# Patient Record
Sex: Female | Born: 1960 | Race: Black or African American | Hispanic: No | Smoking: Current every day smoker
Health system: Southern US, Community
[De-identification: ages and names within clinical notes are randomized; demographics above are authoritative.]

## PROBLEM LIST (undated history)

## (undated) DIAGNOSIS — F32A Depression, unspecified: Secondary | ICD-10-CM

## (undated) DIAGNOSIS — I10 Essential (primary) hypertension: Secondary | ICD-10-CM

## (undated) HISTORY — PX: BRAIN SURGERY: SHX531

## (undated) HISTORY — DX: Depression, unspecified: F32.A

## (undated) HISTORY — PX: TUBAL LIGATION: SHX77

## (undated) HISTORY — PX: EYE SURGERY: SHX253

---

## 2009-03-18 HISTORY — PX: BREAST BIOPSY: SHX20

## 2010-01-30 ENCOUNTER — Ambulatory Visit: Payer: Self-pay

## 2010-02-13 ENCOUNTER — Ambulatory Visit: Payer: Self-pay

## 2010-03-08 LAB — PATHOLOGY REPORT

## 2010-03-15 ENCOUNTER — Ambulatory Visit: Payer: Self-pay | Admitting: General Surgery

## 2010-09-06 ENCOUNTER — Ambulatory Visit: Payer: Self-pay

## 2011-03-05 ENCOUNTER — Ambulatory Visit: Payer: Self-pay

## 2011-04-10 ENCOUNTER — Emergency Department: Payer: Self-pay | Admitting: Emergency Medicine

## 2011-04-10 LAB — COMPREHENSIVE METABOLIC PANEL
Bilirubin,Total: 0.4 mg/dL (ref 0.2–1.0)
Chloride: 109 mmol/L — ABNORMAL HIGH (ref 98–107)
Co2: 24 mmol/L (ref 21–32)
Creatinine: 0.66 mg/dL (ref 0.60–1.30)
EGFR (African American): >60
EGFR (Non-African Amer.): >60
Osmolality: 288 (ref 275–301)
SGOT(AST): 55 U/L — ABNORMAL HIGH (ref 15–37)
SGPT (ALT): 71 U/L

## 2011-04-10 LAB — CBC
MCV: 98 fL (ref 80–100)
RBC: 4.05 10*6/uL (ref 3.80–5.20)
RDW: 14.5 % (ref 11.5–14.5)

## 2011-04-10 LAB — CK TOTAL AND CKMB (NOT AT ARMC)
CK, Total: 107 U/L (ref 21–215)
CK-MB: 1.3 ng/mL (ref 0.5–3.6)

## 2011-04-10 LAB — PRO B NATRIURETIC PEPTIDE: B-Type Natriuretic Peptide: 134 pg/mL — ABNORMAL HIGH (ref 0–125)

## 2011-08-08 IMAGING — US ULTRASOUND LEFT BREAST
1 series · 17 of 25 positions shown · non-contrast
Comparison: none

REASON FOR EXAM: LT NODULAR DENSITY [REDACTED] AV COPY TO JAVANNIE
COMMENTS:

PROCEDURE:     US  - US LT BREAST ([REDACTED])  - February 13, 2010  [DATE]
RESULT:

[Series 1: ultrasound left breast · 17 of 27 slices shown]
[im 1/27]
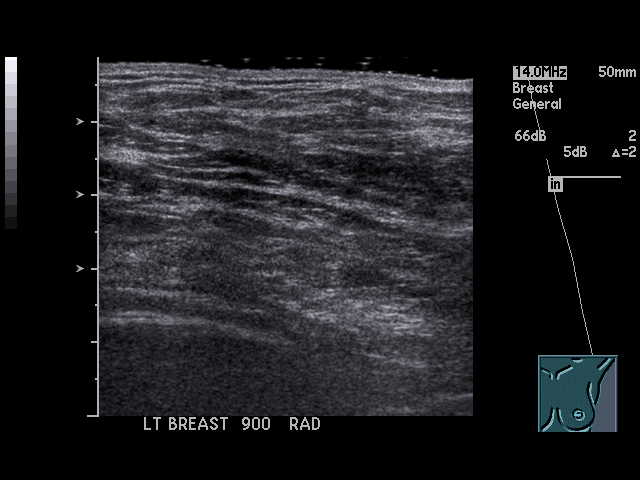
[im 3/27]
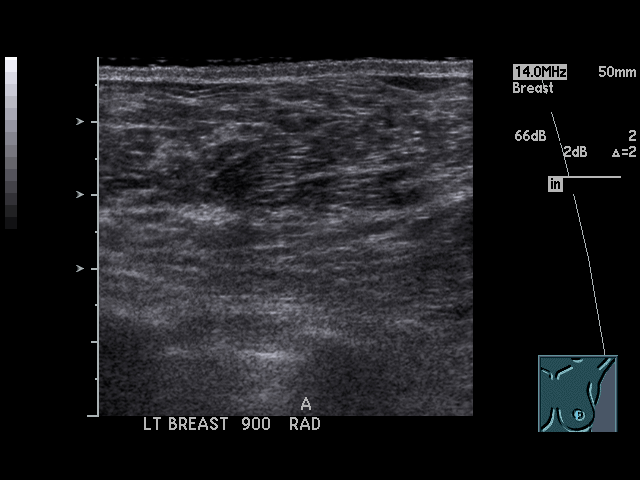
[im 4/27]
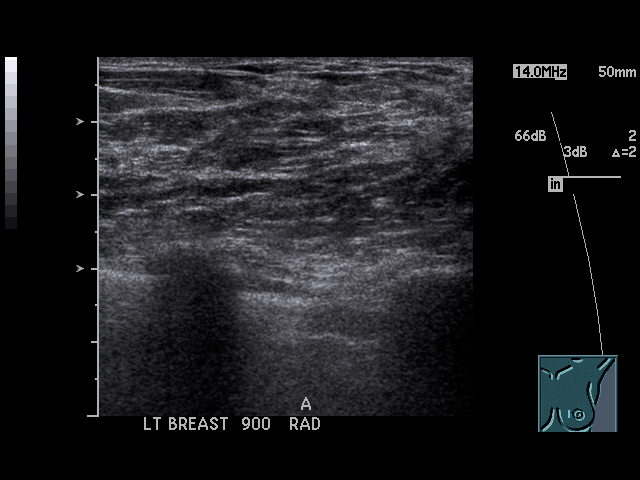
[im 6/27]
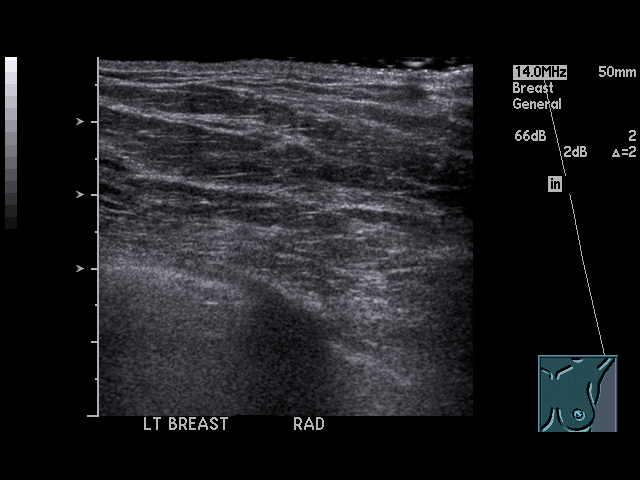
[im 7/27]
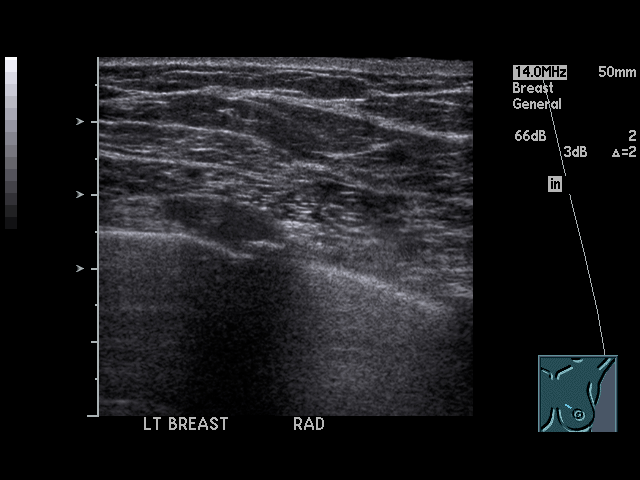
[im 9/27]
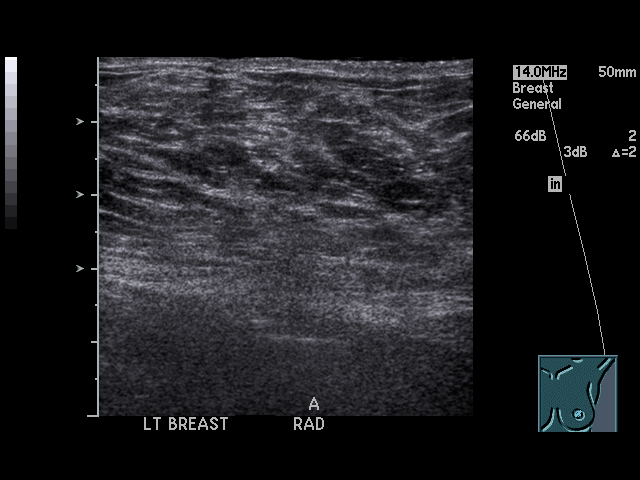
[im 10/27]
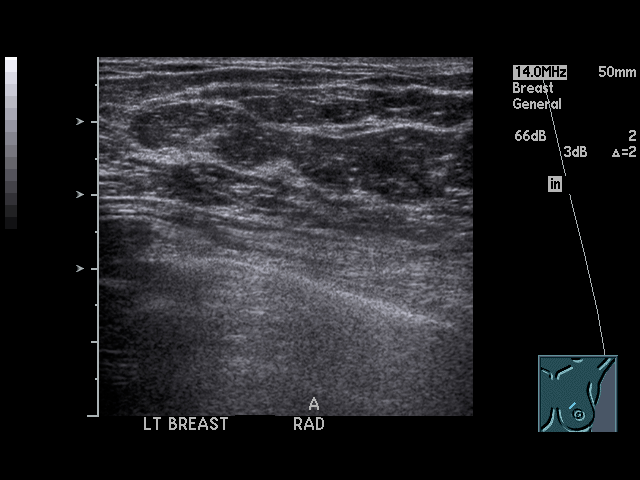
[im 12/27]
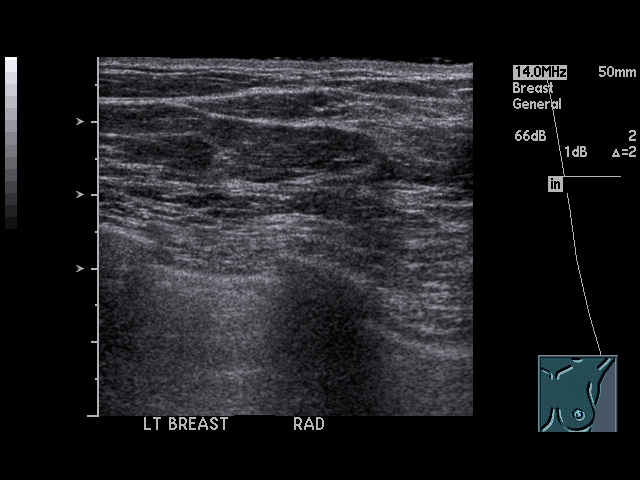
[im 14/27]
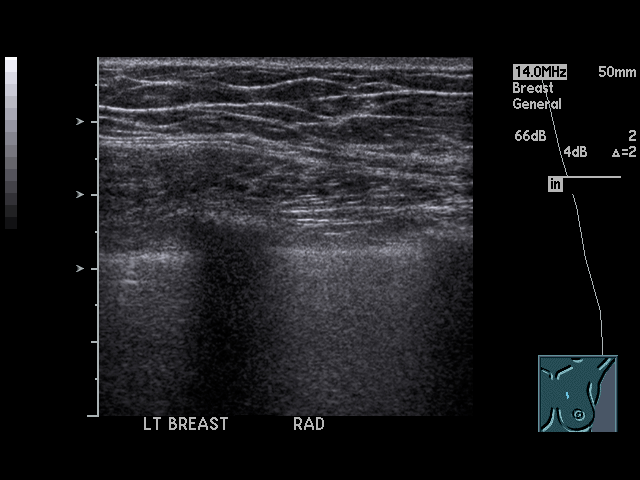
[im 15/27]
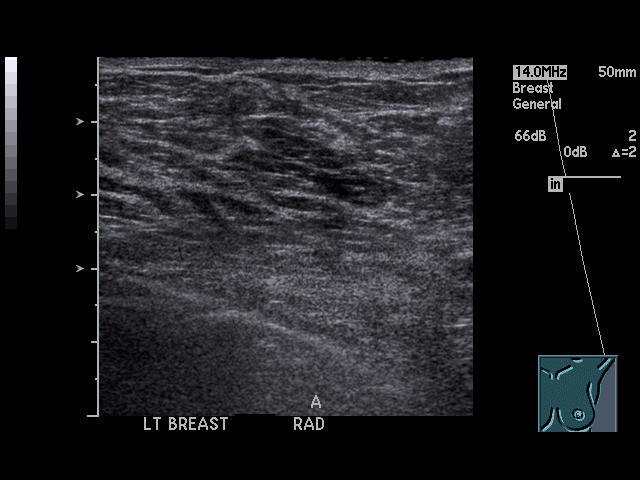
[im 17/27]
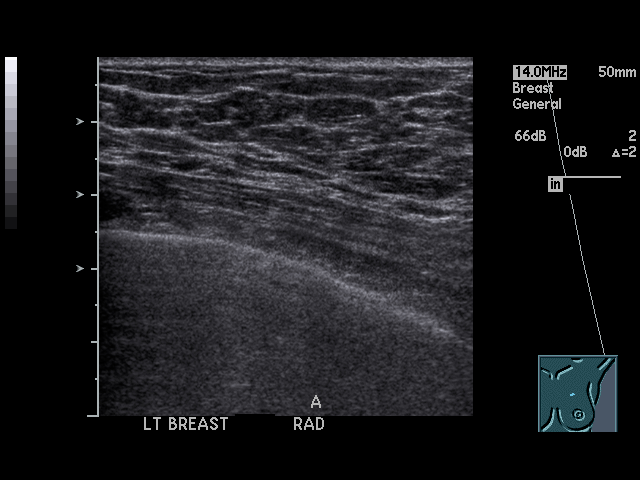
[im 18/27]
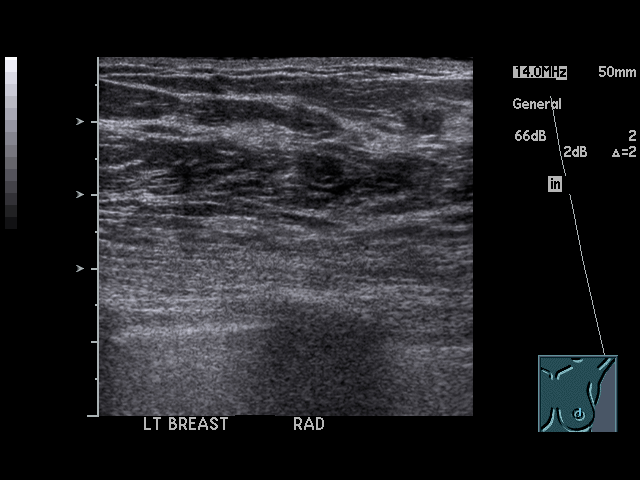
[im 20/27]
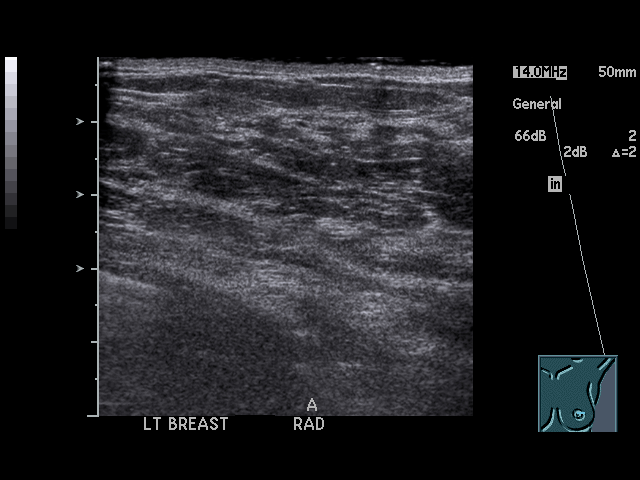
[im 21/27]
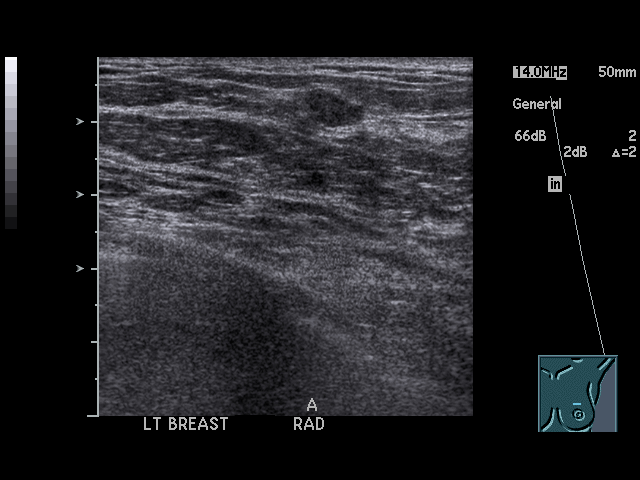
[im 23/27]
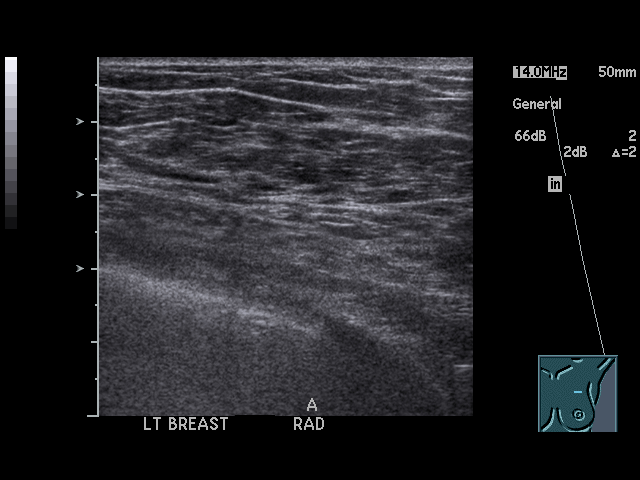
[im 24/27]
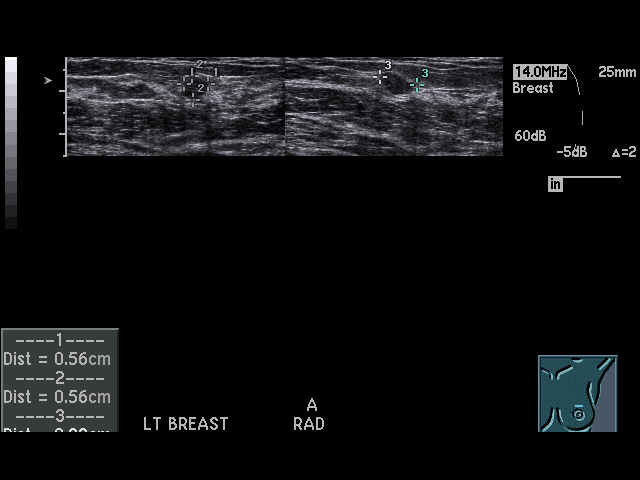
[im 27/27]
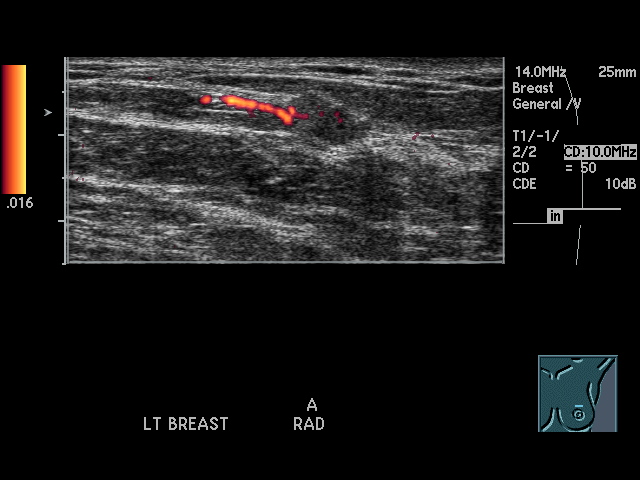

[17 of 25 positions shown; findings below may reference images not displayed]

FINDINGS: Ultrasound of the area of concern in the left breast shows a
x 0.56 x 0.88 cm, slightly irregular, hypoechoic mass in the 12 o'clock
position. There is no shadowing or through transmission and this may very
well be a benign mass. There is evidence of internal blood flow. Given the
slightly irregular margins, surgical consultation is recommended for
consideration of excision.
IMPRESSION: BI-RADS: Category 4 - Suspicious Abnormality

Thank you for this opportunity to contribute to the care of your patient.

A NEGATIVE MAMMOGRAM REPORT DOES NOT PRECLUDE BIOPSY OR OTHER EVALUATION OF
A CLINICALLY PALPABLE OR OTHERWISE SUSPICIOUS MASS OR LESION. BREAST CANCER
MAY NOT BE DETECTED BY MAMMOGRAPHY IN UP TO 10% OF CASES.

## 2011-09-07 IMAGING — US TRANSABDOMINAL ULTRASOUND OF PELVIS
1 series · 17 of 25 positions shown · non-contrast
Comparison: none

REASON FOR EXAM: postmenopausal bleeding
COMMENTS:

[Series 1: transabdominal ultrasound of pelvis · 17 of 78 slices shown]
[im 1/78]
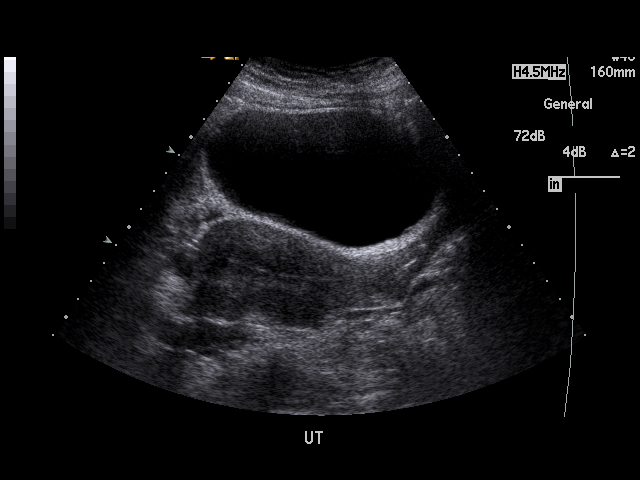
[im 7/78]
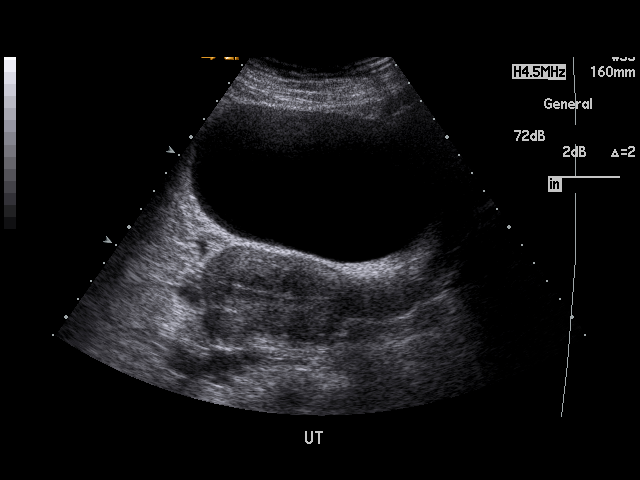
[im 10/78]
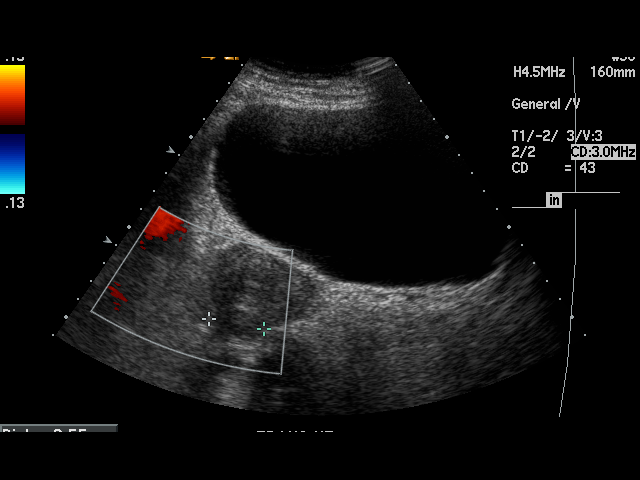
[im 17/78]
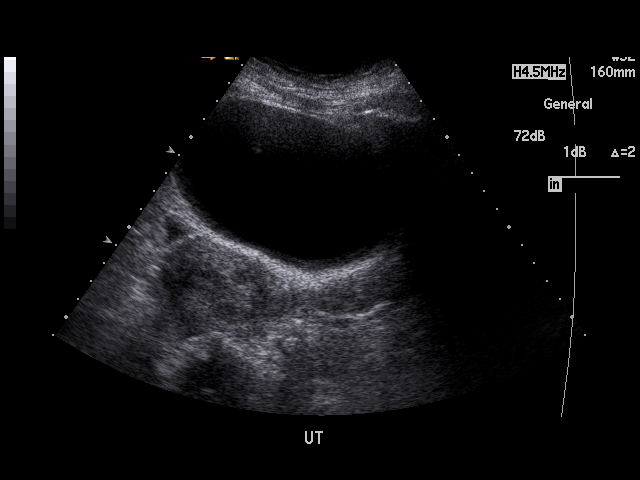
[im 20/78]
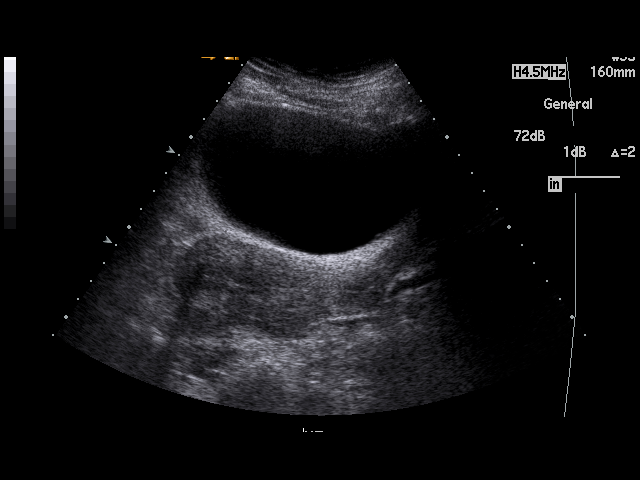
[im 26/78]
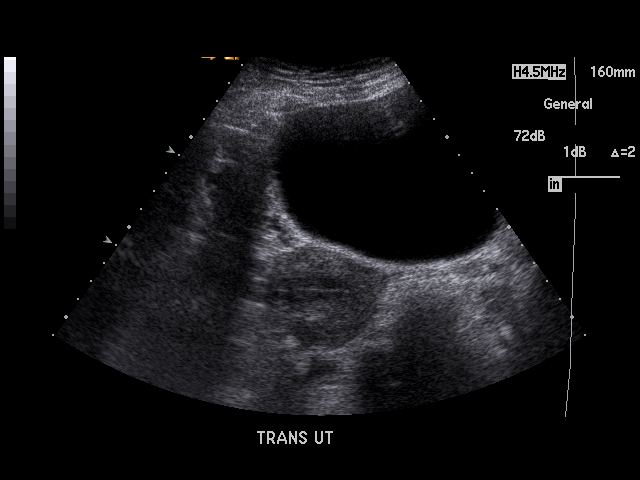
[im 29/78]
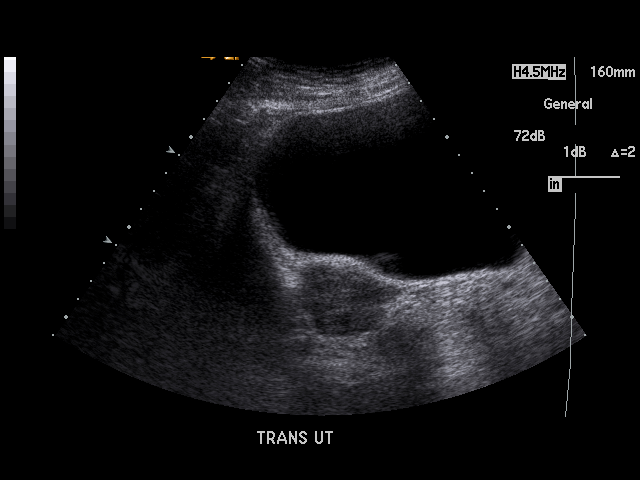
[im 36/78]
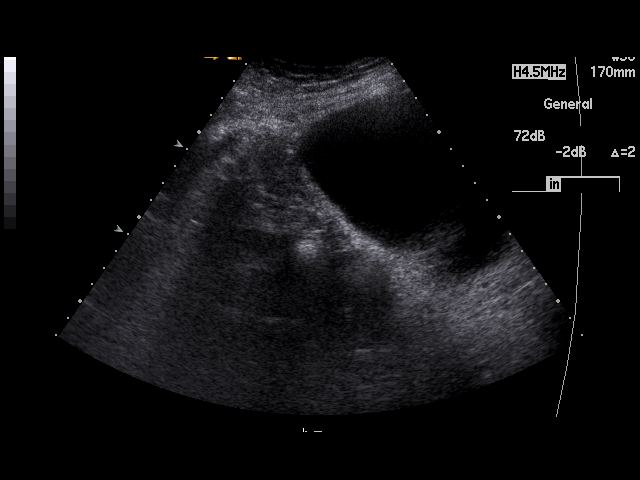
[im 39/78]
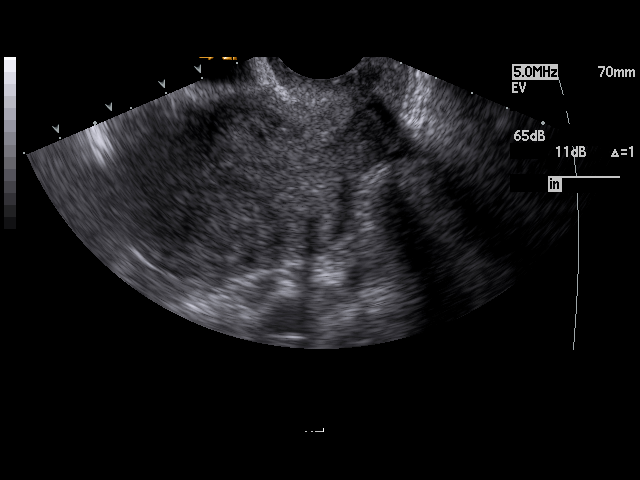
[im 42/78]
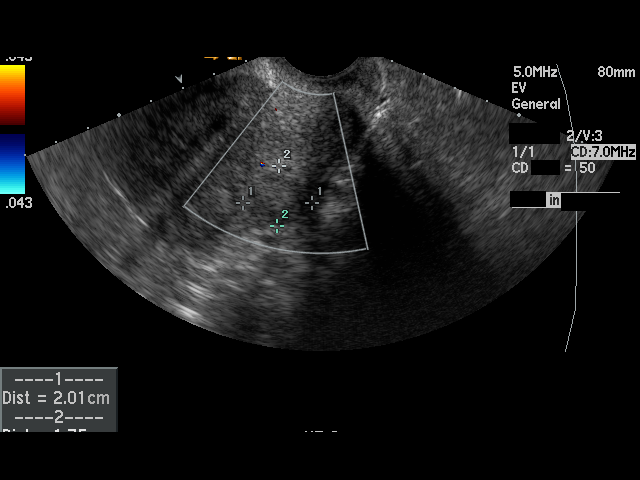
[im 49/78]
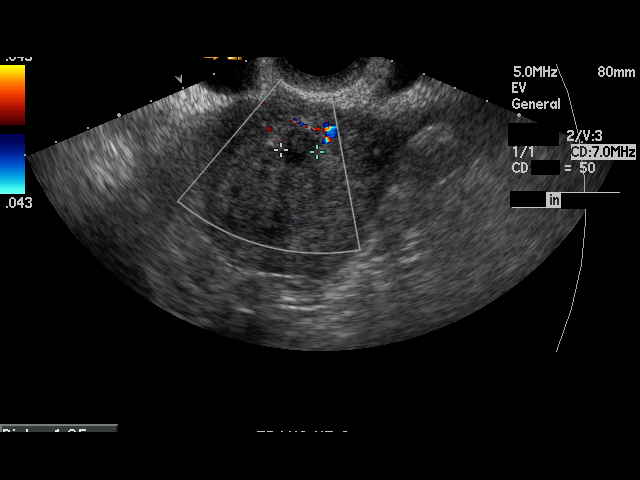
[im 52/78]
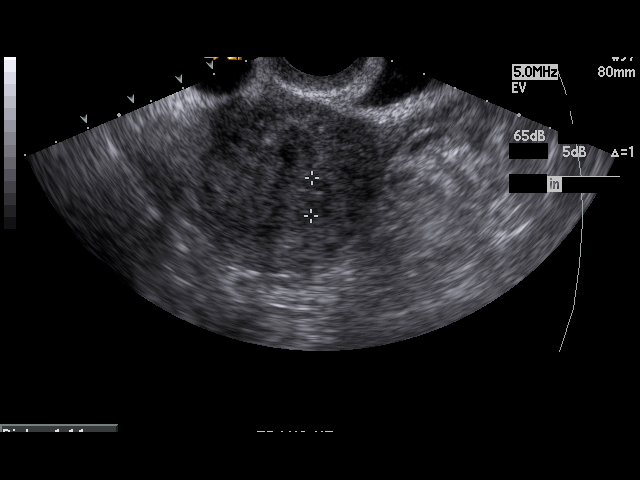
[im 58/78]
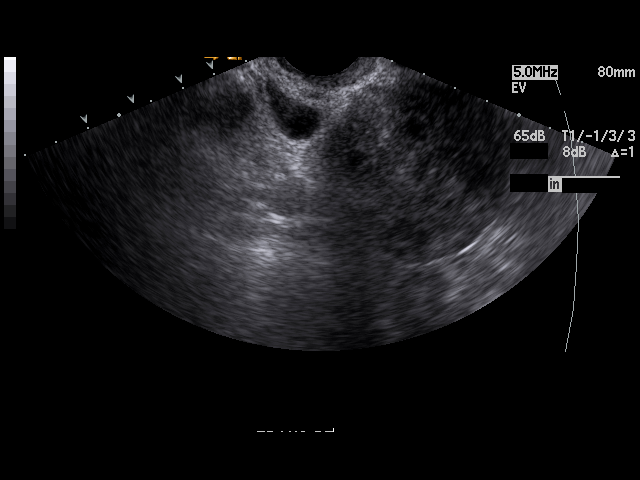
[im 61/78]
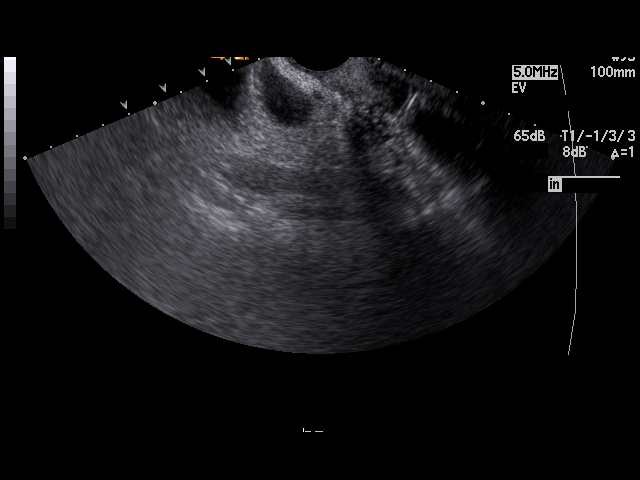
[im 68/78]
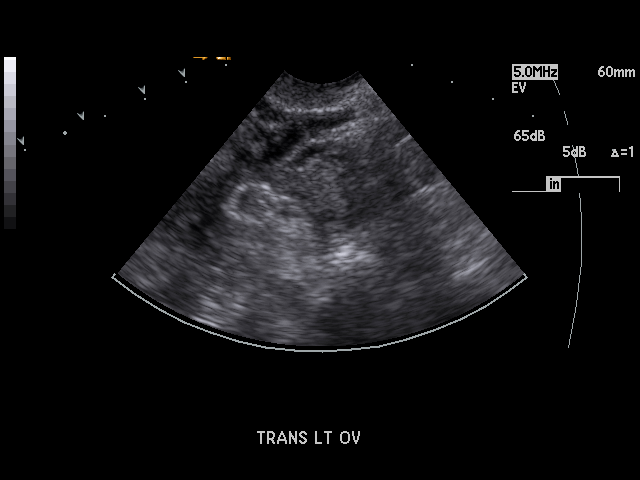
[im 71/78]
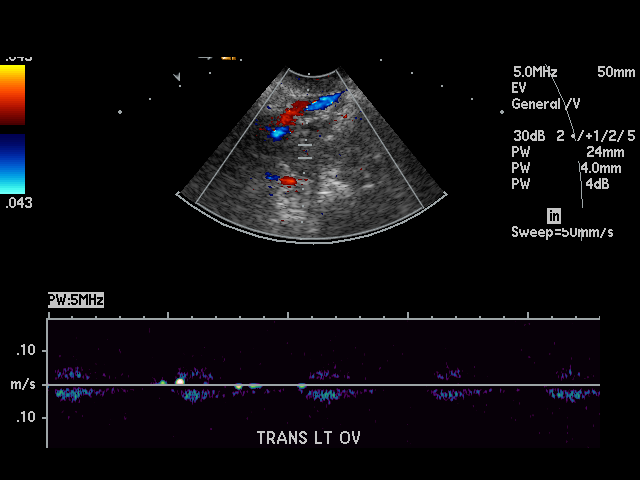
[im 78/78]
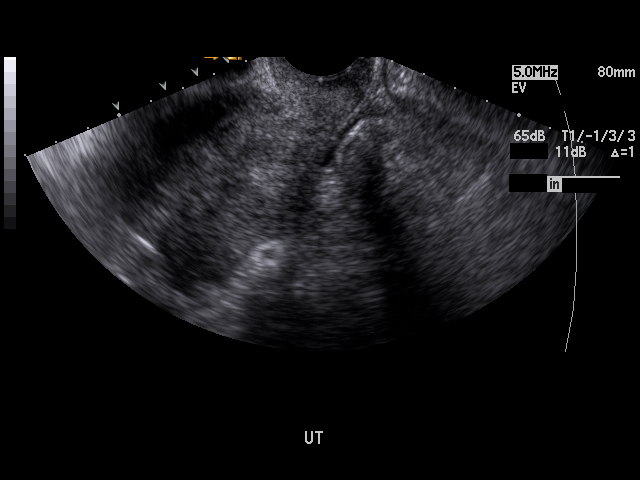

[17 of 25 positions shown; findings below may reference images not displayed]

PROCEDURE:     DANI BOYE - DANI BOYE PELVIS NON-OB W/TRANSVAGINAL  - March 15, 2010 [DATE]

RESULT:     Transabdominal and endovaginal ultrasound was performed. The
uterus measures 9.75 cm x 4.19 cm x 5.64 cm. The endometrium measures
cm in thickness. There are observed three uterine masses consistent with
uterine fibroids. The largest is partially exophytic and is visualized along
the posterior aspect of the uterine fundus and measures 2.22 cm at maximum
diameter. The measurements reported are from the transvaginal exam. The
transabdominal measurements were somewhat larger. The uterus has a
heterogeneous echotexture and additional smaller, non-defined fibroids may
also be present. The right ovary is not seen and apparently has been
removed, is atrophic or obscured. The left ovary is visualized and measures
3.09 cm at maximum diameter. Vascular flow is observed in the left ovary on
Doppler examination. No abnormal adnexal masses are seen. There is a
nonspecific trace of free fluid in the cul-de-sac. The visualized portion of
the urinary bladder is normal in appearance.
IMPRESSION: 1. There are noted at least three uterine masses consistent with uterine
fibroids as report above.
2. The right ovary is not visualized.
3. No abnormal adnexal masses are seen.
4. There is a nonspecific trace of free fluid in the cul-de-sac.

## 2013-05-26 ENCOUNTER — Emergency Department: Payer: Self-pay

## 2013-05-26 ENCOUNTER — Ambulatory Visit: Payer: Self-pay

## 2013-05-26 LAB — CBC WITH DIFFERENTIAL/PLATELET
BASOS ABS: 0 10*3/uL (ref 0.0–0.1)
BASOS PCT: 0.6 %
EOS ABS: 0.1 10*3/uL (ref 0.0–0.7)
EOS PCT: 1.2 %
HCT: 40.5 % (ref 35.0–47.0)
HGB: 13.5 g/dL (ref 12.0–16.0)
LYMPHS PCT: 52.5 %
Lymphocyte #: 3.5 10*3/uL (ref 1.0–3.6)
MCH: 32.9 pg (ref 26.0–34.0)
MCHC: 33.3 g/dL (ref 32.0–36.0)
MCV: 99 fL (ref 80–100)
Monocyte #: 0.6 x10 3/mm (ref 0.2–0.9)
Monocyte %: 9.3 %
NEUTROS ABS: 2.4 10*3/uL (ref 1.4–6.5)
Neutrophil %: 36.4 %
PLATELETS: 193 10*3/uL (ref 150–440)
RBC: 4.1 10*6/uL (ref 3.80–5.20)
RDW: 15.1 % — ABNORMAL HIGH (ref 11.5–14.5)
WBC: 6.6 10*3/uL (ref 3.6–11.0)

## 2013-05-26 LAB — BASIC METABOLIC PANEL
Anion Gap: 4 — ABNORMAL LOW (ref 7–16)
BUN: 15 mg/dL (ref 7–18)
CHLORIDE: 109 mmol/L — AB (ref 98–107)
Calcium, Total: 8.7 mg/dL (ref 8.5–10.1)
Co2: 26 mmol/L (ref 21–32)
Creatinine: 0.78 mg/dL (ref 0.60–1.30)
EGFR (African American): >60
EGFR (Non-African Amer.): >60
Glucose: 76 mg/dL (ref 65–99)
Osmolality: 277 (ref 275–301)
Potassium: 4.1 mmol/L (ref 3.5–5.1)
SODIUM: 139 mmol/L (ref 136–145)

## 2013-05-26 LAB — TROPONIN I

## 2013-07-14 ENCOUNTER — Ambulatory Visit: Payer: Self-pay

## 2014-07-01 ENCOUNTER — Other Ambulatory Visit: Payer: Self-pay | Admitting: *Deleted

## 2014-07-01 ENCOUNTER — Other Ambulatory Visit: Payer: Self-pay | Admitting: Oncology

## 2014-07-01 DIAGNOSIS — Z1231 Encounter for screening mammogram for malignant neoplasm of breast: Secondary | ICD-10-CM

## 2014-08-05 ENCOUNTER — Other Ambulatory Visit: Payer: Self-pay | Admitting: Oncology

## 2014-08-05 DIAGNOSIS — Z1231 Encounter for screening mammogram for malignant neoplasm of breast: Secondary | ICD-10-CM

## 2014-08-08 ENCOUNTER — Emergency Department
Admission: EM | Admit: 2014-08-08 | Discharge: 2014-08-08 | Disposition: A | Discharge status: Home / Self Care | Payer: Self-pay | Attending: Emergency Medicine | Admitting: Emergency Medicine

## 2014-08-08 ENCOUNTER — Ambulatory Visit
Admission: RE | Admit: 2014-08-08 | Discharge: 2014-08-08 | Disposition: A | Discharge status: Home / Self Care | Payer: Self-pay | Source: Ambulatory Visit | Attending: Oncology | Admitting: Oncology

## 2014-08-08 ENCOUNTER — Encounter: Payer: Self-pay | Admitting: Emergency Medicine

## 2014-08-08 ENCOUNTER — Ambulatory Visit: Payer: Self-pay | Attending: Oncology | Admitting: *Deleted

## 2014-08-08 ENCOUNTER — Encounter: Payer: Self-pay | Admitting: *Deleted

## 2014-08-08 VITALS — BP 166/123 | HR 71 | Temp 96.7°F | Ht 68.11 in | Wt 184.7 lb

## 2014-08-08 DIAGNOSIS — Z0000 Encounter for general adult medical examination without abnormal findings: Secondary | ICD-10-CM

## 2014-08-08 DIAGNOSIS — I10 Essential (primary) hypertension: Secondary | ICD-10-CM | POA: Insufficient documentation

## 2014-08-08 DIAGNOSIS — Z1231 Encounter for screening mammogram for malignant neoplasm of breast: Secondary | ICD-10-CM

## 2014-08-08 HISTORY — DX: Essential (primary) hypertension: I10

## 2014-08-08 LAB — COMPREHENSIVE METABOLIC PANEL
ALK PHOS: 92 U/L (ref 38–126)
ALT: 43 U/L (ref 14–54)
ANION GAP: 10 (ref 5–15)
AST: 39 U/L (ref 15–41)
Albumin: 3.9 g/dL (ref 3.5–5.0)
BILIRUBIN TOTAL: 0.5 mg/dL (ref 0.3–1.2)
BUN: 15 mg/dL (ref 6–20)
CALCIUM: 9.2 mg/dL (ref 8.9–10.3)
CO2: 26 mmol/L (ref 22–32)
Chloride: 102 mmol/L (ref 101–111)
Creatinine, Ser: 0.76 mg/dL (ref 0.44–1.00)
GFR calc Af Amer: >60 mL/min (ref >60)
GLUCOSE: 99 mg/dL (ref 65–99)
Potassium: 4.1 mmol/L (ref 3.5–5.1)
SODIUM: 138 mmol/L (ref 135–145)
Total Protein: 7.8 g/dL (ref 6.5–8.1)

## 2014-08-08 LAB — CBC
HCT: 42.7 % (ref 35.0–47.0)
Hemoglobin: 14.1 g/dL (ref 12.0–16.0)
MCH: 33.2 pg (ref 26.0–34.0)
MCHC: 33 g/dL (ref 32.0–36.0)
MCV: 100.7 fL — ABNORMAL HIGH (ref 80.0–100.0)
Platelets: 198 10*3/uL (ref 150–440)
RBC: 4.24 MIL/uL (ref 3.80–5.20)
RDW: 13.8 % (ref 11.5–14.5)
WBC: 4.8 10*3/uL (ref 3.6–11.0)

## 2014-08-08 LAB — TROPONIN I: Troponin I: <0.03 ng/mL (ref <0.031)

## 2014-08-08 MED ORDER — HYDROCHLOROTHIAZIDE 25 MG PO TABS: 25.0000 mg | ORAL_TABLET | Freq: Every day | ORAL | Status: DC

## 2014-08-08 NOTE — Progress Notes (Signed)
Subjective:     Patient ID: [name], female   DOB: [birth year], 54 y.o.   MRN: [id number]  HPI   Review of Systems     Objective:   Physical Exam  Pulmonary/Chest: Right breast exhibits no inverted nipple, no mass, no nipple discharge, no skin change and no tenderness. Left breast exhibits no inverted nipple, no mass, no nipple discharge, no skin change and no tenderness. Breasts are symmetrical.         Assessment:     54 year [facility] Black [facility]  She has no insurance, Medicare or Medicaid.  She meets eligibility for [facility].  Hand-out given on the the Affordable Care Act.  Clinical breast exam unremarkable.  Taught self breast awareness.  Blood pressure elevated at 163/123. Since the patient has no primary care provider or blood pressure meds, she was escorted to the ED by C. Burton for further evaluation.  Hand out on hypertention given to patient.    Plan:     Screening mammogram ordered and rescheduled for 08/18/14 @ 9:00.  Paper work for [employer] given to patient to complete for medication assistance.  Follow up per protocol.

## 2014-08-08 NOTE — ED Provider Notes (Signed)
[facility] Emergency Department Provider Note  ____________________________________________  Time seen: 12:25 PM  I have reviewed the triage vital signs and the nursing notes.   HISTORY  Chief Complaint Hypertension    HPI [name] is a 54 y.o. female who is sent to the ED from mammography today due to an elevated blood pressure reading. She has absolutely no symptoms. No chest pain, shortness of breath, headache, abdominal pain, back pain, syncope or dizziness. No numbness, tingling or weakness. She does note a history of hypertension but is been off her medications for the past year. She notes that she previously took a combination pill that had a blood pressure medicine and a diuretic in it. Besides hypertension no other medical problems or surgeries. She is currently not taking any medications and has no allergies.     Past Medical History  Diagnosis Date  . Hypertension     There are no active problems to display for this patient.   No past surgical history on file.  No current outpatient prescriptions on file.  Allergies Review of patient's allergies indicates no known allergies.  No family history on file.  Social History History  Substance Use Topics  . Smoking status: Not on file  . Smokeless tobacco: Not on file  . Alcohol Use: Not on file    Review of Systems  Constitutional: No fever or chills. No weight changes Eyes:No blurry vision or double vision.  ENT: No sore throat. Cardiovascular: No chest pain. Respiratory: No dyspnea or cough. Gastrointestinal: Negative for abdominal pain, vomiting and diarrhea.  No BRBPR or melena. Genitourinary: Negative for dysuria, urinary retention, bloody urine, or difficulty urinating. Musculoskeletal: Negative for back pain. No joint swelling or pain. Skin: Negative for rash. Neurological: Negative for headaches, focal weakness or numbness. Psychiatric:No anxiety or  depression.   Endocrine:No hot/cold intolerance, changes in energy, or sleep difficulty.  10-point ROS otherwise negative.  ____________________________________________   PHYSICAL EXAM:  VITAL SIGNS: ED Triage Vitals  Enc Vitals Group     BP 08/08/14 1014 168/101 mmHg     Pulse --      Resp --      Temp 08/08/14 1014 98.2 F (36.8 C)     Temp Source 08/08/14 1014 Oral     SpO2 08/08/14 1014 99 %     Weight 08/08/14 1014 170 lb (77.111 kg)     Height 08/08/14 1014  (1.778 m)     Head Cir --      Peak Flow --      Pain Score 08/08/14 1026 0     Pain Loc --      Pain Edu? --      Excl. in GC? --      Constitutional: Alert and oriented. Well appearing and in no distress. Eyes: No scleral icterus. No conjunctival pallor. PERRL. EOMI ENT   Head: Normocephalic and atraumatic.   Nose: No congestion/rhinnorhea. No septal hematoma   Mouth/Throat: MMM, no pharyngeal erythema. No peritonsillar mass. No uvula shift.   Neck: No stridor. No SubQ emphysema. No meningismus. Hematological/Lymphatic/Immunilogical: No cervical lymphadenopathy. Cardiovascular: RRR. Normal and symmetric distal pulses are present in all extremities. No murmurs, rubs, or gallops. Respiratory: Normal respiratory effort without tachypnea nor retractions. Breath sounds are clear and equal bilaterally. No wheezes/rales/rhonchi. Gastrointestinal: Soft and nontender. No distention. There is no CVA tenderness.  No rebound, rigidity, or guarding. Genitourinary: deferred Musculoskeletal: Nontender with normal range of motion in all extremities. No  joint effusions.  No lower extremity tenderness.  No edema. Neurologic:   Normal speech and language.  CN 2-10 normal. Motor grossly intact. No pronator drift.  Normal gait. No gross focal neurologic deficits are appreciated.  Skin:  Skin is warm, dry and intact. No rash noted.  No petechiae, purpura, or bullae. Psychiatric: Mood and affect are normal.  Speech and behavior are normal. Patient exhibits appropriate insight and judgment.  ____________________________________________    LABS (pertinent positives/negatives) (all labs ordered are listed, but only abnormal results are displayed) Labs Reviewed  CBC - Abnormal; Notable for the following:    MCV 100.7 (*)    All other components within normal limits  COMPREHENSIVE METABOLIC PANEL  TROPONIN I   ____________________________________________   EKG  Interpreted by me. Normal sinus rhythm, rate of 63, normal axis and intervals, normal QRS, normal ST segments. Isolated T-wave inversion in lead 3, which is nonspecific. Voltage criteria for LVH  ____________________________________________    RADIOLOGY    ____________________________________________   PROCEDURES  ____________________________________________   INITIAL IMPRESSION / ASSESSMENT AND PLAN / ED COURSE  Pertinent labs & imaging results that were available during my care of the patient were reviewed by me and considered in my medical decision making (see chart for details).  Patient presents with asymptomatic. Essential hypertension that is elevated due to medication noncompliance. No evidence of any cardio pulmonary pathology including PE, ACS, TAD, pneumothorax, pneumonia or underlying tumor. We will restart the person on HCTZ. She is currently attempting to get enrolled in the medication management clinic and does request assistance with filling out an faxing these paperwork. I referred her to our case management team to assess the patient.  ____________________________________________   FINAL CLINICAL IMPRESSION(S) / ED DIAGNOSES  Final diagnoses:  None   asymptomatic. Essential hypertension    Sharman CheekPhillip Lavonta Tillis, MD 08/08/14 1257

## 2014-08-08 NOTE — Care Management (Signed)
Provided patient with Medication Management application.  Patient unable to read.   Assisted with completion of application.  Instructed patient to return completed packet to Medication Management.

## 2014-08-08 NOTE — ED Notes (Signed)
States has been off blood pressure meds x 1 year, history of hypertension, recheck blood pressure in triage with arm relaxed, noted below.

## 2014-08-08 NOTE — Discharge Instructions (Signed)

## 2014-08-18 ENCOUNTER — Ambulatory Visit
Admission: RE | Admit: 2014-08-18 | Discharge: 2014-08-18 | Disposition: A | Discharge status: Home / Self Care | Payer: Self-pay | Source: Ambulatory Visit | Attending: Oncology | Admitting: Oncology

## 2014-08-18 DIAGNOSIS — Z0000 Encounter for general adult medical examination without abnormal findings: Secondary | ICD-10-CM

## 2014-08-19 ENCOUNTER — Other Ambulatory Visit: Payer: Self-pay | Admitting: *Deleted

## 2014-08-19 DIAGNOSIS — R928 Other abnormal and inconclusive findings on diagnostic imaging of breast: Secondary | ICD-10-CM

## 2014-08-19 DIAGNOSIS — N6489 Other specified disorders of breast: Secondary | ICD-10-CM

## 2014-08-22 ENCOUNTER — Ambulatory Visit
Admission: RE | Admit: 2014-08-22 | Discharge: 2014-08-22 | Disposition: A | Discharge status: Home / Self Care | Payer: Self-pay | Source: Ambulatory Visit | Attending: Oncology | Admitting: Oncology

## 2014-08-22 ENCOUNTER — Emergency Department
Admission: EM | Admit: 2014-08-22 | Discharge: 2014-08-22 | Disposition: A | Discharge status: Home / Self Care | Payer: Self-pay | Attending: Emergency Medicine | Admitting: Emergency Medicine

## 2014-08-22 ENCOUNTER — Ambulatory Visit: Payer: Self-pay

## 2014-08-22 ENCOUNTER — Encounter: Payer: Self-pay | Admitting: *Deleted

## 2014-08-22 ENCOUNTER — Encounter (INDEPENDENT_AMBULATORY_CARE_PROVIDER_SITE_OTHER): Payer: Self-pay

## 2014-08-22 DIAGNOSIS — Z79899 Other long term (current) drug therapy: Secondary | ICD-10-CM | POA: Insufficient documentation

## 2014-08-22 DIAGNOSIS — N6489 Other specified disorders of breast: Secondary | ICD-10-CM

## 2014-08-22 DIAGNOSIS — Y998 Other external cause status: Secondary | ICD-10-CM | POA: Insufficient documentation

## 2014-08-22 DIAGNOSIS — Y9389 Activity, other specified: Secondary | ICD-10-CM | POA: Insufficient documentation

## 2014-08-22 DIAGNOSIS — S80862A Insect bite (nonvenomous), left lower leg, initial encounter: Secondary | ICD-10-CM | POA: Insufficient documentation

## 2014-08-22 DIAGNOSIS — I10 Essential (primary) hypertension: Secondary | ICD-10-CM | POA: Insufficient documentation

## 2014-08-22 DIAGNOSIS — W57XXXA Bitten or stung by nonvenomous insect and other nonvenomous arthropods, initial encounter: Secondary | ICD-10-CM | POA: Insufficient documentation

## 2014-08-22 DIAGNOSIS — Y92830 Public park as the place of occurrence of the external cause: Secondary | ICD-10-CM | POA: Insufficient documentation

## 2014-08-22 DIAGNOSIS — Z720 Tobacco use: Secondary | ICD-10-CM | POA: Insufficient documentation

## 2014-08-22 DIAGNOSIS — R928 Other abnormal and inconclusive findings on diagnostic imaging of breast: Secondary | ICD-10-CM

## 2014-08-22 DIAGNOSIS — S80861A Insect bite (nonvenomous), right lower leg, initial encounter: Secondary | ICD-10-CM | POA: Insufficient documentation

## 2014-08-22 MED ORDER — TRIAMCINOLONE ACETONIDE 0.5 % EX OINT: 1.0000 "application " | TOPICAL_OINTMENT | Freq: Two times a day (BID) | CUTANEOUS | Status: DC

## 2014-08-22 NOTE — Progress Notes (Signed)
Letter mailed from [facility] to inform patient of her normal mammogram results.  Patient is to follow-up with annual screening in one year. 

## 2014-08-22 NOTE — ED Notes (Signed)
Pt states that she was in a park Saturday and something bit her on the back of her legs. The site is itching and burning  and is red and swollen.

## 2014-08-22 NOTE — ED Provider Notes (Signed)
[facility] Emergency Department Provider Note  ____________________________________________  Time seen: Approximately 9:20 AM  I have reviewed the triage vital signs and the nursing notes.   HISTORY  Chief Complaint Insect Bite    HPI [name] is a 54 y.o. female who presents to the emergency department for insect bites to the back of both legs. She states that she was in a park at a family reunion and assumes that she was bitten the insects there. She is unsure of the type of insect.   Past Medical History  Diagnosis Date  . Hypertension     There are no active problems to display for this patient.   Past Surgical History  Procedure Laterality Date  . Breast biopsy Left 2011    CORE W/CLIP    Current Outpatient Rx  Name  Route  Sig  Dispense  Refill  . hydrochlorothiazide (HYDRODIURIL) 25 MG tablet   Oral   Take 1 tablet (25 mg total) by mouth daily.   30 tablet   0   . triamcinolone ointment (KENALOG) 0.5 %   Topical   Apply 1 application topically 2 (two) times daily.   30 g   0     Allergies Review of patient's allergies indicates no known allergies.  No family history on file.  Social History History  Substance Use Topics  . Smoking status: Current Every Day Smoker  . Smokeless tobacco: Not on file  . Alcohol Use: No    Review of Systems   Constitutional: No fever/chills Eyes: No visual changes. ENT: No rhinorrhea or sore throat Cardiovascular: Denies chest pain. Respiratory: Denies shortness of breath. Gastrointestinal: No abdominal pain.  No nausea, no vomiting.  No diarrhea.  No constipation. Genitourinary: Negative for dysuria. Musculoskeletal: Negative for back pain. Skin: Insect bites to the back of both legs. No rash  Neurological: Negative for headaches, focal weakness or numbness.  10-point ROS otherwise negative.  ____________________________________________   PHYSICAL  EXAM:  VITAL SIGNS: ED Triage Vitals  Enc Vitals Group     BP 08/22/14 0850 114/114 mmHg     Pulse Rate 08/22/14 0850 77     Resp --      Temp 08/22/14 0850 98.7 F (37.1 C)     Temp Source 08/22/14 0850 Oral     SpO2 08/22/14 0850 97 %     Weight 08/22/14 0850 175 lb (79.379 kg)     Height 08/22/14 0850  (1.702 m)     Head Cir --      Peak Flow --      Pain Score 08/22/14 0850 6     Pain Loc --      Pain Edu? --      Excl. in GC? --     Constitutional: Alert and oriented. Well appearing and in no acute distress. Eyes: Conjunctivae are normal. PERRL. EOMI. Head: Atraumatic. Nose: No congestion/rhinnorhea. Mouth/Throat: Mucous membranes are moist.  Oropharynx non-erythematous. No oral lesions. Neck: No stridor. Cardiovascular: Normal rate, regular rhythm.  Good peripheral circulation. Respiratory: Normal respiratory effort.  No retractions. Lungs CTAB. Gastrointestinal: Soft and nontender. No distention. No abdominal bruits.  Musculoskeletal: No lower extremity tenderness nor edema.  No joint effusions. Neurologic:  Normal speech and language. No gross focal neurologic deficits are appreciated. Speech is normal. No gait instability. Skin:  Rash no; Erythema to bilateral posterior upper calves. Vesicles noted in area of erythema. No induration or fluctuance, no indication of cellulitis at  this time; Negative for petechiae.  Psychiatric: Mood and affect are normal. Speech and behavior are normal.  ____________________________________________   LABS (all labs ordered are listed, but only abnormal results are displayed)  Labs Reviewed - No data to display ____________________________________________  EKG   ____________________________________________  RADIOLOGY   ____________________________________________   PROCEDURES  Procedure(s) performed: None ____________________________________________   INITIAL IMPRESSION / ASSESSMENT AND PLAN / ED  COURSE  Pertinent labs & imaging results that were available during my care of the patient were reviewed by me and considered in my medical decision making (see chart for details).  Patient was advised to use the ointment as prescribed. She is advised follow-up with her primary care provider or return to the emergency department for symptoms that change or worsen.  See ED vital flowsheet for accurate blood pressure reading. Patient states that she has not taken her blood pressure medication this morning. She is asymptomatic. She was advised that she would need to take her medication and follow-up with the prescriber to discuss medication management. ____________________________________________   FINAL CLINICAL IMPRESSION(S) / ED DIAGNOSES  Final diagnoses:  Insect bite of leg, left, initial encounter  Insect bite of leg, right, initial encounter      Chinita PesterCari B Wyatt Thorstenson, FNP 08/22/14 [id number]  Sharyn CreamerMark Quale, MD 08/22/14 [phone]

## 2014-08-28 ENCOUNTER — Other Ambulatory Visit: Payer: Self-pay

## 2014-08-28 ENCOUNTER — Emergency Department
Admission: EM | Admit: 2014-08-28 | Discharge: 2014-08-28 | Disposition: A | Discharge status: Home / Self Care | Payer: Self-pay | Attending: Emergency Medicine | Admitting: Emergency Medicine

## 2014-08-28 DIAGNOSIS — K0381 Cracked tooth: Secondary | ICD-10-CM | POA: Insufficient documentation

## 2014-08-28 DIAGNOSIS — K0889 Other specified disorders of teeth and supporting structures: Secondary | ICD-10-CM

## 2014-08-28 DIAGNOSIS — Z79899 Other long term (current) drug therapy: Secondary | ICD-10-CM | POA: Insufficient documentation

## 2014-08-28 DIAGNOSIS — I10 Essential (primary) hypertension: Secondary | ICD-10-CM | POA: Insufficient documentation

## 2014-08-28 DIAGNOSIS — K047 Periapical abscess without sinus: Secondary | ICD-10-CM | POA: Insufficient documentation

## 2014-08-28 DIAGNOSIS — Z720 Tobacco use: Secondary | ICD-10-CM | POA: Insufficient documentation

## 2014-08-28 DIAGNOSIS — K029 Dental caries, unspecified: Secondary | ICD-10-CM | POA: Insufficient documentation

## 2014-08-28 LAB — CBC WITH DIFFERENTIAL/PLATELET
BASOS ABS: 0.3 10*3/uL — AB (ref 0–0.1)
Basophils Relative: 3 %
EOS PCT: 1 %
Eosinophils Absolute: 0.1 10*3/uL (ref 0–0.7)
HEMATOCRIT: 43.4 % (ref 35.0–47.0)
Hemoglobin: 14.2 g/dL (ref 12.0–16.0)
Lymphocytes Relative: 30 %
Lymphs Abs: 2.6 10*3/uL (ref 1.0–3.6)
MCH: 33.1 pg (ref 26.0–34.0)
MCHC: 32.9 g/dL (ref 32.0–36.0)
MCV: 100.9 fL — ABNORMAL HIGH (ref 80.0–100.0)
MONO ABS: 0.6 10*3/uL (ref 0.2–0.9)
Monocytes Relative: 7 %
NEUTROS ABS: 5.1 10*3/uL (ref 1.4–6.5)
Neutrophils Relative %: 59 %
Platelets: 252 10*3/uL (ref 150–440)
RBC: 4.3 MIL/uL (ref 3.80–5.20)
RDW: 13.8 % (ref 11.5–14.5)
WBC: 8.6 10*3/uL (ref 3.6–11.0)

## 2014-08-28 LAB — COMPREHENSIVE METABOLIC PANEL
ALBUMIN: 3.7 g/dL (ref 3.5–5.0)
ALK PHOS: 102 U/L (ref 38–126)
ALT: 45 U/L (ref 14–54)
AST: 55 U/L — ABNORMAL HIGH (ref 15–41)
Anion gap: 10 (ref 5–15)
BILIRUBIN TOTAL: 0.7 mg/dL (ref 0.3–1.2)
BUN: 12 mg/dL (ref 6–20)
CO2: 24 mmol/L (ref 22–32)
Calcium: 8.9 mg/dL (ref 8.9–10.3)
Chloride: 105 mmol/L (ref 101–111)
Creatinine, Ser: 0.81 mg/dL (ref 0.44–1.00)
GFR calc non Af Amer: >60 mL/min (ref >60)
Glucose, Bld: 85 mg/dL (ref 65–99)
Potassium: 4 mmol/L (ref 3.5–5.1)
SODIUM: 139 mmol/L (ref 135–145)
TOTAL PROTEIN: 7.8 g/dL (ref 6.5–8.1)

## 2014-08-28 LAB — TROPONIN I

## 2014-08-28 MED ORDER — OXYCODONE-ACETAMINOPHEN 5-325 MG PO TABS: 1.0000 | ORAL_TABLET | Freq: Three times a day (TID) | ORAL | Status: DC | PRN

## 2014-08-28 MED ORDER — OXYCODONE-ACETAMINOPHEN 5-325 MG PO TABS
ORAL_TABLET | ORAL | Status: AC
  Administered 2014-08-28: 1 via ORAL
  Filled 2014-08-28: qty 1

## 2014-08-28 MED ORDER — LISINOPRIL 10 MG PO TABS: 10.0000 mg | ORAL_TABLET | Freq: Every day | ORAL | Status: DC

## 2014-08-28 MED ORDER — MORPHINE SULFATE 4 MG/ML IJ SOLN
INTRAMUSCULAR | Status: AC
  Administered 2014-08-28: 4 mg via INTRAVENOUS
  Filled 2014-08-28: qty 1

## 2014-08-28 MED ORDER — LIDOCAINE VISCOUS 2 % MT SOLN
OROMUCOSAL | Status: AC
  Administered 2014-08-28: 15 mL via OROMUCOSAL
  Filled 2014-08-28: qty 15

## 2014-08-28 MED ORDER — PENICILLIN V POTASSIUM 500 MG PO TABS: 500.0000 mg | ORAL_TABLET | Freq: Four times a day (QID) | ORAL | Status: DC

## 2014-08-28 MED ORDER — OXYCODONE-ACETAMINOPHEN 5-325 MG PO TABS
1.0000 | ORAL_TABLET | Freq: Once | ORAL | Status: AC
  Administered 2014-08-28: 1 via ORAL

## 2014-08-28 MED ORDER — LIDOCAINE VISCOUS 2 % MT SOLN
15.0000 mL | Freq: Once | OROMUCOSAL | Status: AC
  Administered 2014-08-28: 15 mL via OROMUCOSAL

## 2014-08-28 MED ORDER — MORPHINE SULFATE 4 MG/ML IJ SOLN
4.0000 mg | Freq: Once | INTRAMUSCULAR | Status: AC
  Administered 2014-08-28: 4 mg via INTRAVENOUS

## 2014-08-28 MED ORDER — LISINOPRIL 10 MG PO TABS
ORAL_TABLET | ORAL | Status: AC
  Filled 2014-08-28: qty 2

## 2014-08-28 MED ORDER — PENICILLIN V POTASSIUM 500 MG PO TABS
500.0000 mg | ORAL_TABLET | Freq: Once | ORAL | Status: AC
  Administered 2014-08-28: 500 mg via ORAL

## 2014-08-28 MED ORDER — OXYCODONE-ACETAMINOPHEN 5-325 MG PO TABS: 1.0000 | ORAL_TABLET | Freq: Once | ORAL | Status: DC

## 2014-08-28 MED ORDER — PENICILLIN V POTASSIUM 500 MG PO TABS
ORAL_TABLET | ORAL | Status: AC
  Administered 2014-08-28: 500 mg via ORAL
  Filled 2014-08-28: qty 1

## 2014-08-28 MED ORDER — LISINOPRIL 10 MG PO TABS
20.0000 mg | ORAL_TABLET | Freq: Once | ORAL | Status: AC
  Administered 2014-08-28: 20 mg via ORAL

## 2014-08-28 NOTE — ED Provider Notes (Signed)
ED ECG REPORT I, Jene Every, the attending physician, personally viewed and interpreted this ECG.  Date: 08/28/2014 EKG Time: 10:43 AM Rate: 76 Rhythm: normal sinus rhythm QRS Axis: normal Intervals: normal ST/T Wave abnormalities: normal Conduction Disutrbances: none Narrative Interpretation: unremarkable   Jene Every, MD 08/28/14 1412

## 2014-08-28 NOTE — ED Provider Notes (Signed)
[facility] Emergency Department Provider Note  ____________________________________________  Time seen: Approximately 8:28 AM  I have reviewed the triage vital signs and the nursing notes.   HISTORY  Chief Complaint Dental Pain   HPI [name] is a 54 y.o. female presents to the ER for the complaint of right dental pain. Patient reports that she has had broken teeth to the right upper area for multiple years and has intermittent pain, however states that she has had more persistent pain for approximately 2 weeks. Patient states that for about a week she has had some right upper facial swelling adjacent to tooth pain. Patient states the pain is currently 7 out of 10 and aching. Patient states the pain occasionally shoots to her right ear. Denies fall or injury. Denies fever. Reports continues to eat and drink well.  Denies chest pain, shortness of breath, vision changes, headache, nausea, vomiting, diarrhea, decreased appetite. Reports that she had not taken her blood pressure medicine prior to arrival. The patient states that she just took it in room prior to my examination.    Past Medical History  Diagnosis Date  . Hypertension     There are no active problems to display for this patient.   Past Surgical History  Procedure Laterality Date  . Breast biopsy Left 2011    CORE W/CLIP  . Tubal ligation      Current Outpatient Rx  Name  Route  Sig  Dispense  Refill  . hydrochlorothiazide (HYDRODIURIL) 25 MG tablet   Oral   Take 1 tablet (25 mg total) by mouth daily.   30 tablet   0   . lisinopril (PRINIVIL,ZESTRIL) 10 MG tablet   Oral   Take 1 tablet (10 mg total) by mouth daily.   30 tablet   0   . oxyCODONE-acetaminophen (ROXICET) 5-325 MG per tablet   Oral   Take 1 tablet by mouth every 8 (eight) hours as needed for moderate pain or severe pain (Do not drive or operate heavy machinery while taking as can cause drowsiness.).  9 tablet   0   . penicillin v potassium (VEETID) 500 MG tablet   Oral   Take 1 tablet (500 mg total) by mouth 4 (four) times daily.   40 tablet   0   . triamcinolone ointment (KENALOG) 0.5 %   Topical   Apply 1 application topically 2 (two) times daily.   30 g   0     Allergies Review of patient's allergies indicates no known allergies.  No family history on file.  Social History History  Substance Use Topics  . Smoking status: Current Every Day Smoker  . Smokeless tobacco: Not on file  . Alcohol Use: No    Review of Systems Constitutional: No fever/chills Eyes: No visual changes. ENT: No sore throat. Positive for dental pain Cardiovascular: Denies chest pain. Respiratory: Denies shortness of breath. Gastrointestinal: No abdominal pain.  No nausea, no vomiting.  No diarrhea.  No constipation. Genitourinary: Negative for dysuria. Musculoskeletal: Negative for back pain. Skin: Negative for rash. Neurological: Negative for headaches, focal weakness or numbness.  10-point ROS otherwise negative.  ____________________________________________   PHYSICAL EXAM:  VITAL SIGNS: ED Triage Vitals  Enc Vitals Group     BP 08/28/14 0810 213/100 mmHg     Pulse Rate 08/28/14 0810 80     Resp 08/28/14 0810 18     Temp 08/28/14 0810 98.6 F (37 C)     Temp  Source 08/28/14 0810 Oral     SpO2 08/28/14 0810 99 %     Weight 08/28/14 0810 174 lb (78.926 kg)     Height 08/28/14 0810 _0  (1.702 m)     Head Cir --      Peak Flow --      Pain Score 08/28/14 0814 8     Pain Loc --      Pain Edu? --      Excl. in [place]? --     Constitutional: Alert and oriented. Well appearing and in no acute distress. Eyes: Conjunctivae are normal. PERRL. EOMI. Head: Atraumatic. Mild right anterior facial swelling adjacent to right upper lip. No erythema or induration.  Ears: no erythema, normal TMs.  Nose: No congestion/rhinnorhea. Mouth/Throat: Mucous membranes are moist.  Oropharynx  non-erythematous. Right upper mouth with multiple dental caries and fractures along #4-7 teeth, with mod TTP. Mild gum erythema and swelling. Small fluctuance above tooth #4 and 5 along upper gum line Neck: No stridor.  No cervical spine tenderness to palpation. Hematological/Lymphatic/Immunilogical: No cervical lymphadenopathy. Cardiovascular: Normal rate, regular rhythm. Grossly normal heart sounds.  Good peripheral circulation. Respiratory: Normal respiratory effort.  No retractions. Lungs CTAB. Gastrointestinal: Soft and nontender. No distention. No abdominal bruits. No CVA tenderness. Musculoskeletal: No lower extremity tenderness nor edema.  No joint effusions. Neurologic:  Normal speech and language. No gross focal neurologic deficits are appreciated. Speech is normal. No gait instability. Skin:  Skin is warm, dry and intact. No rash noted. Psychiatric: Mood and affect are normal. Speech and behavior are normal.                                CBC with Differential     Status: Abnormal   Collection Time: 08/28/14 12:20 PM  Result Value Ref Range   WBC 8.6 3.6 - 11.0 K/uL   RBC 4.30 3.80 - 5.20 MIL/uL   Hemoglobin 14.2 12.0 - 16.0 g/dL   HCT 43.4 35.0 - 47.0 %   MCV 100.9 (H) 80.0 - 100.0 fL   MCH 33.1 26.0 - 34.0 pg   MCHC 32.9 32.0 - 36.0 g/dL   RDW 13.8 11.5 - 14.5 %   Platelets 252 150 - 440 K/uL   Neutrophils Relative % 59 %   Neutro Abs 5.1 1.4 - 6.5 K/uL   Lymphocytes Relative 30 %   Lymphs Abs 2.6 1.0 - 3.6 K/uL   Monocytes Relative 7 %   Monocytes Absolute 0.6 0.2 - 0.9 K/uL   Eosinophils Relative 1 %   Eosinophils Absolute 0.1 0 - 0.7 K/uL   Basophils Relative 3 %   Basophils Absolute 0.3 (H) 0 - 0.1 K/uL  Troponin I     Status: None   Collection Time: 08/28/14 12:47 PM  Result Value Ref Range   Troponin I <0.03 <0.031 ng/mL    Comment:        NO INDICATION OF MYOCARDIAL INJURY.   Comprehensive metabolic panel     Status: Abnormal   Collection Time:  08/28/14 12:47 PM  Result Value Ref Range   Sodium 139 135 - 145 mmol/L   Potassium 4.0 3.5 - 5.1 mmol/L    Comment: HEMOLYSIS AT THIS LEVEL MAY AFFECT RESULT   Chloride 105 101 - 111 mmol/L   CO2 24 22 - 32 mmol/L   Glucose, Bld 85 65 - 99 mg/dL   BUN 12 6 - 20  mg/dL   Creatinine, Ser 0.81 0.44 - 1.00 mg/dL   Calcium 8.9 8.9 - 10.3 mg/dL   Total Protein 7.8 6.5 - 8.1 g/dL   Albumin 3.7 3.5 - 5.0 g/dL   AST 55 (H) 15 - 41 U/L   ALT 45 14 - 54 U/L   Alkaline Phosphatase 102 38 - 126 U/L   Total Bilirubin 0.7 0.3 - 1.2 mg/dL   GFR calc non Af Amer >60 >60 mL/min   GFR calc Af Amer >60 >60 mL/min    Comment: (NOTE) The eGFR has been calculated using the CKD EPI equation. This calculation has not been validated in all clinical situations. eGFR's persistently <60 mL/min signify possible Chronic Kidney Disease.    Anion gap 10 5 - 15    PROCEDURES  Procedure(s) performed:  Procedure explained to patient and pt verbally consent to procedure of I&D of dental abscess. Viscous lidocaine 2% used and held in place for 5 minutes. #11 blade scalpel  Used to make small incision along gum line above #4, 5 teeth with immediate purulent return. Mild to mod drainage of whitish purulent drainage. Pt tolerated well and reports  Improved pain afterwards.  ____________________________________________   INITIAL IMPRESSION / ASSESSMENT AND PLAN / ED COURSE  Pertinent labs & imaging results that were available during my care of the patient were reviewed by me and considered in my medical decision making (see chart for details).  Very well-appearing patient. Presents to the ER for the complaint of right upper dental pain. Patient states that she is awaiting a dentist appointment once the facial swelling resolves. Patient denies other complaints. Presents with right upper dental abscess adjacent to multiple fractured teeth and dental caries. Dental abscess drained in ER. Patient tolerated well and  report reports improved pain after abscess drained. Will treat with oral Penicillin VK and as needed pain medication. Discussed frequent rinsing mouth.  Patient also reports that she had not taken her blood pressure medication until arriving at ER. Discussed strict follow-up with her primary care physician regarding blood pressure management. Patient reports that she has an appointment this week. Denies chest pain, shortness of breath, headache, dizziness, weakness, vision changes or other complaints.  Discussed patient and planning care with Dr. Corky Downs who agrees to plan.  0930: Patient with continued hypertension in ER. Patient takes oral 25 mg HCTZ at home daily for blood pressure. Reports she used to be on lisinopril however stopped when she ran out years ago but has not yet been restarted. Patient states has appointment with her primary care physician this Thursday. Will evaluate labs and EKG. We'll plan to restart patient on lisinopril and have close follow-up for primary care management. Patient denies chest pain, headache, vision changes, weakness, dizziness or other complaints. Patient states that her pain has improved and feeling better.  1045: Awaiting labs. Patient resting calmly in bed in no acute distress.  1200: Patient reports much improved. Patient states pain has also improved. Still awaiting labs. Denies other complaints.  1350: Well-appearing. Reviewed labs. Blood pressure also improved. Patient asymptomatic. Patient with chronic hypertension. Patient blood pressure chronically elevated when seen in ER in past as well as today. Blood pressure improved in ER. Will discharge patient with prescription for lisinopril. Patient to take lisinopril daily as well as with HCTZ. Patient to follow up with as scheduled with her primary care physician this Thursday. Discussed very strict return and follow-up parameters with patient. States dental pain now 2/10. Denies other complaints. Patient  also  to follow up with dentist as previously discussed. Patient agreed to plan. ____________________________________________   FINAL CLINICAL IMPRESSION(S) / ED DIAGNOSES  Final diagnoses:  Pain, dental  Dental abscess  hypertension      Marylene Land, NP 08/28/14 1603  Lavonia Drafts, MD 08/29/14 1744

## 2014-08-28 NOTE — Discharge Instructions (Signed)
Take medication as prescribed. Continue to frequently rinse mouth. Monitor blood pressure at home and keep a journal. Continue taking home hydrochlorothiazide.   Follow-up with your primary care physician this Thursday as scheduled.  Return to the ER immediately for chest pain, shortness of breath, headaches, new or worsening concerns.      Dental Abscess A dental abscess is a collection of infected fluid (pus) from a bacterial infection in the inner part of the tooth (pulp). It usually occurs at the end of the tooth's root.  CAUSES   Severe tooth decay.  Trauma to the tooth that allows bacteria to enter into the pulp, such as a broken or chipped tooth. SYMPTOMS   Severe pain in and around the infected tooth.  Swelling and redness around the abscessed tooth or in the mouth or face.  Tenderness.  Pus drainage.  Bad breath.  Bitter taste in the mouth.  Difficulty swallowing.  Difficulty opening the mouth.  Nausea.  Vomiting.  Chills.  Swollen neck glands. DIAGNOSIS   A medical and dental history will be taken.  An examination will be performed by tapping on the abscessed tooth.  X-rays may be taken of the tooth to identify the abscess. TREATMENT The goal of treatment is to eliminate the infection. You may be prescribed antibiotic medicine to stop the infection from spreading. A root canal may be performed to save the tooth. If the tooth cannot be saved, it may be pulled (extracted) and the abscess may be drained.  HOME CARE INSTRUCTIONS  Only take over-the-counter or prescription medicines for pain, fever, or discomfort as directed by your caregiver.  Rinse your mouth (gargle) often with salt water ( tsp salt in 8 oz [250 ml] of warm water) to relieve pain or swelling.  Do not drive after taking pain medicine (narcotics).  Do not apply heat to the outside of your face.  Return to your dentist for further treatment as directed. SEEK MEDICAL CARE IF:  Your  pain is not helped by medicine.  Your pain is getting worse instead of better. SEEK IMMEDIATE MEDICAL CARE IF:  You have a fever or persistent symptoms for more than 2-3 days.  You have a fever and your symptoms suddenly get worse.  You have chills or a very bad headache.  You have problems breathing or swallowing.  You have trouble opening your mouth.  You have swelling in the neck or around the eye. Document Released: 03/04/2005 Document Revised: 11/27/2011 Document Reviewed: 06/12/2010 [facility] Patient Information 2015 [place], [place]. This information is not intended to replace advice given to you by your health care provider. Make sure you discuss any questions you have with your health care provider.  Dental Pain Toothache is pain in or around a tooth. It may get worse with chewing or with cold or heat.  HOME CARE  Your dentist may use a numbing medicine during treatment. If so, you may need to avoid eating until the medicine wears off. Ask your dentist about this.  Only take medicine as told by your dentist or doctor.  Avoid chewing food near the painful tooth until after all treatment is done. Ask your dentist about this. GET HELP RIGHT AWAY IF:   The problem gets worse or new problems appear.  You have a fever.  There is redness and puffiness (swelling) of the face, jaw, or neck.  You cannot open your mouth.  There is pain in the jaw.  There is very bad pain that is  not helped by medicine. MAKE SURE YOU:   Understand these instructions.  Will watch your condition.  Will get help right away if you are not doing well or get worse. Document Released: 08/21/2007 Document Revised: 05/27/2011 Document Reviewed: 08/21/2007 [facility] Patient Information 2015 [place], [place]. This information is not intended to replace advice given to you by your health care provider. Make sure you discuss any questions you have with your health care  provider.  Hypertension Hypertension, commonly called high blood pressure, is when the force of blood pumping through your arteries is too strong. Your arteries are the blood vessels that carry blood from your heart throughout your body. A blood pressure reading consists of a higher number over a lower number, such as 110/72. The higher number (systolic) is the pressure inside your arteries when your heart pumps. The lower number (diastolic) is the pressure inside your arteries when your heart relaxes. Ideally you want your blood pressure below 120/80. Hypertension forces your heart to work harder to pump blood. Your arteries may become narrow or stiff. Having hypertension puts you at risk for heart disease, stroke, and other problems.  RISK FACTORS Some risk factors for high blood pressure are controllable. Others are not.  Risk factors you cannot control include:   Race. You may be at higher risk if you are African American.  Age. Risk increases with age.  Gender. Men are at higher risk than women before age 21 years. After age 43, women are at higher risk than men. Risk factors you can control include:  Not getting enough exercise or physical activity.  Being overweight.  Getting too much fat, sugar, calories, or salt in your diet.  Drinking too much alcohol. SIGNS AND SYMPTOMS Hypertension does not usually cause signs or symptoms. Extremely high blood pressure (hypertensive crisis) may cause headache, anxiety, shortness of breath, and nosebleed. DIAGNOSIS  To check if you have hypertension, your health care provider will measure your blood pressure while you are seated, with your arm held at the level of your heart. It should be measured at least twice using the same arm. Certain conditions can cause a difference in blood pressure between your right and left arms. A blood pressure reading that is higher than normal on one occasion does not mean that you need treatment. If one blood  pressure reading is high, ask your health care provider about having it checked again. TREATMENT  Treating high blood pressure includes making lifestyle changes and possibly taking medicine. Living a healthy lifestyle can help lower high blood pressure. You may need to change some of your habits. Lifestyle changes may include:  Following the DASH diet. This diet is high in fruits, vegetables, and whole grains. It is low in salt, red meat, and added sugars.  Getting at least 2 hours of brisk physical activity every week.  Losing weight if necessary.  Not smoking.  Limiting alcoholic beverages.  Learning ways to reduce stress. If lifestyle changes are not enough to get your blood pressure under control, your health care provider may prescribe medicine. You may need to take more than one. Work closely with your health care provider to understand the risks and benefits. HOME CARE INSTRUCTIONS  Have your blood pressure rechecked as directed by your health care provider.   Take medicines only as directed by your health care provider. Follow the directions carefully. Blood pressure medicines must be taken as prescribed. The medicine does not work as well when you skip doses. Skipping doses  also puts you at risk for problems.   Do not smoke.   Monitor your blood pressure at home as directed by your health care provider. SEEK MEDICAL CARE IF:   You think you are having a reaction to medicines taken.  You have recurrent headaches or feel dizzy.  You have swelling in your ankles.  You have trouble with your vision. SEEK IMMEDIATE MEDICAL CARE IF:  You develop a severe headache or confusion.  You have unusual weakness, numbness, or feel faint.  You have severe chest or abdominal pain.  You vomit repeatedly.  You have trouble breathing. MAKE SURE YOU:   Understand these instructions.  Will watch your condition.  Will get help right away if you are not doing well or get  worse. Document Released: 03/04/2005 Document Revised: 07/19/2013 Document Reviewed: 12/25/2012 [facility] Patient Information 2015 [place], [place]. This information is not intended to replace advice given to you by your health care provider. Make sure you discuss any questions you have with your health care provider.

## 2014-08-28 NOTE — ED Notes (Signed)
Pt comes into the ED via EMS from home with tooth pain of the right upper tooth for the past 2 weeks with facial swelling.[name]

## 2014-09-01 ENCOUNTER — Ambulatory Visit: Payer: Self-pay

## 2015-03-25 ENCOUNTER — Encounter: Payer: Self-pay | Admitting: Emergency Medicine

## 2015-03-25 ENCOUNTER — Emergency Department
Admission: EM | Admit: 2015-03-25 | Discharge: 2015-03-25 | Disposition: A | Discharge status: Home / Self Care | Payer: Self-pay | Attending: Student | Admitting: Student

## 2015-03-25 DIAGNOSIS — K029 Dental caries, unspecified: Secondary | ICD-10-CM | POA: Insufficient documentation

## 2015-03-25 DIAGNOSIS — I10 Essential (primary) hypertension: Secondary | ICD-10-CM | POA: Insufficient documentation

## 2015-03-25 DIAGNOSIS — K0381 Cracked tooth: Secondary | ICD-10-CM | POA: Insufficient documentation

## 2015-03-25 DIAGNOSIS — S025XXA Fracture of tooth (traumatic), initial encounter for closed fracture: Secondary | ICD-10-CM

## 2015-03-25 DIAGNOSIS — Z792 Long term (current) use of antibiotics: Secondary | ICD-10-CM | POA: Insufficient documentation

## 2015-03-25 DIAGNOSIS — Z79899 Other long term (current) drug therapy: Secondary | ICD-10-CM | POA: Insufficient documentation

## 2015-03-25 DIAGNOSIS — K047 Periapical abscess without sinus: Secondary | ICD-10-CM | POA: Insufficient documentation

## 2015-03-25 DIAGNOSIS — F17210 Nicotine dependence, cigarettes, uncomplicated: Secondary | ICD-10-CM | POA: Insufficient documentation

## 2015-03-25 MED ORDER — LIDOCAINE-EPINEPHRINE 2 %-1:100000 IJ SOLN
INTRAMUSCULAR | Status: AC
  Filled 2015-03-25: qty 1.7

## 2015-03-25 MED ORDER — AMOXICILLIN 875 MG PO TABS: 875.0000 mg | ORAL_TABLET | Freq: Two times a day (BID) | ORAL | Status: DC

## 2015-03-25 MED ORDER — MAGIC MOUTHWASH W/LIDOCAINE: 5.0000 mL | Freq: Four times a day (QID) | ORAL | Status: DC

## 2015-03-25 MED ORDER — LIDOCAINE-EPINEPHRINE 2 %-1:100000 IJ SOLN: 1.7000 mL | Freq: Once | INTRAMUSCULAR | Status: DC

## 2015-03-25 NOTE — ED Notes (Signed)
E sig not working, RX 2 and int given to patient.

## 2015-03-25 NOTE — Discharge Instructions (Signed)
Dental Abscess °A dental abscess is a collection of pus in or around a tooth. °CAUSES °This condition is caused by a bacterial infection around the root of the tooth that involves the inner part of the tooth (pulp). It may result from: °· Severe tooth decay. °· Trauma to the tooth that allows bacteria to enter into the pulp, such as a broken or chipped tooth. °· Severe gum disease around a tooth. °SYMPTOMS °Symptoms of this condition include: °· Severe pain in and around the infected tooth. °· Swelling and redness around the infected tooth, in the mouth, or in the face. °· Tenderness. °· Pus drainage. °· Bad breath. °· Bitter taste in the mouth. °· Difficulty swallowing. °· Difficulty opening the mouth. °· Nausea. °· Vomiting. °· Chills. °· Swollen neck glands. °· Fever. °DIAGNOSIS °This condition is diagnosed with examination of the infected tooth. During the exam, your dentist may tap on the infected tooth. Your dentist will also ask about your medical and dental history and may order X-rays. °TREATMENT °This condition is treated by eliminating the infection. This may be done with: °· Antibiotic medicine. °· A root canal. This may be performed to save the tooth. °· Pulling (extracting) the tooth. This may also involve draining the abscess. This is done if the tooth cannot be saved. °HOME CARE INSTRUCTIONS °· Take medicines only as directed by your dentist. °· If you were prescribed antibiotic medicine, finish all of it even if you start to feel better. °· Rinse your mouth (gargle) often with salt water to relieve pain or swelling. °· Do not drive or operate heavy machinery while taking pain medicine. °· Do not apply heat to the outside of your mouth. °· Keep all follow-up visits as directed by your dentist. This is important. °SEEK MEDICAL CARE IF: °· Your pain is worse and is not helped by medicine. °SEEK IMMEDIATE MEDICAL CARE IF: °· You have a fever or chills. °· Your symptoms suddenly get worse. °· You have a  very bad headache. °· You have problems breathing or swallowing. °· You have trouble opening your mouth. °· You have swelling in your neck or around your eye. °  °This information is not intended to replace advice given to you by your health care provider. Make sure you discuss any questions you have with your health care provider. °  °Document Released: 03/04/2005 Document Revised: 07/19/2014 Document Reviewed: 03/01/2014 °Elsevier Interactive Patient Education ©2016 Elsevier Inc. ° °Dental Caries °Dental caries (also called tooth decay) is the most common oral disease. It can occur at any age but is more common in children and young adults.  °HOW DENTAL CARIES DEVELOPS  °The process of decay begins when bacteria and foods (particularly sugars and starches) combine in your mouth to produce plaque. Plaque is a substance that sticks to the hard, outer surface of a tooth (enamel). The bacteria in plaque produce acids that attack enamel. These acids may also attack the root surface of a tooth (cementum) if it is exposed. Repeated attacks dissolve these surfaces and create holes in the tooth (cavities). If left untreated, the acids destroy the other layers of the tooth.  °RISK FACTORS °· Frequent sipping of sugary beverages.   °· Frequent snacking on sugary and starchy foods, especially those that easily get stuck in the teeth.   °· Poor oral hygiene.   °· Dry mouth.   °· Substance abuse such as methamphetamine abuse.   °· Broken or poor-fitting dental restorations.   °· Eating disorders.   °· Gastroesophageal reflux disease (  GERD).   Certain radiation treatments to the head and neck. SYMPTOMS In the early stages of dental caries, symptoms are seldom present. Sometimes white, chalky areas may be seen on the enamel or other tooth layers. In later stages, symptoms may include:  Pits and holes on the enamel.  Toothache after sweet, hot, or cold foods or drinks are consumed.  Pain around the tooth.  Swelling  around the tooth. DIAGNOSIS  Most of the time, dental caries is detected during a regular dental checkup. A diagnosis is made after a thorough medical and dental history is taken and the surfaces of your teeth are checked for signs of dental caries. Sometimes special instruments, such as lasers, are used to check for dental caries. Dental X-ray exams may be taken so that areas not visible to the eye (such as between the contact areas of the teeth) can be checked for cavities.  TREATMENT  If dental caries is in its early stages, it may be reversed with a fluoride treatment or an application of a remineralizing agent at the dental office. Thorough brushing and flossing at home is needed to aid these treatments. If it is in its later stages, treatment depends on the location and extent of tooth destruction:   If a small area of the tooth has been destroyed, the destroyed area will be removed and cavities will be filled with a material such as gold, silver amalgam, or composite resin.   If a large area of the tooth has been destroyed, the destroyed area will be removed and a cap (crown) will be fitted over the remaining tooth structure.   If the center part of the tooth (pulp) is affected, a procedure called a root canal will be needed before a filling or crown can be placed.   If most of the tooth has been destroyed, the tooth may need to be pulled (extracted). HOME CARE INSTRUCTIONS You can prevent, stop, or reverse dental caries at home by practicing good oral hygiene. Good oral hygiene includes:  Thoroughly cleaning your teeth at least twice a day with a toothbrush and dental floss.   Using a fluoride toothpaste. A fluoride mouth rinse may also be used if recommended by your dentist or health care provider.   Restricting the amount of sugary and starchy foods and sugary liquids you consume.   Avoiding frequent snacking on these foods and sipping of these liquids.   Keeping regular  visits with a dentist for checkups and cleanings. PREVENTION   Practice good oral hygiene.  Consider a dental sealant. A dental sealant is a coating material that is applied by your dentist to the pits and grooves of teeth. The sealant prevents food from being trapped in them. It may protect the teeth for several years.  Ask about fluoride supplements if you live in a community without fluorinated water or with water that has a low fluoride content. Use fluoride supplements as directed by your dentist or health care provider.  Allow fluoride varnish applications to teeth if directed by your dentist or health care provider.   This information is not intended to replace advice given to you by your health care provider. Make sure you discuss any questions you have with your health care provider.   Document Released: 11/24/2001 Document Revised: 03/25/2014 Document Reviewed: 03/06/2012 Elsevier Interactive Patient Education 2016 Elsevier Inc.  Dental Pain Dental pain may be caused by many things, including:  Tooth decay (cavities or caries). Cavities expose the nerve of your  tooth to air and hot or cold temperatures. This can cause pain or discomfort.  Abscess or infection. A dental abscess is a collection of infected pus from a bacterial infection in the inner part of the tooth (pulp). It usually occurs at the end of the tooth's root.  Injury.  An unknown reason (idiopathic). Your pain may be mild or severe. It may only occur when:  You are chewing.  You are exposed to hot or cold temperature.  You are eating or drinking sugary foods or beverages, such as soda or candy. Your pain may also be constant. HOME CARE INSTRUCTIONS Watch your dental pain for any changes. The following actions may help to lessen any discomfort that you are feeling:  Take medicines only as directed by your dentist.  If you were prescribed an antibiotic medicine, finish all of it even if you start to feel  better.  Keep all follow-up visits as directed by your dentist. This is important.  Do not apply heat to the outside of your face.  Rinse your mouth or gargle with salt water if directed by your dentist. This helps with pain and swelling.  You can make salt water by adding  tsp of salt to 1 cup of warm water.  Apply ice to the painful area of your face:  Put ice in a plastic bag.  Place a towel between your skin and the bag.  Leave the ice on for 20 minutes, 2-3 times per day.  Avoid foods or drinks that cause you pain, such as:  Very hot or very cold foods or drinks.  Sweet or sugary foods or drinks. SEEK MEDICAL CARE IF:  Your pain is not controlled with medicines.  Your symptoms are worse.  You have new symptoms. SEEK IMMEDIATE MEDICAL CARE IF:  You are unable to open your mouth.  You are having trouble breathing or swallowing.  You have a fever.  Your face, neck, or jaw is swollen.   This information is not intended to replace advice given to you by your health care provider. Make sure you discuss any questions you have with your health care provider.   Document Released: 03/04/2005 Document Revised: 07/19/2014 Document Reviewed: 02/28/2014 Elsevier Interactive Patient Education [employer] Elsevier Inc.

## 2015-03-25 NOTE — ED Provider Notes (Signed)
[facility] Emergency Department Provider Note ?  ? ____________________________________________ ? Time seen: 10:48 AM ? I have reviewed the triage vital signs and the nursing notes.  ________ HISTORY ? Chief Complaint Dental Pain     HPI  [name] is a 55 y.o. female patient has not take any medications prior to arrival. She states that the pain is sharp, constant, worse with chewing.   who presents to emergency department complaining of right upper dentition pain. The patient states that the symptoms have been going on for the last 3-4 days. She has known dental caries as well as dental fractures and bilateral upper dentition. Patient states that she goes to church with a dentist who has agreed to oversee her care however he recommended that she will be placed on antibiotics prior to dental work. Patient denies any fevers or chills, difficulty swallowing or breathing, chest pain, shortness of breath, nausea, vomiting, diarrhea. ? ? ? Past Medical History  Diagnosis Date  . Hypertension     There are no active problems to display for this patient.  ? Past Surgical History  Procedure Laterality Date  . Breast biopsy Left 2011    CORE W/CLIP  . Tubal ligation     ? Current Outpatient Rx  Name  Route  Sig  Dispense  Refill  . amoxicillin (AMOXIL) 875 MG tablet   Oral   Take 1 tablet (875 mg total) by mouth 2 (two) times daily.   14 tablet   0   . hydrochlorothiazide (HYDRODIURIL) 25 MG tablet   Oral   Take 1 tablet (25 mg total) by mouth daily.   30 tablet   0   . lisinopril (PRINIVIL,ZESTRIL) 10 MG tablet   Oral   Take 1 tablet (10 mg total) by mouth daily.   30 tablet   0   . magic mouthwash w/lidocaine SOLN   Oral   Take 5 mLs by mouth 4 (four) times daily.   240 mL   0     Dispense in a 1/1/1/1 ratio. Use lidocaine, diphen ...   . oxyCODONE-acetaminophen (ROXICET) 5-325 MG per tablet   Oral   Take 1 tablet by  mouth every 8 (eight) hours as needed for moderate pain or severe pain (Do not drive or operate heavy machinery while taking as can cause drowsiness.).   9 tablet   0   . penicillin v potassium (VEETID) 500 MG tablet   Oral   Take 1 tablet (500 mg total) by mouth 4 (four) times daily.   40 tablet   0   . triamcinolone ointment (KENALOG) 0.5 %   Topical   Apply 1 application topically 2 (two) times daily.   30 g   0    ? Allergies Review of patient's allergies indicates no known allergies. ? No family history on file. ? Social History Social History  Substance Use Topics  . Smoking status: Current Every Day Smoker -- 0.33 packs/day    Types: Cigarettes  . Smokeless tobacco: None  . Alcohol Use: No   ? Review of Systems Constitutional: no fever. Eyes: no discharge ENT: no sore throat. Endorses right upper dental pain. Cardiovascular: no chest pain. Respiratory: no cough. No sob Gastrointestinal: denies abdominal pain, vomiting, diarrhea, and constipation Genitourinary: no dysuria. Negative for hematuria Musculoskeletal: Negative for back pain. Skin: Negative for rash. Neurological: Negative for headaches  10-point ROS otherwise negative.  _______________ PHYSICAL EXAM: ? VITAL SIGNS:   ED  Triage Vitals  Enc Vitals Group     BP 03/25/15 1019 156/112 mmHg     Pulse Rate 03/25/15 1019 84     Resp 03/25/15 1019 18     Temp 03/25/15 1019 98.5 F (36.9 C)     Temp Source 03/25/15 1019 Oral     SpO2 03/25/15 1019 98 %     Weight 03/25/15 1019 183 lb (83.008 kg)     Height 03/25/15 1019 5\' 7"  (1.702 m)     Head Cir --      Peak Flow --      Pain Score 03/25/15 1020 9     Pain Loc --      Pain Edu? --      Excl. in GC? --    ?  Constitutional: Alert and oriented. Well appearing and in no distress. Eyes: Conjunctivae are normal.  ENT      Head: Normocephalic and atraumatic.      Ears:       Nose: No congestion/rhinnorhea.      Mouth/Throat: Mucous  membranes are moist.  poor dentition throughout mouth. Multiple dental caries and fractures visualized. Second molar right side upper dentition is fractured with surrounding erythema and edema. No fluctuance noted with palpation with tongue depressor. No drainage noted. Hematological/Lymphatic/Immunilogical: No cervical lymphadenopathy. Cardiovascular: Normal rate, regular rhythm. Normal S1 and S2. Respiratory: Normal respiratory effort without tachypnea nor retractions. Lungs CTAB. Gastrointestinal: Soft and nontender. No distention. There is no CVA tenderness. Genitourinary:  Musculoskeletal: Nontender with normal range of motion in all extremities.  Neurologic:  Normal speech and language. No gross focal neurologic deficits are appreciated. Skin:  Skin is warm, dry and intact. No rash noted. Psychiatric: Mood and affect are normal. Speech and behavior are normal. Patient exhibits appropriate insight and judgment.    LABS (all labs ordered are listed, but only abnormal results are displayed)  Labs Reviewed - No data to display  ___________ RADIOLOGY    _____________ PROCEDURES ? Procedure(s) performed:    Medications  lidocaine-EPINEPHrine (XYLOCAINE W/EPI) 2 %-1:100000 (with pres) injection 1.7 mL (not administered)  lidocaine-EPINEPHrine (XYLOCAINE W/EPI) 2 %-1:100000 (with pres) injection (not administered)    ______________________________________________________ INITIAL IMPRESSION / ASSESSMENT AND PLAN / ED COURSE ? Pertinent labs & imaging results that were available during my care of the patient were reviewed by me and considered in my medical decision making (see chart for details).   Patient's diagnosis is consistent with dental caries, dental fractures, and dental abscess. Patient will be placed on antibiotics and symptom control medication. She is to follow-up with her dentist after completing antibiotics for definitive treatment of fractures and caries. Patient  verbalizes understanding of her diagnosis and treatment plan and verbalizes compliance with same.     New Prescriptions   AMOXICILLIN (AMOXIL) 875 MG TABLET    Take 1 tablet (875 mg total) by mouth 2 (two) times daily.   MAGIC MOUTHWASH W/LIDOCAINE SOLN    Take 5 mLs by mouth 4 (four) times daily.   ____________________________________________ FINAL CLINICAL IMPRESSION(S) / ED DIAGNOSES?  Final diagnoses:  Dental caries  Dental abscess  Broken tooth, closed, initial encounter            Racheal Patches, PA-C 03/25/15 1052  Gayla Doss, MD 03/25/15 1440

## 2015-03-25 NOTE — ED Notes (Signed)
Abscessed tooth, pain began 3 days ago.

## 2015-08-23 ENCOUNTER — Other Ambulatory Visit: Payer: Self-pay | Admitting: Oncology

## 2015-08-23 ENCOUNTER — Ambulatory Visit: Payer: Self-pay | Attending: Oncology

## 2015-08-23 ENCOUNTER — Ambulatory Visit
Admission: RE | Admit: 2015-08-23 | Discharge: 2015-08-23 | Disposition: A | Discharge status: Home / Self Care | Payer: Self-pay | Source: Ambulatory Visit | Attending: Oncology | Admitting: Oncology

## 2015-08-23 VITALS — BP 164/128 | HR 71 | Temp 98.1°F | Resp 20 | Ht 68.5 in | Wt 176.4 lb

## 2015-08-23 DIAGNOSIS — Z0000 Encounter for general adult medical examination without abnormal findings: Secondary | ICD-10-CM

## 2015-08-23 NOTE — Progress Notes (Signed)
Subjective:     Patient ID: [name], female   DOB: January 30, 1961, 55 y.o.   MRN: [id number]  HPI   Review of Systems     Objective:   Physical Exam  Pulmonary/Chest: Right breast exhibits no inverted nipple, no mass, no nipple discharge, no skin change and no tenderness. Left breast exhibits no inverted nipple, no mass, no nipple discharge, no skin change and no tenderness. Breasts are symmetrical.       Assessment:     55 [facility].  Patient screened, and meets [facility] eligibility.  Patient does not have insurance, Medicare or Medicaid.  Handout given on Affordable Care Act. Instructed patient on breast self-exam using teach back method.  CBE unremarkable.  No mass or lump palpated.   Patient hypertensive.  States she cannot afford medication, and does not have a primary physician.  States she monitors at home, and it is within normal limits.  Given sheet to record blood pressures daily to carry to upcomig appointment with new primary physician.    Plan:     Sent for bilateral screening mammogram.  [name].

## 2015-08-30 NOTE — Progress Notes (Signed)
Letter mailed from [facility] to notify of normal mammogram results.  Patient to return in one year for annual screening.  Copy to [facility]. 

## 2018-06-15 ENCOUNTER — Emergency Department
Admission: EM | Admit: 2018-06-15 | Discharge: 2018-06-15 | Disposition: A | Discharge status: Home / Self Care | Payer: Self-pay | Attending: Emergency Medicine | Admitting: Emergency Medicine

## 2018-06-15 ENCOUNTER — Encounter: Payer: Self-pay | Admitting: Emergency Medicine

## 2018-06-15 ENCOUNTER — Other Ambulatory Visit: Payer: Self-pay

## 2018-06-15 DIAGNOSIS — I10 Essential (primary) hypertension: Secondary | ICD-10-CM | POA: Insufficient documentation

## 2018-06-15 DIAGNOSIS — N390 Urinary tract infection, site not specified: Secondary | ICD-10-CM | POA: Insufficient documentation

## 2018-06-15 DIAGNOSIS — F17210 Nicotine dependence, cigarettes, uncomplicated: Secondary | ICD-10-CM | POA: Insufficient documentation

## 2018-06-15 DIAGNOSIS — K625 Hemorrhage of anus and rectum: Secondary | ICD-10-CM | POA: Insufficient documentation

## 2018-06-15 LAB — URINALYSIS, COMPLETE (UACMP) WITH MICROSCOPIC
BILIRUBIN URINE: NEGATIVE
GLUCOSE, UA: NEGATIVE mg/dL
Hgb urine dipstick: NEGATIVE
KETONES UR: NEGATIVE mg/dL
Nitrite: POSITIVE — AB
PH: 5 (ref 5.0–8.0)
PROTEIN: NEGATIVE mg/dL
Specific Gravity, Urine: 1.008 (ref 1.005–1.030)

## 2018-06-15 LAB — CBC WITH DIFFERENTIAL/PLATELET
Abs Immature Granulocytes: 0.06 10*3/uL (ref 0.00–0.07)
BASOS PCT: 1 %
Basophils Absolute: 0.1 10*3/uL (ref 0.0–0.1)
EOS ABS: 0.1 10*3/uL (ref 0.0–0.5)
Eosinophils Relative: 1 %
HCT: 38.4 % (ref 36.0–46.0)
Hemoglobin: 12.8 g/dL (ref 12.0–15.0)
IMMATURE GRANULOCYTES: 1 %
Lymphocytes Relative: 29 %
Lymphs Abs: 2.8 10*3/uL (ref 0.7–4.0)
MCH: 33.6 pg (ref 26.0–34.0)
MCHC: 33.3 g/dL (ref 30.0–36.0)
MCV: 100.8 fL — ABNORMAL HIGH (ref 80.0–100.0)
Monocytes Absolute: 0.5 10*3/uL (ref 0.1–1.0)
Monocytes Relative: 6 %
NEUTROS ABS: 6 10*3/uL (ref 1.7–7.7)
NEUTROS PCT: 62 %
NRBC: 0 % (ref 0.0–0.2)
Platelets: 388 10*3/uL (ref 150–400)
RBC: 3.81 MIL/uL — AB (ref 3.87–5.11)
RDW: 11.8 % (ref 11.5–15.5)
WBC: 9.5 10*3/uL (ref 4.0–10.5)

## 2018-06-15 LAB — GLUCOSE, CAPILLARY: Glucose-Capillary: 89 mg/dL (ref 70–99)

## 2018-06-15 LAB — COMPREHENSIVE METABOLIC PANEL
ALT: 36 U/L (ref 0–44)
AST: 54 U/L — ABNORMAL HIGH (ref 15–41)
Albumin: 2.8 g/dL — ABNORMAL LOW (ref 3.5–5.0)
Alkaline Phosphatase: 73 U/L (ref 38–126)
Anion gap: 8 (ref 5–15)
BUN: 7 mg/dL (ref 6–20)
CO2: 23 mmol/L (ref 22–32)
CREATININE: 0.57 mg/dL (ref 0.44–1.00)
Calcium: 8.8 mg/dL — ABNORMAL LOW (ref 8.9–10.3)
Chloride: 104 mmol/L (ref 98–111)
GFR calc non Af Amer: >60 mL/min (ref >60)
Glucose, Bld: 98 mg/dL (ref 70–99)
POTASSIUM: 4.1 mmol/L (ref 3.5–5.1)
SODIUM: 135 mmol/L (ref 135–145)
Total Bilirubin: 0.3 mg/dL (ref 0.3–1.2)
Total Protein: 7.8 g/dL (ref 6.5–8.1)

## 2018-06-15 LAB — TROPONIN I: Troponin I: <0.03 ng/mL (ref <0.03)

## 2018-06-15 MED ORDER — CIPROFLOXACIN HCL 500 MG PO TABS: 500.0000 mg | ORAL_TABLET | Freq: Two times a day (BID) | ORAL | 0 refills | Status: AC

## 2018-06-15 MED ORDER — CIPROFLOXACIN HCL 500 MG PO TABS
500.0000 mg | ORAL_TABLET | Freq: Once | ORAL | Status: AC
  Administered 2018-06-15: 500 mg via ORAL
  Filled 2018-06-15: qty 1

## 2018-06-15 MED ORDER — HYDROCHLOROTHIAZIDE 25 MG PO TABS
25.0000 mg | ORAL_TABLET | Freq: Every day | ORAL | Status: DC
  Administered 2018-06-15: 25 mg via ORAL
  Filled 2018-06-15: qty 1

## 2018-06-15 MED ORDER — ACETAMINOPHEN 500 MG PO TABS
1000.0000 mg | ORAL_TABLET | Freq: Once | ORAL | Status: AC
Start: 2018-06-15 — End: 2018-06-15
  Administered 2018-06-15: 1000 mg via ORAL
  Filled 2018-06-15: qty 2

## 2018-06-15 MED ORDER — HYDROCHLOROTHIAZIDE 25 MG PO TABS: 25.0000 mg | ORAL_TABLET | Freq: Every day | ORAL | 1 refills | Status: DC

## 2018-06-15 MED ORDER — POLYETHYLENE GLYCOL 3350 17 G PO PACK: 17.0000 g | PACK | Freq: Every day | ORAL | 0 refills | Status: DC

## 2018-06-15 NOTE — ED Triage Notes (Signed)
Pt reports has had some rectal bleeding for the past 5 days. Pt states that the blood is bright red and and is a moderate amount. Pt denies hx of hemorrhoids. Pt also reports a HA.

## 2018-06-15 NOTE — ED Triage Notes (Signed)
Pt hypertensive in triage, reports her MD took her off BP meds 2 years ago.

## 2018-06-15 NOTE — ED Notes (Signed)
Pt alert and oriented X4, active, cooperative, pt in NAD. RR even and unlabored, color WNL.  Pt informed to return if any life threatening symptoms occur.  Discharge and followup instructions reviewed. Ambulates safely. Left with all of belongings. 

## 2018-06-15 NOTE — ED Provider Notes (Signed)
[facility] Emergency Department Provider Note       Time seen: ----------------------------------------- 10:29 AM on 06/15/2018 -----------------------------------------   I have reviewed the triage vital signs and the nursing notes.  HISTORY   Chief Complaint Rectal Bleeding and Headache    HPI [name] is a 58 y.o. female with a history of hypertension who presents to the ED for rectal bleeding for the past 5 days.  Patient states the bleeding is bright red, moderate and painful.  She denies any history of hemorrhoids, also reports a headache.  She was noted to be hypertensive, reports her doctor took her off her blood pressure medications 2 years ago.  Past Medical History:  Diagnosis Date  . Hypertension     There are no active problems to display for this patient.   Past Surgical History:  Procedure Laterality Date  . BREAST BIOPSY Left 2011   CORE W/CLIP  . TUBAL LIGATION      Allergies Patient has no known allergies.  Social History Social History   Tobacco Use  . Smoking status: Current Every Day Smoker    Packs/day: 0.33    Types: Cigarettes  Substance Use Topics  . Alcohol use: No  . Drug use: No   Review of Systems Constitutional: Negative for fever. Cardiovascular: Negative for chest pain. Respiratory: Negative for shortness of breath. Gastrointestinal: Negative for abdominal pain, vomiting and diarrhea.  Positive for rectal bleeding Musculoskeletal: Negative for back pain. Skin: Negative for rash. Neurological: Positive for headache  All systems negative/normal/unremarkable except as stated in the HPI  ____________________________________________   PHYSICAL EXAM:  VITAL SIGNS: ED Triage Vitals [06/15/18 1021]  Enc Vitals Group     BP (!) 204/115     Pulse Rate 85     Resp 20     Temp 98.5 F (36.9 C)     Temp Source Oral     SpO2 99 %     Weight 170 lb (77.1 kg)     Height 5\' 7"  (1.702 m)      Head Circumference      Peak Flow      Pain Score 7     Pain Loc      Pain Edu?      Excl. in GC?    Constitutional: Alert and oriented. Well appearing and in no distress. Eyes: Conjunctivae are normal. Normal extraocular movements. ENT      Head: Normocephalic and atraumatic.      Nose: No congestion/rhinnorhea.      Mouth/Throat: Mucous membranes are moist.      Neck: No stridor. Cardiovascular: Normal rate, regular rhythm. No murmurs, rubs, or gallops. Respiratory: Normal respiratory effort without tachypnea nor retractions. Breath sounds are clear and equal bilaterally. No wheezes/rales/rhonchi. Gastrointestinal: Soft and nontender. Normal bowel sounds Musculoskeletal: Nontender with normal range of motion in extremities. No lower extremity tenderness nor edema. Neurologic:  Normal speech and language. No gross focal neurologic deficits are appreciated.  Skin:  Skin is warm, dry and intact. No rash noted. Psychiatric: Mood and affect are normal. Speech and behavior are normal.  ____________________________________________  ED COURSE:  As part of my medical decision making, I reviewed the following data within the electronic MEDICAL RECORD NUMBER History obtained from family if available, nursing notes, old chart and ekg, as well as notes from prior ED visits. Patient presented for rectal bleeding, we will assess with labs and imaging as indicated at this time.   Procedures ____________________________________________  LABS (pertinent positives/negatives)  Labs Reviewed  CBC WITH DIFFERENTIAL/PLATELET - Abnormal; Notable for the following components:      Result Value   RBC 3.81 (*)    MCV 100.8 (*)    All other components within normal limits  COMPREHENSIVE METABOLIC PANEL - Abnormal; Notable for the following components:   Calcium 8.8 (*)    Albumin 2.8 (*)    AST 54 (*)    All other components within normal limits  URINALYSIS, COMPLETE (UACMP) WITH MICROSCOPIC -  Abnormal; Notable for the following components:   Color, Urine YELLOW (*)    APPearance HAZY (*)    Nitrite POSITIVE (*)    Leukocytes,Ua MODERATE (*)    WBC, UA >50 (*)    Bacteria, UA RARE (*)    All other components within normal limits  TROPONIN I  GLUCOSE, CAPILLARY  CBG MONITORING, ED  ___________________________________________   DIFFERENTIAL DIAGNOSIS   Hemorrhoidal bleeding, anal fissure, diverticulosis, internal hemorrhoid  FINAL ASSESSMENT AND PLAN  Rectal bleeding, UTI   Plan: The patient had presented for passing bright red blood per rectum. Patient's labs did not indicate any significant anemia, they did however reveal a urinary tract infection.  She was started on antibiotics and I have restarted some HCTZ for blood pressure control.  She will be referred to GI for outpatient follow-up with stool softeners.   Ulice Dash, MD    Note: This note was generated in part or whole with voice recognition software. Voice recognition is usually quite accurate but there are transcription errors that can and very often do occur. I apologize for any typographical errors that were not detected and corrected.     Emily Filbert, MD 06/15/18 1249

## 2018-06-15 NOTE — ED Notes (Signed)
Requesting headache medication

## 2018-09-08 ENCOUNTER — Emergency Department
Admission: EM | Admit: 2018-09-08 | Discharge: 2018-09-09 | Disposition: A | Discharge status: Home / Self Care | Payer: Self-pay | Attending: Emergency Medicine | Admitting: Emergency Medicine

## 2018-09-08 ENCOUNTER — Other Ambulatory Visit: Payer: Self-pay

## 2018-09-08 ENCOUNTER — Encounter: Payer: Self-pay | Admitting: Emergency Medicine

## 2018-09-08 DIAGNOSIS — Z5321 Procedure and treatment not carried out due to patient leaving prior to being seen by health care provider: Secondary | ICD-10-CM | POA: Insufficient documentation

## 2018-09-08 DIAGNOSIS — R51 Headache: Secondary | ICD-10-CM | POA: Insufficient documentation

## 2018-09-08 DIAGNOSIS — R42 Dizziness and giddiness: Secondary | ICD-10-CM | POA: Insufficient documentation

## 2018-09-08 LAB — URINALYSIS, COMPLETE (UACMP) WITH MICROSCOPIC
Bacteria, UA: NONE SEEN
Bilirubin Urine: NEGATIVE
Glucose, UA: NEGATIVE mg/dL
Hgb urine dipstick: NEGATIVE
Ketones, ur: NEGATIVE mg/dL
Nitrite: NEGATIVE
Protein, ur: NEGATIVE mg/dL
Specific Gravity, Urine: 1.005 (ref 1.005–1.030)
Squamous Epithelial / LPF: NONE SEEN (ref 0–5)
pH: 6 (ref 5.0–8.0)

## 2018-09-08 LAB — CBC
HCT: 38.9 % (ref 36.0–46.0)
Hemoglobin: 13 g/dL (ref 12.0–15.0)
MCH: 34.3 pg — ABNORMAL HIGH (ref 26.0–34.0)
MCHC: 33.4 g/dL (ref 30.0–36.0)
MCV: 102.6 fL — ABNORMAL HIGH (ref 80.0–100.0)
Platelets: 349 10*3/uL (ref 150–400)
RBC: 3.79 MIL/uL — ABNORMAL LOW (ref 3.87–5.11)
RDW: 12.5 % (ref 11.5–15.5)
WBC: 8.8 10*3/uL (ref 4.0–10.5)
nRBC: 0 % (ref 0.0–0.2)

## 2018-09-08 LAB — BASIC METABOLIC PANEL
Anion gap: 9 (ref 5–15)
BUN: 11 mg/dL (ref 6–20)
CO2: 26 mmol/L (ref 22–32)
Calcium: 9.3 mg/dL (ref 8.9–10.3)
Chloride: 102 mmol/L (ref 98–111)
Creatinine, Ser: 0.74 mg/dL (ref 0.44–1.00)
GFR calc Af Amer: >60 mL/min (ref >60)
GFR calc non Af Amer: >60 mL/min (ref >60)
Glucose, Bld: 101 mg/dL — ABNORMAL HIGH (ref 70–99)
Potassium: 3.2 mmol/L — ABNORMAL LOW (ref 3.5–5.1)
Sodium: 137 mmol/L (ref 135–145)

## 2018-09-08 MED ORDER — SODIUM CHLORIDE 0.9% FLUSH: 3.0000 mL | Freq: Once | INTRAVENOUS | Status: DC

## 2018-09-08 NOTE — ED Triage Notes (Signed)
Pt arrived via POV with reports of feeling lightheaded and headache since yesterday, pt states she is on BP medication and takes it daily without missing any doses.  Pt does not check BP at home currently.  Pt states she feels lightheaded off and on and has had a frontal headache which she describes as a throbbing pain.

## 2018-09-09 ENCOUNTER — Emergency Department: Admission: EM | Admit: 2018-09-09 | Discharge: 2018-09-09 | Discharge status: Absent without leave | Payer: Self-pay

## 2018-09-09 NOTE — ED Notes (Signed)
No answer when called several times from lobby 

## 2018-12-28 ENCOUNTER — Encounter: Payer: Self-pay | Admitting: Emergency Medicine

## 2018-12-28 ENCOUNTER — Emergency Department
Admission: EM | Admit: 2018-12-28 | Discharge: 2018-12-29 | Disposition: A | Discharge status: Home / Self Care | Payer: Self-pay | Attending: Student | Admitting: Student

## 2018-12-28 ENCOUNTER — Other Ambulatory Visit: Payer: Self-pay

## 2018-12-28 DIAGNOSIS — Z789 Other specified health status: Secondary | ICD-10-CM

## 2018-12-28 DIAGNOSIS — I10 Essential (primary) hypertension: Secondary | ICD-10-CM | POA: Insufficient documentation

## 2018-12-28 DIAGNOSIS — Z7289 Other problems related to lifestyle: Secondary | ICD-10-CM

## 2018-12-28 DIAGNOSIS — F1010 Alcohol abuse, uncomplicated: Secondary | ICD-10-CM | POA: Insufficient documentation

## 2018-12-28 DIAGNOSIS — Z79899 Other long term (current) drug therapy: Secondary | ICD-10-CM | POA: Insufficient documentation

## 2018-12-28 DIAGNOSIS — F17210 Nicotine dependence, cigarettes, uncomplicated: Secondary | ICD-10-CM | POA: Insufficient documentation

## 2018-12-28 LAB — CBC WITH DIFFERENTIAL/PLATELET
Abs Immature Granulocytes: 0.02 10*3/uL (ref 0.00–0.07)
Basophils Absolute: 0 10*3/uL (ref 0.0–0.1)
Basophils Relative: 0 %
Eosinophils Absolute: 0 10*3/uL (ref 0.0–0.5)
Eosinophils Relative: 0 %
HCT: 37.5 % (ref 36.0–46.0)
Hemoglobin: 12.7 g/dL (ref 12.0–15.0)
Immature Granulocytes: 0 %
Lymphocytes Relative: 48 %
Lymphs Abs: 2.3 10*3/uL (ref 0.7–4.0)
MCH: 34 pg (ref 26.0–34.0)
MCHC: 33.9 g/dL (ref 30.0–36.0)
MCV: 100.5 fL — ABNORMAL HIGH (ref 80.0–100.0)
Monocytes Absolute: 0.4 10*3/uL (ref 0.1–1.0)
Monocytes Relative: 8 %
Neutro Abs: 2.2 10*3/uL (ref 1.7–7.7)
Neutrophils Relative %: 44 %
Platelets: 172 10*3/uL (ref 150–400)
RBC: 3.73 MIL/uL — ABNORMAL LOW (ref 3.87–5.11)
RDW: 14.9 % (ref 11.5–15.5)
WBC: 4.9 10*3/uL (ref 4.0–10.5)
nRBC: 0 % (ref 0.0–0.2)

## 2018-12-28 LAB — ETHANOL: Alcohol, Ethyl (B): <10 mg/dL (ref <10)

## 2018-12-28 LAB — URINE DRUG SCREEN, QUALITATIVE (ARMC ONLY)
Amphetamines, Ur Screen: NOT DETECTED
Barbiturates, Ur Screen: NOT DETECTED
Benzodiazepine, Ur Scrn: NOT DETECTED
Cannabinoid 50 Ng, Ur ~~LOC~~: NOT DETECTED
Cocaine Metabolite,Ur ~~LOC~~: NOT DETECTED
MDMA (Ecstasy)Ur Screen: NOT DETECTED
Methadone Scn, Ur: NOT DETECTED
Opiate, Ur Screen: NOT DETECTED
Phencyclidine (PCP) Ur S: NOT DETECTED
Tricyclic, Ur Screen: NOT DETECTED

## 2018-12-28 LAB — COMPREHENSIVE METABOLIC PANEL
ALT: 65 U/L — ABNORMAL HIGH (ref 0–44)
AST: 75 U/L — ABNORMAL HIGH (ref 15–41)
Albumin: 3.1 g/dL — ABNORMAL LOW (ref 3.5–5.0)
Alkaline Phosphatase: 80 U/L (ref 38–126)
Anion gap: 7 (ref 5–15)
BUN: 9 mg/dL (ref 6–20)
CO2: 24 mmol/L (ref 22–32)
Calcium: 8.4 mg/dL — ABNORMAL LOW (ref 8.9–10.3)
Chloride: 102 mmol/L (ref 98–111)
Creatinine, Ser: 0.58 mg/dL (ref 0.44–1.00)
GFR calc Af Amer: >60 mL/min (ref >60)
GFR calc non Af Amer: >60 mL/min (ref >60)
Glucose, Bld: 114 mg/dL — ABNORMAL HIGH (ref 70–99)
Potassium: 3.8 mmol/L (ref 3.5–5.1)
Sodium: 133 mmol/L — ABNORMAL LOW (ref 135–145)
Total Bilirubin: 0.8 mg/dL (ref 0.3–1.2)
Total Protein: 6.9 g/dL (ref 6.5–8.1)

## 2018-12-28 MED ORDER — CHLORDIAZEPOXIDE HCL 25 MG PO CAPS
50.0000 mg | ORAL_CAPSULE | Freq: Once | ORAL | Status: AC
  Administered 2018-12-28: 16:00:00 50 mg via ORAL
  Filled 2018-12-28: qty 2

## 2018-12-28 MED ORDER — CHLORDIAZEPOXIDE HCL 25 MG PO CAPS: ORAL_CAPSULE | ORAL | 0 refills | Status: DC

## 2018-12-28 MED ORDER — LACTATED RINGERS IV BOLUS
1000.0000 mL | Freq: Once | INTRAVENOUS | Status: AC
  Administered 2018-12-28: 1000 mL via INTRAVENOUS

## 2018-12-28 MED ORDER — VITAMIN B-1 100 MG PO TABS
100.0000 mg | ORAL_TABLET | Freq: Once | ORAL | Status: AC
  Administered 2018-12-28: 100 mg via ORAL
  Filled 2018-12-28: qty 1

## 2018-12-28 MED ORDER — FOLIC ACID 1 MG PO TABS
1.0000 mg | ORAL_TABLET | Freq: Once | ORAL | Status: AC
  Administered 2018-12-28: 1 mg via ORAL
  Filled 2018-12-28: qty 1

## 2018-12-28 NOTE — Discharge Instructions (Signed)
Please take the Librium taper as directed.   Day 1: take 50 mg (2 capsules) in AM, afternoon, and evening.  Day 2: take 50 mg (2 capsules) AM and PM.  Day 3: take 50 mg (2 capsules) in AM  Please also obtain and start taking a general multivitamin.   Please return to the ER for any new or worsening symptoms.

## 2018-12-28 NOTE — ED Triage Notes (Signed)
Patient reports she was recently discharged from [facility] in [place] after going 70+ days without drinking. Patient reports after discharge she began to drink again. Is here seeking detox. Patient reports tremors, nausea and vomiting as well as headache. Patient states her last drink was 2 days ago. Patient reports she typically drinks 3-4 40 oz beers per day.

## 2018-12-28 NOTE — ED Notes (Signed)
Unable to get a bed acceptance at this time. TTS to call back after midnight when nurse arrives.

## 2018-12-28 NOTE — ED Notes (Signed)
Pending D/C after labs and U/A, possibly admit to [facility]

## 2018-12-28 NOTE — ED Provider Notes (Addendum)
[facility] Emergency Department Provider Note  ____________________________________________   First MD Initiated Contact with Patient 12/28/18 1416     (approximate)  I have reviewed the triage vital signs and the nursing notes.  History  Chief Complaint Detox    HPI [name] is a 58 y.o. female with history of HTN, alcohol abuse, who presents to the ED desiring assistance with alcohol detox. Patient states she drinks 3-4 x 40 oz beers per day. Last drink was 36-48 hours ago. Patient states she was recently successful with alcohol cessation with the assistance of [facility] and was sober for about 70 days, recently relapsed. She reports a history of withdrawal seizure in the past. She reports feeling shaky and nauseous.    Past Medical Hx Past Medical History:  Diagnosis Date  . Hypertension     Problem List There are no active problems to display for this patient.   Past Surgical Hx Past Surgical History:  Procedure Laterality Date  . BREAST BIOPSY Left 2011   CORE W/CLIP  . TUBAL LIGATION      Medications Prior to Admission medications   Medication Sig Start Date End Date Taking? Authorizing Provider  amoxicillin (AMOXIL) 875 MG tablet Take 1 tablet (875 mg total) by mouth 2 (two) times daily. 03/25/15   Cuthriell, Delorise Royals, PA-C  hydrochlorothiazide (HYDRODIURIL) 25 MG tablet Take 1 tablet (25 mg total) by mouth daily. 08/08/14   Sharman Cheek, MD  hydrochlorothiazide (HYDRODIURIL) 25 MG tablet Take 1 tablet (25 mg total) by mouth daily. 06/15/18   Emily Filbert, MD  lisinopril (PRINIVIL,ZESTRIL) 10 MG tablet Take 1 tablet (10 mg total) by mouth daily. 08/28/14 08/28/15  Renford Dills, NP  magic mouthwash w/lidocaine SOLN Take 5 mLs by mouth 4 (four) times daily. 03/25/15   Cuthriell, Delorise Royals, PA-C  oxyCODONE-acetaminophen (ROXICET) 5-325 MG per tablet Take 1 tablet by mouth every 8 (eight) hours as needed for  moderate pain or severe pain (Do not drive or operate heavy machinery while taking as can cause drowsiness.). 08/28/14   Renford Dills, NP  penicillin v potassium (VEETID) 500 MG tablet Take 1 tablet (500 mg total) by mouth 4 (four) times daily. 08/28/14   Renford Dills, NP  polyethylene glycol [employer] [facility] / GLYCOLAX) packet Take 17 g by mouth daily. 06/15/18   Emily Filbert, MD  triamcinolone ointment (KENALOG) 0.5 % Apply 1 application topically 2 (two) times daily. 08/22/14   Chinita Pester, FNP    Allergies Patient has no known allergies.  Family Hx No family history on file.  Social Hx Social History   Tobacco Use  . Smoking status: Current Every Day Smoker    Packs/day: 0.33    Types: Cigarettes  . Smokeless tobacco: Never Used  Substance Use Topics  . Alcohol use: Yes    Comment: 3-4 40 oz beers per week  . Drug use: No     Review of Systems  Constitutional: Negative for fever, chills. Eyes: Negative for visual changes. ENT: Negative for sore throat. Cardiovascular: Negative for chest pain. Respiratory: Negative for shortness of breath. Gastrointestinal: + nausea Genitourinary: Negative for dysuria. Musculoskeletal: Negative for leg swelling. Skin: Negative for rash. Neurological: Negative for for headaches.   Physical Exam  Vital Signs: ED Triage Vitals  Enc Vitals Group     BP 12/28/18 1334 (!) 194/124     Pulse Rate 12/28/18 1334 71     Resp 12/28/18 1334 20  Temp 12/28/18 1334 98.6 F (37 C)     Temp Source 12/28/18 1334 Oral     SpO2 12/28/18 1334 98 %     Weight 12/28/18 1334 160 lb (72.6 kg)     Height 12/28/18 1334 5\' 7"  (1.702 m)     Head Circumference --      Peak Flow --      Pain Score 12/28/18 1407 8     Pain Loc --      Pain Edu? --      Excl. in [place]? --     Constitutional: Alert and oriented. Calm and cooperative.  Head: Normocephalic. Atraumatic. Eyes: Conjunctivae clear. Sclera anicteric. Nose: No congestion. No  rhinorrhea. Mouth/Throat: Mucous membranes are moist.  Neck: No stridor.   Cardiovascular: Normal rate, regular rhythm.  Extremities well perfused. Respiratory: Normal respiratory effort.  Gastrointestinal: Non-distended.  Musculoskeletal: No lower extremity edema. No deformities. Neurologic:  Normal speech and language. No gross focal neurologic deficits are appreciated. No tremors.  Skin: Skin is warm, dry and intact. No rash noted. No diaphoresis.  Psychiatric: Mood and affect are appropriate for situation.  EKG  N/A    Radiology  N/A   Procedures  Procedure(s) performed (including critical care):  Procedures   Initial Impression / Assessment and Plan / ED Course  58 y.o. female who presents to the ED for assistance with alcohol detox.   On exam she is well appearing. Hypertensive (has history of same) but no tachycardia, tremors, or diaphoresis. Does not appear that she requires hospitalization for complicated withdrawal at this time. Will initiate Librium taper, give IVF, thiamine/folate and consult [facility] to provide detox resources. Patient agreeable.    Final Clinical Impression(s) / ED Diagnosis  Final diagnoses:  Alcohol use       Note:  This document was prepared using Dragon voice recognition software and may include unintentional dictation errors.     Lilia Pro., MD 12/28/18 2032

## 2018-12-29 MED ORDER — NICOTINE 21 MG/24HR TD PT24
21.0000 mg | MEDICATED_PATCH | Freq: Once | TRANSDERMAL | Status: DC
  Filled 2018-12-29: qty 1

## 2018-12-29 MED ORDER — HYDROCHLOROTHIAZIDE 25 MG PO TABS: 25.0000 mg | ORAL_TABLET | Freq: Every day | ORAL | 0 refills | Status: DC

## 2018-12-29 MED ORDER — CHLORDIAZEPOXIDE HCL 25 MG PO CAPS: ORAL_CAPSULE | ORAL | 0 refills | Status: DC

## 2018-12-29 MED ORDER — CHLORDIAZEPOXIDE HCL 25 MG PO CAPS
25.0000 mg | ORAL_CAPSULE | Freq: Once | ORAL | Status: AC
  Administered 2018-12-29: 25 mg via ORAL
  Filled 2018-12-29: qty 1

## 2018-12-29 MED ORDER — LISINOPRIL 10 MG PO TABS: 10.0000 mg | ORAL_TABLET | Freq: Every day | ORAL | 0 refills | Status: DC

## 2018-12-29 NOTE — ED Provider Notes (Signed)
-----------------------------------------   6:12 AM on 12/29/2018 -----------------------------------------   Blood pressure (!) 168/110, pulse 78, temperature 98.6 F (37 C), temperature source Oral, resp. rate 18, height 5\' 7"  (1.702 m), weight 72.6 kg, SpO2 98 %.  The patient is sleeping at this time.  There have been no acute events since the last update.  Awaiting acceptance from [facility].   Paulette Blanch, MD 12/29/18 [phone]

## 2018-12-29 NOTE — ED Notes (Signed)
Patient continues to ask about smoking a cigarette.  She is out of bp meds and has not taken for 2 days.  She says her daughter is not able to get her meds if we send to pharmacy.  I offered to check with medication management to see if they could dispens as [facility] will not take the patient without bp meds in hand.  She says she would like to leave and go to [place] where she has stayed before.  She is trying to call friend for ride.  md and tts aware patient wants to leave.

## 2018-12-29 NOTE — ED Notes (Signed)
Patient alert and awake.  Says she has slight itching.  She is anxious to go smoke a cigarette and says the patch is not working.  I am going to give her a librium and asked her to wait as that will help.  No HI, SI or visual or auditory hallucinations.

## 2018-12-29 NOTE — ED Notes (Signed)
Per [facility], pt will not be accepted until after 8am tomorrow. Staff at [facility] stated they called and spoke to someone here and then gave away the acceptance to another pt and he is unable to accept this pt until after 8am.

## 2018-12-29 NOTE — ED Notes (Signed)
Pt sleeping soundly.

## 2018-12-29 NOTE — BH Assessment (Signed)
TTS contacted [facility] to confirm pt had a detox bed and the intake RN Herbie Baltimore) confirmed that if patient could get her bottle of B/P medications then she would be accepted for detox treatment. Patient states she is unable to obtain her bottle of medications because she needs a refill and she is unable to afford the refill. She also stated that her daughter would not be able assist her financially with obtaining her medications. Patient was informed that her bed acceptance was contingent upon obtaining her prescribed medications. Once being told this, pt asked if she could smoke a cigarette at the facility. When she was told by her nurse that she may not be able to smoke cigarettes - she asked to be discharged.  Patient is not at immediate risk for having withdrawals. She is also not on a CIWA/COWS precaution. Patient is safe for discharge.  Patient was provided with outpatient SA/MH resources.

## 2019-03-17 ENCOUNTER — Emergency Department: Payer: Self-pay

## 2019-03-17 ENCOUNTER — Encounter: Payer: Self-pay | Admitting: Emergency Medicine

## 2019-03-17 ENCOUNTER — Emergency Department
Admission: EM | Admit: 2019-03-17 | Discharge: 2019-03-17 | Disposition: A | Discharge status: Home / Self Care | Payer: Self-pay | Attending: Emergency Medicine | Admitting: Emergency Medicine

## 2019-03-17 ENCOUNTER — Other Ambulatory Visit: Payer: Self-pay

## 2019-03-17 DIAGNOSIS — I10 Essential (primary) hypertension: Secondary | ICD-10-CM | POA: Insufficient documentation

## 2019-03-17 DIAGNOSIS — R0789 Other chest pain: Secondary | ICD-10-CM | POA: Insufficient documentation

## 2019-03-17 DIAGNOSIS — R519 Headache, unspecified: Secondary | ICD-10-CM | POA: Insufficient documentation

## 2019-03-17 DIAGNOSIS — R079 Chest pain, unspecified: Secondary | ICD-10-CM

## 2019-03-17 DIAGNOSIS — K625 Hemorrhage of anus and rectum: Secondary | ICD-10-CM | POA: Insufficient documentation

## 2019-03-17 DIAGNOSIS — Z79899 Other long term (current) drug therapy: Secondary | ICD-10-CM | POA: Insufficient documentation

## 2019-03-17 DIAGNOSIS — F17210 Nicotine dependence, cigarettes, uncomplicated: Secondary | ICD-10-CM | POA: Insufficient documentation

## 2019-03-17 LAB — CBC
HCT: 37.9 % (ref 36.0–46.0)
Hemoglobin: 13.4 g/dL (ref 12.0–15.0)
MCH: 33.8 pg (ref 26.0–34.0)
MCHC: 35.4 g/dL (ref 30.0–36.0)
MCV: 95.7 fL (ref 80.0–100.0)
Platelets: 184 10*3/uL (ref 150–400)
RBC: 3.96 MIL/uL (ref 3.87–5.11)
RDW: 13.2 % (ref 11.5–15.5)
WBC: 4.7 10*3/uL (ref 4.0–10.5)
nRBC: 0 % (ref 0.0–0.2)

## 2019-03-17 LAB — BASIC METABOLIC PANEL
Anion gap: 11 (ref 5–15)
BUN: 6 mg/dL (ref 6–20)
CO2: 22 mmol/L (ref 22–32)
Calcium: 8.7 mg/dL — ABNORMAL LOW (ref 8.9–10.3)
Chloride: 104 mmol/L (ref 98–111)
Creatinine, Ser: 0.62 mg/dL (ref 0.44–1.00)
GFR calc Af Amer: >60 mL/min (ref >60)
GFR calc non Af Amer: >60 mL/min (ref >60)
Glucose, Bld: 89 mg/dL (ref 70–99)
Potassium: 4.2 mmol/L (ref 3.5–5.1)
Sodium: 137 mmol/L (ref 135–145)

## 2019-03-17 LAB — TROPONIN I (HIGH SENSITIVITY)
Troponin I (High Sensitivity): <2 ng/L (ref <18)
Troponin I (High Sensitivity): <2 ng/L (ref <18)

## 2019-03-17 MED ORDER — SODIUM CHLORIDE 0.9% FLUSH: 3.0000 mL | Freq: Once | INTRAVENOUS | Status: DC

## 2019-03-17 MED ORDER — HYDROCORTISONE ACETATE 25 MG RE SUPP: 25.0000 mg | Freq: Two times a day (BID) | RECTAL | 1 refills | Status: DC

## 2019-03-17 MED ORDER — HYDROCHLOROTHIAZIDE 25 MG PO TABS: 25.0000 mg | ORAL_TABLET | Freq: Every day | ORAL | 2 refills | Status: DC

## 2019-03-17 NOTE — ED Notes (Signed)
Pt given phone to talk to transportation.

## 2019-03-17 NOTE — ED Triage Notes (Signed)
C?O chest pain, headache, and rectal bleeding x 3 days.  Patient is AAOx3.  Skin warm and dry. NAD

## 2019-03-17 NOTE — Discharge Instructions (Addendum)
Please seek medical attention for any high fevers, chest pain, shortness of breath, change in behavior, persistent vomiting, bloody stool or any other new or concerning symptoms.  

## 2019-03-17 NOTE — ED Notes (Addendum)
This RN introduced self to pt. Pt stating she has had CP, HA and rectal bleeding x3 days. Pt states she also hasn't been taking her BP meds for the x2 weeks due to not being able to afford them. Pt currently taking hydrochlorothiazide for BP that she hasn't refilled.

## 2019-03-17 NOTE — ED Provider Notes (Signed)
[facility] Emergency Department Provider Note    ____________________________________________   I have reviewed the triage vital signs and the nursing notes.   HISTORY  Chief Complaint Chest Pain, Headache, and Rectal Bleeding   History limited by: Not Limited   HPI [name] is a 58 y.o. female who presents to the emergency department today with complaints of rectal bleeding, headache and chest pain.  She states her rectal bleeding started 3 days ago.  She has noticed bright red blood.  It has been accompanied by some loose stools and a slight discomfort.  Has history of rectal bleeding in the past although never followed up with a GI doctor.  Patient additionally has been complaining of headache and chest pain that is been going on for roughly 1 day.  The headache is located in the front of her head and her chest pain is central and sharp.  She states she thinks it is related to her high blood pressure.  She has been off of her medication for the past 2 weeks.  She states she is on hydrochlorothiazide.  She does not have any refills left.  She denies any fevers or shortness of breath.    Records reviewed. Per medical record review patient has a history of being seen in the emergency department earlier this year for rectal bleeding. Was given GI follow up information.   Past Medical History:  Diagnosis Date  . Hypertension     There are no problems to display for this patient.   Past Surgical History:  Procedure Laterality Date  . BREAST BIOPSY Left 2011   CORE W/CLIP  . TUBAL LIGATION      Prior to Admission medications   Medication Sig Start Date End Date Taking? Authorizing Provider  amoxicillin (AMOXIL) 875 MG tablet Take 1 tablet (875 mg total) by mouth 2 (two) times daily. 03/25/15   Cuthriell, Delorise RoyalsJonathan D, PA-C  chlordiazePOXIDE (LIBRIUM) 25 MG capsule Day 1: take 50 mg (2 capsules) in AM, afternoon, and evening.  Day 2: take 50  mg (2 capsules) AM and PM.  Day 3: take 50 mg (2 capsules) in AM 12/29/18   Dionne BucySiadecki, Sebastian, MD  hydrochlorothiazide (HYDRODIURIL) 25 MG tablet Take 1 tablet (25 mg total) by mouth daily. 08/08/14   Sharman CheekStafford, Phillip, MD  hydrochlorothiazide (HYDRODIURIL) 25 MG tablet Take 1 tablet (25 mg total) by mouth daily. 12/29/18   Dionne BucySiadecki, Sebastian, MD  lisinopril (ZESTRIL) 10 MG tablet Take 1 tablet (10 mg total) by mouth daily for 7 days. 12/29/18 01/05/19  Dionne BucySiadecki, Sebastian, MD  magic mouthwash w/lidocaine SOLN Take 5 mLs by mouth 4 (four) times daily. 03/25/15   Cuthriell, Delorise RoyalsJonathan D, PA-C  oxyCODONE-acetaminophen (ROXICET) 5-325 MG per tablet Take 1 tablet by mouth every 8 (eight) hours as needed for moderate pain or severe pain (Do not drive or operate heavy machinery while taking as can cause drowsiness.). 08/28/14   Renford DillsMiller, Lindsey, NP  penicillin v potassium (VEETID) 500 MG tablet Take 1 tablet (500 mg total) by mouth 4 (four) times daily. 08/28/14   Renford DillsMiller, Lindsey, NP  polyethylene glycol [facility]AX / GLYCOLAX) packet Take 17 g by mouth daily. 06/15/18   Emily FilbertWilliams, Jonathan E, MD  triamcinolone ointment (KENALOG) 0.5 % Apply 1 application topically 2 (two) times daily. 08/22/14   Chinita Pesterriplett, Cari B, FNP    Allergies Patient has no known allergies.  No family history on file.  Social History Social History   Tobacco Use  .  Smoking status: Current Every Day Smoker    Packs/day: 0.33    Types: Cigarettes  . Smokeless tobacco: Never Used  Substance Use Topics  . Alcohol use: Yes    Comment: 3-4 40 oz beers per week  . Drug use: No    Review of Systems Constitutional: No fever/chills Eyes: No visual changes. ENT: No sore throat. Cardiovascular: Positive for chest pain. Respiratory: Denies shortness of breath. Gastrointestinal: No abdominal pain. Positive for rectal bleeding.   Genitourinary: Negative for dysuria. Musculoskeletal: Negative for back pain. Skin: Negative for  rash. Neurological: Positive for headache.  ____________________________________________   PHYSICAL EXAM:  VITAL SIGNS: ED Triage Vitals  Enc Vitals Group     BP 03/17/19 1219 (!) 190/113     Pulse Rate 03/17/19 1219 81     Resp 03/17/19 1219 16     Temp 03/17/19 1219 98.4 F (36.9 C)     Temp Source 03/17/19 1219 Oral     SpO2 03/17/19 1219 100 %     Weight 03/17/19 1208 160 lb (72.6 kg)     Height 03/17/19 1208 5\' 7"  (1.702 m)     Head Circumference --      Peak Flow --      Pain Score 03/17/19 1208 8   Constitutional: Alert and oriented.  Eyes: Conjunctivae are normal.  ENT      Head: Normocephalic and atraumatic.      Nose: No congestion/rhinnorhea.      Mouth/Throat: Mucous membranes are moist.      Neck: No stridor. Hematological/Lymphatic/Immunilogical: No cervical lymphadenopathy. Cardiovascular: Normal rate, regular rhythm.  No murmurs, rubs, or gallops.  Respiratory: Normal respiratory effort without tachypnea nor retractions. Breath sounds are clear and equal bilaterally. No wheezes/rales/rhonchi. Gastrointestinal: Soft and non tender. No rebound. No guarding.  Genitourinary: Deferred Musculoskeletal: Normal range of motion in all extremities. No lower extremity edema. Neurologic:  Normal speech and language. No gross focal neurologic deficits are appreciated.  Skin:  Skin is warm, dry and intact. No rash noted. Psychiatric: Mood and affect are normal. Speech and behavior are normal. Patient exhibits appropriate insight and judgment.  ____________________________________________    LABS (pertinent positives/negatives)  Trop hs <2 x 2 CBC wbc 4.7, hgb 13.4, plt 184 BMP wnl except ca 8.7  ____________________________________________   EKG  I, Nance Pear, attending physician, personally viewed and interpreted this EKG  EKG Time: 1211 Rate: 76 Rhythm: normal sinus rhythm Axis: normal Intervals: qtc 441 QRS: narrow ST changes: no st  elevation Impression: abnormal ekg   ____________________________________________    RADIOLOGY  CXR Lungs clear. Heart size normal.  ____________________________________________   PROCEDURES  Procedures  ____________________________________________   INITIAL IMPRESSION / ASSESSMENT AND PLAN / ED COURSE  Pertinent labs & imaging results that were available during my care of the patient were reviewed by me and considered in my medical decision making (see chart for details).   Patient presented to the emergency department today because of complaints for GI bleed, headache and chest pain.  Patient has history of GI bleed once in the past although has not followed up with GI doctor.  This point I do think it is likely hemorrhoid.  Patient states she does spend a long time on the toilet.  Will plan on discharging with prescription for Anusol and GI follow-up.  Additionally terms of the chest pain and the headache her blood pressure is elevated here.  Troponin negative x2.  I doubt ACS at this time.  Will  give patient prescription for her blood pressure medication.  Patient is working on establishing care with primary care doctor.  Encourage patient to continue this process.   ____________________________________________   FINAL CLINICAL IMPRESSION(S) / ED DIAGNOSES  Final diagnoses:  Rectal bleed  Chest pain, unspecified type  Hypertension, unspecified type  Nonintractable headache, unspecified chronicity pattern, unspecified headache type     Note: This dictation was prepared with Dragon dictation. Any transcriptional errors that result from this process are unintentional     Phineas Semen, MD 03/17/19 [phone]

## 2019-05-30 ENCOUNTER — Encounter: Payer: Self-pay | Admitting: Emergency Medicine

## 2019-05-30 ENCOUNTER — Other Ambulatory Visit: Payer: Self-pay

## 2019-05-30 ENCOUNTER — Emergency Department
Admission: EM | Admit: 2019-05-30 | Discharge: 2019-05-30 | Disposition: A | Discharge status: Home / Self Care | Payer: Self-pay | Attending: Emergency Medicine | Admitting: Emergency Medicine

## 2019-05-30 DIAGNOSIS — Z79899 Other long term (current) drug therapy: Secondary | ICD-10-CM | POA: Insufficient documentation

## 2019-05-30 DIAGNOSIS — I10 Essential (primary) hypertension: Secondary | ICD-10-CM | POA: Insufficient documentation

## 2019-05-30 DIAGNOSIS — F17210 Nicotine dependence, cigarettes, uncomplicated: Secondary | ICD-10-CM | POA: Insufficient documentation

## 2019-05-30 DIAGNOSIS — Y939 Activity, unspecified: Secondary | ICD-10-CM | POA: Insufficient documentation

## 2019-05-30 DIAGNOSIS — S61411A Laceration without foreign body of right hand, initial encounter: Secondary | ICD-10-CM | POA: Insufficient documentation

## 2019-05-30 DIAGNOSIS — Y929 Unspecified place or not applicable: Secondary | ICD-10-CM | POA: Insufficient documentation

## 2019-05-30 DIAGNOSIS — Y999 Unspecified external cause status: Secondary | ICD-10-CM | POA: Insufficient documentation

## 2019-05-30 LAB — BASIC METABOLIC PANEL
Anion gap: 12 (ref 5–15)
BUN: 6 mg/dL (ref 6–20)
CO2: 21 mmol/L — ABNORMAL LOW (ref 22–32)
Calcium: 8.1 mg/dL — ABNORMAL LOW (ref 8.9–10.3)
Chloride: 105 mmol/L (ref 98–111)
Creatinine, Ser: 0.6 mg/dL (ref 0.44–1.00)
GFR calc Af Amer: >60 mL/min (ref >60)
GFR calc non Af Amer: >60 mL/min (ref >60)
Glucose, Bld: 82 mg/dL (ref 70–99)
Potassium: 3.6 mmol/L (ref 3.5–5.1)
Sodium: 138 mmol/L (ref 135–145)

## 2019-05-30 LAB — CBC
HCT: 39.9 % (ref 36.0–46.0)
Hemoglobin: 13.8 g/dL (ref 12.0–15.0)
MCH: 34.9 pg — ABNORMAL HIGH (ref 26.0–34.0)
MCHC: 34.6 g/dL (ref 30.0–36.0)
MCV: 101 fL — ABNORMAL HIGH (ref 80.0–100.0)
Platelets: 215 10*3/uL (ref 150–400)
RBC: 3.95 MIL/uL (ref 3.87–5.11)
RDW: 12.5 % (ref 11.5–15.5)
WBC: 4.4 10*3/uL (ref 4.0–10.5)
nRBC: 0 % (ref 0.0–0.2)

## 2019-05-30 MED ORDER — LIDOCAINE HCL (PF) 1 % IJ SOLN: 10.0000 mL | Freq: Once | INTRAMUSCULAR | Status: DC

## 2019-05-30 MED ORDER — LIDOCAINE-EPINEPHRINE 2 %-1:100000 IJ SOLN
20.0000 mL | Freq: Once | INTRAMUSCULAR | Status: AC
  Administered 2019-05-30: 19:00:00 20 mL

## 2019-05-30 MED ORDER — LIDOCAINE HCL (PF) 1 % IJ SOLN
INTRAMUSCULAR | Status: AC
  Filled 2019-05-30: qty 10
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
## 2019-05-30 NOTE — ED Notes (Signed)
Pt hand started bleeding again, writer cleaned hand and dressed with abd pad and gauze.

## 2019-06-08 ENCOUNTER — Telehealth: Payer: Self-pay | Admitting: Gastroenterology

## 2019-06-08 NOTE — Telephone Encounter (Signed)
I called patient & l/m for her a message  to call back to make an appointment with DR Maximino Greenland f/u from ED 02-2019.per note from AH/mth

## 2019-06-09 ENCOUNTER — Other Ambulatory Visit: Payer: Self-pay

## 2019-06-09 ENCOUNTER — Emergency Department
Admission: EM | Admit: 2019-06-09 | Discharge: 2019-06-09 | Disposition: A | Discharge status: Home / Self Care | Payer: Self-pay | Attending: Emergency Medicine | Admitting: Emergency Medicine

## 2019-06-09 DIAGNOSIS — F17210 Nicotine dependence, cigarettes, uncomplicated: Secondary | ICD-10-CM | POA: Insufficient documentation

## 2019-06-09 DIAGNOSIS — Z79899 Other long term (current) drug therapy: Secondary | ICD-10-CM | POA: Insufficient documentation

## 2019-06-09 DIAGNOSIS — T148XXA Other injury of unspecified body region, initial encounter: Secondary | ICD-10-CM

## 2019-06-09 DIAGNOSIS — I10 Essential (primary) hypertension: Secondary | ICD-10-CM | POA: Insufficient documentation

## 2019-06-09 DIAGNOSIS — M25571 Pain in right ankle and joints of right foot: Secondary | ICD-10-CM | POA: Insufficient documentation

## 2019-06-09 DIAGNOSIS — L03115 Cellulitis of right lower limb: Secondary | ICD-10-CM | POA: Insufficient documentation

## 2019-06-09 DIAGNOSIS — X58XXXD Exposure to other specified factors, subsequent encounter: Secondary | ICD-10-CM | POA: Insufficient documentation

## 2019-06-09 DIAGNOSIS — L089 Local infection of the skin and subcutaneous tissue, unspecified: Secondary | ICD-10-CM | POA: Insufficient documentation

## 2019-06-09 DIAGNOSIS — S61411D Laceration without foreign body of right hand, subsequent encounter: Secondary | ICD-10-CM | POA: Insufficient documentation

## 2019-06-09 LAB — COMPREHENSIVE METABOLIC PANEL
ALT: 52 U/L — ABNORMAL HIGH (ref 0–44)
AST: 54 U/L — ABNORMAL HIGH (ref 15–41)
Albumin: 3.4 g/dL — ABNORMAL LOW (ref 3.5–5.0)
Alkaline Phosphatase: 78 U/L (ref 38–126)
Anion gap: 11 (ref 5–15)
BUN: 7 mg/dL (ref 6–20)
CO2: 24 mmol/L (ref 22–32)
Calcium: 8.7 mg/dL — ABNORMAL LOW (ref 8.9–10.3)
Chloride: 100 mmol/L (ref 98–111)
Creatinine, Ser: 0.59 mg/dL (ref 0.44–1.00)
GFR calc Af Amer: >60 mL/min (ref >60)
GFR calc non Af Amer: >60 mL/min (ref >60)
Glucose, Bld: 126 mg/dL — ABNORMAL HIGH (ref 70–99)
Potassium: 3.8 mmol/L (ref 3.5–5.1)
Sodium: 135 mmol/L (ref 135–145)
Total Bilirubin: 1.1 mg/dL (ref 0.3–1.2)
Total Protein: 7.7 g/dL (ref 6.5–8.1)

## 2019-06-09 LAB — CBC WITH DIFFERENTIAL/PLATELET
Abs Immature Granulocytes: 0.07 10*3/uL (ref 0.00–0.07)
Basophils Absolute: 0 10*3/uL (ref 0.0–0.1)
Basophils Relative: 1 %
Eosinophils Absolute: 0 10*3/uL (ref 0.0–0.5)
Eosinophils Relative: 0 %
HCT: 39.7 % (ref 36.0–46.0)
Hemoglobin: 13.7 g/dL (ref 12.0–15.0)
Immature Granulocytes: 1 %
Lymphocytes Relative: 25 %
Lymphs Abs: 2.1 10*3/uL (ref 0.7–4.0)
MCH: 34 pg (ref 26.0–34.0)
MCHC: 34.5 g/dL (ref 30.0–36.0)
MCV: 98.5 fL (ref 80.0–100.0)
Monocytes Absolute: 0.9 10*3/uL (ref 0.1–1.0)
Monocytes Relative: 11 %
Neutro Abs: 5.1 10*3/uL (ref 1.7–7.7)
Neutrophils Relative %: 62 %
Platelets: 262 10*3/uL (ref 150–400)
RBC: 4.03 MIL/uL (ref 3.87–5.11)
RDW: 12.1 % (ref 11.5–15.5)
WBC: 8.2 10*3/uL (ref 4.0–10.5)
nRBC: 0 % (ref 0.0–0.2)

## 2019-06-09 MED ORDER — LISINOPRIL 10 MG PO TABS: 10.0000 mg | ORAL_TABLET | Freq: Every day | ORAL | 0 refills | Status: DC

## 2019-06-09 MED ORDER — HYDROCODONE-ACETAMINOPHEN 5-325 MG PO TABS
1.0000 | ORAL_TABLET | Freq: Once | ORAL | Status: AC
  Administered 2019-06-09: 10:00:00 1 via ORAL
  Filled 2019-06-09: qty 1

## 2019-06-09 MED ORDER — HYDROCHLOROTHIAZIDE 25 MG PO TABS: 25.0000 mg | ORAL_TABLET | Freq: Every day | ORAL | 0 refills | Status: DC

## 2019-06-09 MED ORDER — CEPHALEXIN 500 MG PO CAPS: 500.0000 mg | ORAL_CAPSULE | Freq: Three times a day (TID) | ORAL | 0 refills | Status: DC

## 2019-06-09 MED ORDER — CEPHALEXIN 500 MG PO CAPS: 500.0000 mg | ORAL_CAPSULE | Freq: Three times a day (TID) | ORAL | 0 refills | Status: AC

## 2019-06-09 MED ORDER — HYDROCODONE-ACETAMINOPHEN 5-325 MG PO TABS: 1.0000 | ORAL_TABLET | Freq: Four times a day (QID) | ORAL | 0 refills | Status: DC | PRN

## 2019-06-09 NOTE — ED Notes (Addendum)
Patient has stitches to right hand present on arrival to ER (patient states stitches have been present approx 4 days). Area has dried blood present, minor redness present. Cleaned area per patient request with saline and chlorhexidine solution, followed by saline rinse.

## 2019-06-09 NOTE — ED Triage Notes (Signed)
Pt c/o right ankle pain for the past 2 days with redness and swelling, denies injury. States "I think a spider bit me".

## 2019-06-09 NOTE — ED Provider Notes (Signed)
[facility] Emergency Department Provider Note  ____________________________________________   First MD Initiated Contact with Patient 06/09/19 0900     (approximate)  I have reviewed the triage vital signs and the nursing notes.   HISTORY  Chief Complaint Ankle Pain   HPI [name] is a 59 y.o. female presents to the ED with complaint of right ankle pain for the last 2 days which has become red and swollen.  Patient had the impression that she had a spider bite.  She denies any fever or chills.  She is unaware of any injury.  She also had stitches to her right hand done approximately 4 days ago.  She states that the area is swollen and tender.  She has had some difficulty cleaning it.  She denies any fever or chills.  There has been no nausea or vomiting.       Past Medical History:  Diagnosis Date  . Hypertension     There are no problems to display for this patient.   Past Surgical History:  Procedure Laterality Date  . BREAST BIOPSY Left 2011   CORE W/CLIP  . TUBAL LIGATION      Prior to Admission medications   Medication Sig Start Date End Date Taking? Authorizing Provider  cephALEXin (KEFLEX) 500 MG capsule Take 1 capsule (500 mg total) by mouth 3 (three) times daily for 10 days. 06/09/19 06/19/19  Tommi Rumps, PA-C  chlordiazePOXIDE (LIBRIUM) 25 MG capsule Day 1: take 50 mg (2 capsules) in AM, afternoon, and evening.  Day 2: take 50 mg (2 capsules) AM and PM.  Day 3: take 50 mg (2 capsules) in AM 12/29/18   Dionne Bucy, MD  hydrochlorothiazide (HYDRODIURIL) 25 MG tablet Take 1 tablet (25 mg total) by mouth daily. 08/08/14   Sharman Cheek, MD  hydrochlorothiazide (HYDRODIURIL) 25 MG tablet Take 1 tablet (25 mg total) by mouth daily. 06/09/19 07/09/19  Tommi Rumps, PA-C  HYDROcodone-acetaminophen (NORCO/VICODIN) 5-325 MG tablet Take 1 tablet by mouth every 6 (six) hours as needed for moderate pain. 06/09/19    Tommi Rumps, PA-C  lisinopril (ZESTRIL) 10 MG tablet Take 1 tablet (10 mg total) by mouth daily for 10 days. 06/09/19 06/19/19  Tommi Rumps, PA-C    Allergies Patient has no known allergies.  No family history on file.  Social History Social History   Tobacco Use  . Smoking status: Current Every Day Smoker    Packs/day: 0.33    Types: Cigarettes  . Smokeless tobacco: Never Used  Substance Use Topics  . Alcohol use: Yes    Comment: 3-4 40 oz beers per week  . Drug use: No    Review of Systems Constitutional: No fever/chills Eyes: No visual changes. Cardiovascular: Denies chest pain. Respiratory: Denies shortness of breath. Musculoskeletal: Positive for right ankle pain. Skin: Positive for possible infection right hand. Neurological: Negative for headaches, focal weakness or numbness.   ____________________________________________   PHYSICAL EXAM:  VITAL SIGNS: ED Triage Vitals  Enc Vitals Group     BP 06/09/19 0842 (!) 164/106     Pulse Rate 06/09/19 0842 (!) 103     Resp 06/09/19 0842 17     Temp 06/09/19 0842 98.6 F (37 C)     Temp Source 06/09/19 0842 Oral     SpO2 06/09/19 0842 99 %     Weight 06/09/19 0843 160 lb (72.6 kg)     Height 06/09/19 0843 5\' 7"  (1.702 m)  Head Circumference --      Peak Flow --      Pain Score 06/09/19 0843 10     Pain Loc --      Pain Edu? --      Excl. in [place]? --     Constitutional: Alert and oriented. Well appearing and in no acute distress. Eyes: Conjunctivae are normal.  Head: Atraumatic. Neck: No stridor.   Cardiovascular: Normal rate, regular rhythm. Grossly normal heart sounds.  Good peripheral circulation. Respiratory: Normal respiratory effort.  No retractions. Lungs CTAB. Musculoskeletal: On the volar surface of the right hand there is a repaired laceration with sutures still intact.  Area appears to be slightly swollen but no drainage is noted.  There is also edema to the base of the thumb and  tenderness.  No warmth or redness is noted at this time.  Dried blood was still present and this was cleaned with normal saline.  The anterior portion of the lower tib-fib is erythematous, warm and moderately tender to palpation.  No open wound is noted along with no drainage.  Area appears to be a cellulitis with etiology unknown. Neurologic:  Normal speech and language. No gross focal neurologic deficits are appreciated. No gait instability. Skin:  Skin is warm, dry.  As noted above. Psychiatric: Mood and affect are normal. Speech and behavior are normal.  ____________________________________________   LABS (all labs ordered are listed, but only abnormal results are displayed)  Labs Reviewed  COMPREHENSIVE METABOLIC PANEL - Abnormal; Notable for the following components:      Result Value   Glucose, Bld 126 (*)    Calcium 8.7 (*)    Albumin 3.4 (*)    AST 54 (*)    ALT 52 (*)    All other components within normal limits  CBC WITH DIFFERENTIAL/PLATELET    PROCEDURES  Procedure(s) performed (including Critical Care):  Procedures   ____________________________________________   INITIAL IMPRESSION / ASSESSMENT AND PLAN / ED COURSE  As part of my medical decision making, I reviewed the following data within the electronic MEDICAL RECORD NUMBER Notes from prior ED visits and [place] Controlled Substance Database  59 year old female presents to the ED for an area of concern to her right ankle which she believed to be a spider bite.  Is become more red and painful.  Patient denies any fever or chills.  She also had a laceration to her right hand that was repaired 4 days ago and states that this also is tender to touch.  There is dried blood present which was cleaned prior to discharge.  Patient was given Norco while in the ED for pain.  It was also noted that most likely patient is not taking her blood pressure medication as she states she does not have a doctor and it looks like the last time  she got any medicine for hypertension in the ED was more than 6 months ago.  Patient is encouraged to obtain a PCP for management of her hypertension.  Patient was discharged with Keflex 500 mg 3 times daily and Norco as needed for pain.  A prescription for hydrochlorothiazide 25 mg and lisinopril 10 mg was written for 1 month in order for the patient to obtain a PCP at 1 the clinics listed on her discharge papers or also [facility] which [facility].  She is encouraged to use warm compresses to her leg and return to the emergency department if any severe worsening of her symptoms such  as fever, chills, nausea or vomiting.  ____________________________________________   FINAL CLINICAL IMPRESSION(S) / ED DIAGNOSES  Final diagnoses:  Cellulitis of right leg without foot  Infected wound  Hypertension, uncontrolled     ED Discharge Orders         Ordered    lisinopril (ZESTRIL) 10 MG tablet  Daily,   Status:  Discontinued     06/09/19 1027    hydrochlorothiazide (HYDRODIURIL) 25 MG tablet  Daily,   Status:  Discontinued     06/09/19 1027    HYDROcodone-acetaminophen (NORCO/VICODIN) 5-325 MG tablet  Every 6 hours PRN     06/09/19 1027    cephALEXin (KEFLEX) 500 MG capsule  3 times daily,   Status:  Discontinued     06/09/19 1027    cephALEXin (KEFLEX) 500 MG capsule  3 times daily     06/09/19 1107    hydrochlorothiazide (HYDRODIURIL) 25 MG tablet  Daily     06/09/19 1107    lisinopril (ZESTRIL) 10 MG tablet  Daily     06/09/19 1107           Note:  This document was prepared using Dragon voice recognition software and may include unintentional dictation errors.    Johnn Hai, PA-C 06/09/19 1636    Earleen Newport, MD 06/10/19 [phone]

## 2019-06-09 NOTE — Discharge Instructions (Signed)
You will need to find a primary care provider as soon as possible to help manage your high blood pressure.  A list of clinics including the [facility] is available to you.  Call to see which clinic is taking new patients and also when you can be seen at the [facility].  [facility].  Blood pressure medication was sent to your pharmacy.  Today your blood pressure is elevated at 164/106.  Antibiotics for your infection was sent to the pharmacy you need to take this every day until completely finished.  Norco every 6 hours as needed for pain.  You may use warm compresses to your right leg to help with your infection.  Also for your hand infection keep the area clean and dry.  Clean daily with mild soap and water and allowed to dry completely.  Return to have sutures removed when you are instructed at the time that it was sutured.  Return to the emergency department if any severe worsening of your symptoms.

## 2019-06-10 ENCOUNTER — Other Ambulatory Visit: Payer: Self-pay

## 2019-06-10 ENCOUNTER — Encounter: Payer: Self-pay | Admitting: Emergency Medicine

## 2019-06-10 ENCOUNTER — Inpatient Hospital Stay: Payer: Self-pay

## 2019-06-10 ENCOUNTER — Inpatient Hospital Stay
Admission: EM | Admit: 2019-06-10 | Discharge: 2019-06-12 | DRG: 916 | Disposition: A | Discharge status: Home / Self Care | Payer: Self-pay | Attending: Internal Medicine | Admitting: Internal Medicine

## 2019-06-10 ENCOUNTER — Emergency Department: Payer: Self-pay

## 2019-06-10 DIAGNOSIS — E871 Hypo-osmolality and hyponatremia: Secondary | ICD-10-CM | POA: Diagnosis present

## 2019-06-10 DIAGNOSIS — F17210 Nicotine dependence, cigarettes, uncomplicated: Secondary | ICD-10-CM | POA: Diagnosis present

## 2019-06-10 DIAGNOSIS — Z590 Homelessness: Secondary | ICD-10-CM

## 2019-06-10 DIAGNOSIS — T783XXA Angioneurotic edema, initial encounter: Principal | ICD-10-CM | POA: Diagnosis present

## 2019-06-10 DIAGNOSIS — T63301A Toxic effect of unspecified spider venom, accidental (unintentional), initial encounter: Secondary | ICD-10-CM | POA: Diagnosis present

## 2019-06-10 DIAGNOSIS — L03115 Cellulitis of right lower limb: Secondary | ICD-10-CM | POA: Diagnosis present

## 2019-06-10 DIAGNOSIS — R1310 Dysphagia, unspecified: Secondary | ICD-10-CM | POA: Diagnosis present

## 2019-06-10 DIAGNOSIS — E876 Hypokalemia: Secondary | ICD-10-CM | POA: Diagnosis present

## 2019-06-10 DIAGNOSIS — E860 Dehydration: Secondary | ICD-10-CM | POA: Diagnosis present

## 2019-06-10 DIAGNOSIS — Z20822 Contact with and (suspected) exposure to covid-19: Secondary | ICD-10-CM | POA: Diagnosis present

## 2019-06-10 DIAGNOSIS — I10 Essential (primary) hypertension: Secondary | ICD-10-CM | POA: Diagnosis present

## 2019-06-10 DIAGNOSIS — T361X5A Adverse effect of cephalosporins and other beta-lactam antibiotics, initial encounter: Secondary | ICD-10-CM | POA: Diagnosis present

## 2019-06-10 DIAGNOSIS — T464X5A Adverse effect of angiotensin-converting-enzyme inhibitors, initial encounter: Secondary | ICD-10-CM | POA: Diagnosis present

## 2019-06-10 LAB — CBC WITH DIFFERENTIAL/PLATELET
Abs Immature Granulocytes: 0.06 10*3/uL (ref 0.00–0.07)
Basophils Absolute: 0 10*3/uL (ref 0.0–0.1)
Basophils Relative: 1 %
Eosinophils Absolute: 0 10*3/uL (ref 0.0–0.5)
Eosinophils Relative: 0 %
HCT: 41.9 % (ref 36.0–46.0)
Hemoglobin: 14.4 g/dL (ref 12.0–15.0)
Immature Granulocytes: 1 %
Lymphocytes Relative: 29 %
Lymphs Abs: 2.4 10*3/uL (ref 0.7–4.0)
MCH: 34.5 pg — ABNORMAL HIGH (ref 26.0–34.0)
MCHC: 34.4 g/dL (ref 30.0–36.0)
MCV: 100.5 fL — ABNORMAL HIGH (ref 80.0–100.0)
Monocytes Absolute: 0.7 10*3/uL (ref 0.1–1.0)
Monocytes Relative: 8 %
Neutro Abs: 5.1 10*3/uL (ref 1.7–7.7)
Neutrophils Relative %: 61 %
Platelets: 270 10*3/uL (ref 150–400)
RBC: 4.17 MIL/uL (ref 3.87–5.11)
RDW: 12 % (ref 11.5–15.5)
WBC: 8.2 10*3/uL (ref 4.0–10.5)
nRBC: 0 % (ref 0.0–0.2)

## 2019-06-10 LAB — COMPREHENSIVE METABOLIC PANEL
ALT: 38 U/L (ref 0–44)
AST: 32 U/L (ref 15–41)
Albumin: 3.5 g/dL (ref 3.5–5.0)
Alkaline Phosphatase: 78 U/L (ref 38–126)
Anion gap: 13 (ref 5–15)
BUN: 6 mg/dL (ref 6–20)
CO2: 23 mmol/L (ref 22–32)
Calcium: 9.2 mg/dL (ref 8.9–10.3)
Chloride: 94 mmol/L — ABNORMAL LOW (ref 98–111)
Creatinine, Ser: 0.64 mg/dL (ref 0.44–1.00)
GFR calc Af Amer: >60 mL/min (ref >60)
GFR calc non Af Amer: >60 mL/min (ref >60)
Glucose, Bld: 104 mg/dL — ABNORMAL HIGH (ref 70–99)
Potassium: 3 mmol/L — ABNORMAL LOW (ref 3.5–5.1)
Sodium: 130 mmol/L — ABNORMAL LOW (ref 135–145)
Total Bilirubin: 1.1 mg/dL (ref 0.3–1.2)
Total Protein: 8.1 g/dL (ref 6.5–8.1)

## 2019-06-10 LAB — LACTIC ACID, PLASMA: Lactic Acid, Venous: 1.3 mmol/L (ref 0.5–1.9)

## 2019-06-10 MED ORDER — VANCOMYCIN HCL 750 MG/150ML IV SOLN
750.0000 mg | Freq: Two times a day (BID) | INTRAVENOUS | Status: DC
  Administered 2019-06-11: 750 mg via INTRAVENOUS
  Filled 2019-06-10 (×2): qty 150

## 2019-06-10 MED ORDER — FAMOTIDINE IN NACL 20-0.9 MG/50ML-% IV SOLN
20.0000 mg | Freq: Once | INTRAVENOUS | Status: AC
  Administered 2019-06-10: 20 mg via INTRAVENOUS
  Filled 2019-06-10: qty 50

## 2019-06-10 MED ORDER — DIPHENHYDRAMINE HCL 50 MG/ML IJ SOLN
12.5000 mg | Freq: Once | INTRAMUSCULAR | Status: AC
  Administered 2019-06-10: 12.5 mg via INTRAVENOUS
  Filled 2019-06-10: qty 1

## 2019-06-10 MED ORDER — LEVOFLOXACIN IN D5W 750 MG/150ML IV SOLN
750.0000 mg | Freq: Once | INTRAVENOUS | Status: AC
  Administered 2019-06-10: 750 mg via INTRAVENOUS
  Filled 2019-06-10: qty 150

## 2019-06-10 MED ORDER — VANCOMYCIN HCL 750 MG/150ML IV SOLN
750.0000 mg | Freq: Once | INTRAVENOUS | Status: AC
  Administered 2019-06-10: 750 mg via INTRAVENOUS
  Filled 2019-06-10: qty 150

## 2019-06-10 MED ORDER — METHYLPREDNISOLONE SODIUM SUCC 125 MG IJ SOLR
125.0000 mg | Freq: Once | INTRAMUSCULAR | Status: AC
  Administered 2019-06-10: 125 mg via INTRAVENOUS
  Filled 2019-06-10: qty 2

## 2019-06-10 MED ORDER — EPINEPHRINE 0.3 MG/0.3ML IJ SOAJ
0.3000 mg | Freq: Once | INTRAMUSCULAR | Status: AC
  Administered 2019-06-10: 0.3 mg via INTRAMUSCULAR
  Filled 2019-06-10: qty 0.3

## 2019-06-10 MED ORDER — VANCOMYCIN HCL IN DEXTROSE 1-5 GM/200ML-% IV SOLN
1000.0000 mg | Freq: Once | INTRAVENOUS | Status: AC
  Administered 2019-06-10: 1000 mg via INTRAVENOUS
  Filled 2019-06-10: qty 200

## 2019-06-10 MED ORDER — VANCOMYCIN HCL 750 MG/150ML IV SOLN
750.0000 mg | Freq: Once | INTRAVENOUS | Status: DC
  Filled 2019-06-10: qty 150

## 2019-06-10 NOTE — Progress Notes (Signed)
Pharmacy Antibiotic Note  [name] is a 59 y.o. female admitted on 06/10/2019 with cellulitis.  Pharmacy has been consulted for vancomycin dosing.  Plan: Patient received vanc 1.75g IV load in the ED  Vancomycin 750 mg IV Q 12 hrs. Goal AUC 400-550. Expected AUC: 432.9 SCr used: 0.8 Cssmin: 12.6  Will continue to monitor and will adjust as necessary.  Height: 5\' 7"  (170.2 cm) Weight: 160 lb (72.6 kg) IBW/kg (Calculated) : 61.6  Temp (24hrs), Avg:99.8 F (37.7 C), Min:99.5 F (37.5 C), Max:100 F (37.8 C)  Recent Labs  Lab 06/09/19 0927 06/10/19 1942  WBC 8.2 8.2  CREATININE 0.59 0.64  LATICACIDVEN  --  1.3    Estimated Creatinine Clearance: 74.5 mL/min (by C-G formula based on SCr of 0.64 mg/dL).    Allergies  Allergen Reactions  . Lisinopril Swelling    Thank you for allowing pharmacy to be a part of this patient's care.  06/12/19, PharmD, BCPS Clinical Pharmacist 06/10/2019 10:30 PM

## 2019-06-10 NOTE — H&P (Addendum)
History and Physical   [name] ZJI:[id number] DOB: 10-Jul-1960 DOA: 06/10/2019  Referring MD/NP/PA: Dr. Quentin Cornwall  PCP: Patient, No Pcp Per   Outpatient Specialists: None  Patient coming from: Home  Chief Complaint: Angioedema and right lower extremity swelling  HPI: [name] is a 59 y.o. female with medical history significant of hypertension who is homeless and was seen in the ER yesterday with suspected cellulitis of the right lower extremity due to spider bite.  Patient was given Keflex and lisinopril.  She returned to the hospital she has sudden angioedema with swelling of her lips and and worsening swelling of her right leg.  Patient still has lip swelling but is getting better.  No stridor.  No cough and no shortness of breath.  Her leg is more swollen for and tender.  Also warm.  She is suspected to have had angioedema from the lisinopril and failure outpatient treatment for the cellulitis.  She is being admitted to the hospital for treatment.  Patient is homeless.  Husband is in the nursing home she lost her home about 8 months ago..  ED Course: Temperature 98.9 blood pressure 161/118 pulse 99 respiratory rate of 20 oxygen sat 99% on room air.  Sodium is 130 potassium 3.0 chloride 94 CO2 23 with glucose 104.  CBC entirely within normal.  COVID-19 screen currently pending.  Patient being admitted with angioedema and cellulitis.  Review of Systems: As per HPI otherwise 10 point review of systems negative.    Past Medical History:  Diagnosis Date  . Hypertension     Past Surgical History:  Procedure Laterality Date  . BREAST BIOPSY Left 2011   CORE W/CLIP  . TUBAL LIGATION       reports that she has been smoking cigarettes. She has been smoking about 0.33 packs per day. She has never used smokeless tobacco. She reports previous alcohol use. She reports that she does not use drugs.  No Known Allergies  No family history on file.   Prior to  Admission medications   Medication Sig Start Date End Date Taking? Authorizing Provider  cephALEXin (KEFLEX) 500 MG capsule Take 1 capsule (500 mg total) by mouth 3 (three) times daily for 10 days. 06/09/19 06/19/19  Johnn Hai, PA-C  chlordiazePOXIDE (LIBRIUM) 25 MG capsule Day 1: take 50 mg (2 capsules) in AM, afternoon, and evening.  Day 2: take 50 mg (2 capsules) AM and PM.  Day 3: take 50 mg (2 capsules) in AM 12/29/18   Arta Silence, MD  hydrochlorothiazide (HYDRODIURIL) 25 MG tablet Take 1 tablet (25 mg total) by mouth daily. 08/08/14   Carrie Mew, MD  hydrochlorothiazide (HYDRODIURIL) 25 MG tablet Take 1 tablet (25 mg total) by mouth daily. 06/09/19 07/09/19  Johnn Hai, PA-C  HYDROcodone-acetaminophen (NORCO/VICODIN) 5-325 MG tablet Take 1 tablet by mouth every 6 (six) hours as needed for moderate pain. 06/09/19   Johnn Hai, PA-C  lisinopril (ZESTRIL) 10 MG tablet Take 1 tablet (10 mg total) by mouth daily for 10 days. 06/09/19 06/19/19  Johnn Hai, PA-C    Physical Exam: Vitals:   06/10/19 1920 06/10/19 1921 06/10/19 2008  BP:  (!) 161/118   Pulse:  99   Resp:  20   Temp:  99.5 F (37.5 C) 100 F (37.8 C)  TempSrc:  Oral Rectal  SpO2:  99%   Weight: 72.6 kg    Height: 5\' 7"  (1.702 m)  Constitutional: Disheveled, calm, no acute distress Vitals:   06/10/19 1920 06/10/19 1921 06/10/19 2008  BP:  (!) 161/118   Pulse:  99   Resp:  20   Temp:  99.5 F (37.5 C) 100 F (37.8 C)  TempSrc:  Oral Rectal  SpO2:  99%   Weight: 72.6 kg    Height: 5\' 7"  (1.702 m)     Eyes: PERRL, lids and conjunctivae normal ENMT: Mucous membranes are moist.  Lips are swollen, macrognathia, pharynx appears clear no obstruction Neck: normal, supple, no masses, no thyromegaly Respiratory: clear to auscultation bilaterally, no wheezing, no crackles. Normal respiratory effort. No accessory muscle use.  Cardiovascular: Regular rate and rhythm, no murmurs /  rubs / gallops. No extremity edema. 2+ pedal pulses. No carotid bruits.  Abdomen: no tenderness, no masses palpated. No hepatosplenomegaly. Bowel sounds positive.  Musculoskeletal: no clubbing / cyanosis.  Swollen, warm, tender, right lower extremity. Good ROM, no contractures. Normal muscle tone.  Skin: no rashes, lesions, ulcers. No induration, right lower extremity darker, warmer, no rashes but swollen Neurologic: CN 2-12 grossly intact. Sensation intact, DTR normal. Strength 5/5 in all 4.  Psychiatric: Normal judgment and insight. Alert and oriented x 3. Normal mood.     Labs on Admission: I have personally reviewed following labs and imaging studies  CBC: Recent Labs  Lab 06/09/19 0927 06/10/19 1942  WBC 8.2 8.2  NEUTROABS 5.1 5.1  HGB 13.7 14.4  HCT 39.7 41.9  MCV 98.5 100.5*  PLT 262 270   Basic Metabolic Panel: Recent Labs  Lab 06/09/19 0927 06/10/19 1942  NA 135 130*  K 3.8 3.0*  CL 100 94*  CO2 24 23  GLUCOSE 126* 104*  BUN 7 6  CREATININE 0.59 0.64  CALCIUM 8.7* 9.2   GFR: Estimated Creatinine Clearance: 74.5 mL/min (by C-G formula based on SCr of 0.64 mg/dL). Liver Function Tests: Recent Labs  Lab 06/09/19 0927 06/10/19 1942  AST 54* 32  ALT 52* 38  ALKPHOS 78 78  BILITOT 1.1 1.1  PROT 7.7 8.1  ALBUMIN 3.4* 3.5   No results for input(s): LIPASE, AMYLASE in the last 168 hours. No results for input(s): AMMONIA in the last 168 hours. Coagulation Profile: No results for input(s): INR, PROTIME in the last 168 hours. Cardiac Enzymes: No results for input(s): CKTOTAL, CKMB, CKMBINDEX, TROPONINI in the last 168 hours. BNP (last 3 results) No results for input(s): PROBNP in the last 8760 hours. HbA1C: No results for input(s): HGBA1C in the last 72 hours. CBG: No results for input(s): GLUCAP in the last 168 hours. Lipid Profile: No results for input(s): CHOL, HDL, LDLCALC, TRIG, CHOLHDL, LDLDIRECT in the last 72 hours. Thyroid Function Tests: No  results for input(s): TSH, T4TOTAL, FREET4, T3FREE, THYROIDAB in the last 72 hours. Anemia Panel: No results for input(s): VITAMINB12, FOLATE, FERRITIN, TIBC, IRON, RETICCTPCT in the last 72 hours. Urine analysis:    Component Value Date/Time   COLORURINE YELLOW (A) 09/08/2018 2117   APPEARANCEUR CLEAR (A) 09/08/2018 2117   LABSPEC 1.005 09/08/2018 2117   PHURINE 6.0 09/08/2018 2117   GLUCOSEU NEGATIVE 09/08/2018 2117   HGBUR NEGATIVE 09/08/2018 2117   BILIRUBINUR NEGATIVE 09/08/2018 2117   KETONESUR NEGATIVE 09/08/2018 2117   PROTEINUR NEGATIVE 09/08/2018 2117   NITRITE NEGATIVE 09/08/2018 2117   LEUKOCYTESUR TRACE (A) 09/08/2018 2117   Sepsis Labs: @LABRCNTIP (procalcitonin:4,lacticidven:4) )No results found for this or any previous visit (from the past 240 hour(s)).   Radiological Exams on Admission: DG Tibia/Fibula  Right  Result Date: 06/10/2019 CLINICAL DATA:  Right leg pain. EXAM: RIGHT TIBIA AND FIBULA - 2 VIEW COMPARISON:  None. FINDINGS: There is no evidence of fracture or other focal bone lesions. Soft tissues are unremarkable. IMPRESSION: Negative. Electronically Signed   By: Aram Candela M.D.   On: 06/10/2019 20:51   DG Chest Portable 1 View  Result Date: 06/10/2019 CLINICAL DATA:  59 year old female with fever, mouth angioedema. Started an Ace inhibitor yesterday. EXAM: PORTABLE CHEST 1 VIEW COMPARISON:  Chest radiographs 03/17/2019 and earlier. FINDINGS: Portable AP upright view at 2038 hours. Chronic tortuosity of the thoracic aorta. Stable cardiac size at the upper limits of normal. Other mediastinal contours are within normal limits. Visualized tracheal air column is within normal limits. Allowing for portable technique the lungs are clear. No pneumothorax. No acute osseous abnormality identified. IMPRESSION: No acute cardiopulmonary abnormality. Electronically Signed   By: Odessa Fleming M.D.   On: 06/10/2019 20:51      Assessment/Plan Principal Problem:    Cellulitis of right lower leg Active Problems:   Benign essential HTN   Angioedema   Hyponatremia   Hypokalemia     #1 cellulitis of the right lower extremity: This followed a spider bite.  Patient will be admitted.  Initiated on vancomycin and Levaquin.  Blood cultures obtained.  Pain management as well as elevation of the foot.  Follow resolution.  #2 angioedema: Empiric steroids with Benadryl.  Swelling is slowly improving.  #3 hyponatremia: Most likely due to dehydration.  Hydrate and monitor.  #4 hypokalemia: We will replete potassium.  #5 hypertension: Patient has ACE inhibitor allergy.  Will initiate amlodipine while in the hospital and monitor.  #6 homelessness: Patient will need social worker consult.  May be able to go to the shelter after discharge   DVT prophylaxis: Lovenox Code Status: Full code Family Communication: No family at bedside Disposition Plan: To be determined.  Probably to the shelter Consults called: None Admission status: Inpatient  Severity of Illness: The appropriate patient status for this patient is INPATIENT. Inpatient status is judged to be reasonable and necessary in order to provide the required intensity of service to ensure the patient's safety. The patient's presenting symptoms, physical exam findings, and initial radiographic and laboratory data in the context of their chronic comorbidities is felt to place them at high risk for further clinical deterioration. Furthermore, it is not anticipated that the patient will be medically stable for discharge from the hospital within 2 midnights of admission. The following factors support the patient status of inpatient.   " The patient's presenting symptoms include swollen face and lips. " The worrisome physical exam findings include evidence of angioedema with right lower extremity cellulitis. " The initial radiographic and laboratory data are worrisome because of none. " The chronic co-morbidities  include hypertension.   * I certify that at the point of admission it is my clinical judgment that the patient will require inpatient hospital care spanning beyond 2 midnights from the point of admission due to high intensity of service, high risk for further deterioration and high frequency of surveillance required.Lonia Blood MD [facility] Pager [phone]  If 7PM-7AM, please contact night-coverage [email or web address] Password [facility]  06/10/2019, 9:34 PM

## 2019-06-10 NOTE — ED Provider Notes (Signed)
[facility] Emergency Department Provider Note    First MD Initiated Contact with Patient 06/10/19 1913     (approximate)  I have reviewed the triage vital signs and the nursing notes.   HISTORY  Chief Complaint Angioedema    HPI [name] is a 59 y.o. female presents to the ER for 24 hours of lip swelling.  Patient was seen here yesterday for pain of her right ankle thinks that she had a spider bite to that area.  She started and Keflex as well as was prescribed lisinopril.  She does have some discomfort with swallowing but is protecting her airway.  Swelling was first noted yesterday and has been slowly worsening over the course of the day.    Past Medical History:  Diagnosis Date  . Hypertension    No family history on file. Past Surgical History:  Procedure Laterality Date  . BREAST BIOPSY Left 2011   CORE W/CLIP  . TUBAL LIGATION     Patient Active Problem List   Diagnosis Date Noted  . Cellulitis of right lower leg 06/10/2019  . Benign essential HTN 06/10/2019  . Angioedema 06/10/2019  . Hyponatremia 06/10/2019  . Hypokalemia 06/10/2019      Prior to Admission medications   Medication Sig Start Date End Date Taking? Authorizing Provider  cephALEXin (KEFLEX) 500 MG capsule Take 1 capsule (500 mg total) by mouth 3 (three) times daily for 10 days. 06/09/19 06/19/19 Yes Summers, Rhonda L, PA-C  hydrochlorothiazide (HYDRODIURIL) 25 MG tablet Take 1 tablet (25 mg total) by mouth daily. 06/09/19 07/09/19 Yes Tommi Rumps, PA-C  HYDROcodone-acetaminophen (NORCO/VICODIN) 5-325 MG tablet Take 1 tablet by mouth every 6 (six) hours as needed for moderate pain. 06/09/19  Yes Bridget Hartshorn L, PA-C  lisinopril (ZESTRIL) 10 MG tablet Take 1 tablet (10 mg total) by mouth daily for 10 days. 06/09/19 06/19/19 Yes Tommi Rumps, PA-C    Allergies Lisinopril    Social History Social History   Tobacco Use  . Smoking status: Current  Every Day Smoker    Packs/day: 0.33    Types: Cigarettes  . Smokeless tobacco: Never Used  Substance Use Topics  . Alcohol use: Not Currently    Comment: 3-4 40 oz beers per week  . Drug use: No    Review of Systems Patient denies headaches, rhinorrhea, blurry vision, numbness, shortness of breath, chest pain, edema, cough, abdominal pain, nausea, vomiting, diarrhea, dysuria, fevers, rashes or hallucinations unless otherwise stated above in HPI. ____________________________________________   PHYSICAL EXAM:  VITAL SIGNS: Vitals:   06/10/19 2100 06/10/19 2200  BP:  (!) 159/144  Pulse: 91 94  Resp: 20 17  Temp:    SpO2: 100% 100%    Constitutional: Alert and oriented.  Eyes: Conjunctivae are normal.  Head: Atraumatic. Nose: No congestion/rhinnorhea. Mouth/Throat: Mucous membranes are moist. Uvula midline,  Angioedema of lower lip, no sublingual firmness or erythema  Neck: No stridor. Painless ROM.  Cardiovascular: Normal rate, regular rhythm. Grossly normal heart sounds.  Good peripheral circulation. Respiratory: Normal respiratory effort.  No retractions. Lungs CTAB. Gastrointestinal: Soft and nontender. No distention. No abdominal bruits. No CVA tenderness. Genitourinary:  Musculoskeletal: RLE swelling and erythema, no crepitus, no fluctuance Neurologic:  Normal speech and language. No gross focal neurologic deficits are appreciated. No facial droop Skin:  Skin is warm, dry and intact. No rash noted.  Laceration of right thenar eminence, no surrounding erythema or purulent drainage Psychiatric: Mood and affect are  normal. Speech and behavior are normal.  ____________________________________________   LABS (all labs ordered are listed, but only abnormal results are displayed)  Results for orders placed or performed during the hospital encounter of 06/10/19 (from the past 24 hour(s))  CBC with Differential/Platelet     Status: Abnormal   Collection Time: 06/10/19  7:42  PM  Result Value Ref Range   WBC 8.2 4.0 - 10.5 K/uL   RBC 4.17 3.87 - 5.11 MIL/uL   Hemoglobin 14.4 12.0 - 15.0 g/dL   HCT 41.9 36.0 - 46.0 %   MCV 100.5 (H) 80.0 - 100.0 fL   MCH 34.5 (H) 26.0 - 34.0 pg   MCHC 34.4 30.0 - 36.0 g/dL   RDW 12.0 11.5 - 15.5 %   Platelets 270 150 - 400 K/uL   nRBC 0.0 0.0 - 0.2 %   Neutrophils Relative % 61 %   Neutro Abs 5.1 1.7 - 7.7 K/uL   Lymphocytes Relative 29 %   Lymphs Abs 2.4 0.7 - 4.0 K/uL   Monocytes Relative 8 %   Monocytes Absolute 0.7 0.1 - 1.0 K/uL   Eosinophils Relative 0 %   Eosinophils Absolute 0.0 0.0 - 0.5 K/uL   Basophils Relative 1 %   Basophils Absolute 0.0 0.0 - 0.1 K/uL   Immature Granulocytes 1 %   Abs Immature Granulocytes 0.06 0.00 - 0.07 K/uL  Comprehensive metabolic panel     Status: Abnormal   Collection Time: 06/10/19  7:42 PM  Result Value Ref Range   Sodium 130 (L) 135 - 145 mmol/L   Potassium 3.0 (L) 3.5 - 5.1 mmol/L   Chloride 94 (L) 98 - 111 mmol/L   CO2 23 22 - 32 mmol/L   Glucose, Bld 104 (H) 70 - 99 mg/dL   BUN 6 6 - 20 mg/dL   Creatinine, Ser 0.64 0.44 - 1.00 mg/dL   Calcium 9.2 8.9 - 10.3 mg/dL   Total Protein 8.1 6.5 - 8.1 g/dL   Albumin 3.5 3.5 - 5.0 g/dL   AST 32 15 - 41 U/L   ALT 38 0 - 44 U/L   Alkaline Phosphatase 78 38 - 126 U/L   Total Bilirubin 1.1 0.3 - 1.2 mg/dL   GFR calc non Af Amer >60 >60 mL/min   GFR calc Af Amer >60 >60 mL/min   Anion gap 13 5 - 15  Lactic acid, plasma     Status: None   Collection Time: 06/10/19  7:42 PM  Result Value Ref Range   Lactic Acid, Venous 1.3 0.5 - 1.9 mmol/L   ____________________________________________  EKG My review and personal interpretation at Time: 19:23   Indication: lip swelling  Rate: 95  Rhythm: sinus Axis: normal Other: normal intervals, no stemi ____________________________________________  RADIOLOGY  I personally reviewed all radiographic images ordered to evaluate for the above acute complaints and reviewed radiology reports  and findings.  These findings were personally discussed with the patient.  Please see medical record for radiology report.  ____________________________________________   PROCEDURES  Procedure(s) performed:  .Critical Care Performed by: Merlyn Lot, MD Authorized by: Merlyn Lot, MD   Critical care provider statement:    Critical care time (minutes):  40   Critical care time was exclusive of:  Separately billable procedures and treating other patients   Critical care was necessary to treat or prevent imminent or life-threatening deterioration of the following conditions: angioedema.   Critical care was time spent personally by me on the following activities:  Development of treatment plan with patient or surrogate, discussions with consultants, evaluation of patient's response to treatment, examination of patient, obtaining history from patient or surrogate, ordering and performing treatments and interventions, ordering and review of laboratory studies, ordering and review of radiographic studies, pulse oximetry, re-evaluation of patient's condition and review of old charts      Critical Care performed: yes ____________________________________________   INITIAL IMPRESSION / ASSESSMENT AND PLAN / ED COURSE  Pertinent labs & imaging results that were available during my care of the patient were reviewed by me and considered in my medical decision making (see chart for details).   DDX: angioedema, cellulitis, sepsis, ludwigs, anaphylaxis  [name] is a 58 y.o. who presents to the ED with presentation as described above.  She is currently protecting her airway.  Does have angioedema but also is febrile.  Doubt odontogenic infection it really does appear consistent with angioedema and she was just started on lisinopril.  Also possible anaphylactic reaction to Keflex.  Will give epi Pepcid, Benadryl and steroid.  Will get blood work.  The patient will be placed on  continuous pulse oximetry and telemetry for monitoring.  Laboratory evaluation will be sent to evaluate for the above complaints.     Clinical Course as of Jun 10 8  Thu Jun 10, 2019  2003 Reassessed.  Feels like the swelling is somewhat better.   [PR]  2020 Rechecked again.  Her core temperature is 100.  I am concerned for underlying infection.  Somewhat of a mixed picture given recent started lisinopril as well as Keflex.  I think this is most likely angioedema related to lisinopril.  She has normal phonation.  No impending airway compromise by serial exams but will continue to observe.  She will likely require hospitalization.   [PR]  2110 Patient reassessed.  Still has some angioedema but is certainly getting softer.  She does have evidence of cellulitic change in the right leg given her reaction fever persistent fevers and symptoms I do believe she would benefit from observation the hospital station.  She does have some irritated appearing sutures in her left hand but there is no purulence or surrounding erythema there I think her fever is related to the right lower extremity and that is where she is complaining of pain.   [PR]    Clinical Course User Index [PR] Willy Eddy, MD    The patient was evaluated in Emergency Department today for the symptoms described in the history of present illness. He/she was evaluated in the context of the global COVID-19 pandemic, which necessitated consideration that the patient might be at risk for infection with the SARS-CoV-2 virus that causes COVID-19. Institutional protocols and algorithms that pertain to the evaluation of patients at risk for COVID-19 are in a state of rapid change based on information released by regulatory bodies including the CDC and federal and state organizations. These policies and algorithms were followed during the patient's care in the ED.  As part of my medical decision making, I reviewed the following data within the  electronic MEDICAL RECORD NUMBER Nursing notes reviewed and incorporated, Labs reviewed, notes from prior ED visits and [place] Controlled Substance Database   ____________________________________________   FINAL CLINICAL IMPRESSION(S) / ED DIAGNOSES  Final diagnoses:  Cellulitis of right lower extremity  Angioedema, initial encounter      NEW MEDICATIONS STARTED DURING THIS VISIT:  New Prescriptions   No medications on file     Note:  This  document was prepared using Conservation officer, historic buildings and may include unintentional dictation errors.    Willy Eddy, MD 06/11/19 [phone]

## 2019-06-10 NOTE — Consult Note (Addendum)
PHARMACY -  BRIEF ANTIBIOTIC NOTE   Pharmacy has received consult(s) for cellulitis from an ED provider.  The patient's profile has been reviewed for ht/wt/allergies/indication/available labs.    One time order(s) placed for Levofloxacin and vancomycin. NKDA noted and RN spoke with patient and she does not remember a PCN allergy and has taking Keflex in the past. Patient presents with angioedema after starting keflex. Dr. Roxan Hockey would like to continue with the leveflox. Recommended to switch to ceftriaxone. Angioedema more likely due to lisinopril, but medical team does not want to test it.   Further antibiotics/pharmacy consults should be ordered by admitting physician if indicated.                       Thank you, Ronnald Ramp 06/10/2019  8:41 PM

## 2019-06-10 NOTE — ED Triage Notes (Signed)
Pt to ED from home c/o angioedema to lips, states sore throat, denies difficulty breathing at this time.  Patient started lisinopril for first time yesterday.  States woke up to the swelling today.  Also taking keflex for a spider bite.  Patient presents A&Ox4, chest rise even and unlabored.  Dr. Roxan Hockey in at bedside for assessment.

## 2019-06-11 LAB — COMPREHENSIVE METABOLIC PANEL
ALT: 30 U/L (ref 0–44)
AST: 37 U/L (ref 15–41)
Albumin: 3 g/dL — ABNORMAL LOW (ref 3.5–5.0)
Alkaline Phosphatase: 76 U/L (ref 38–126)
Anion gap: 11 (ref 5–15)
BUN: 8 mg/dL (ref 6–20)
CO2: 24 mmol/L (ref 22–32)
Calcium: 8.8 mg/dL — ABNORMAL LOW (ref 8.9–10.3)
Chloride: 96 mmol/L — ABNORMAL LOW (ref 98–111)
Creatinine, Ser: 0.7 mg/dL (ref 0.44–1.00)
GFR calc Af Amer: >60 mL/min (ref >60)
GFR calc non Af Amer: >60 mL/min (ref >60)
Glucose, Bld: 146 mg/dL — ABNORMAL HIGH (ref 70–99)
Potassium: 3.5 mmol/L (ref 3.5–5.1)
Sodium: 131 mmol/L — ABNORMAL LOW (ref 135–145)
Total Bilirubin: 1 mg/dL (ref 0.3–1.2)
Total Protein: 7.7 g/dL (ref 6.5–8.1)

## 2019-06-11 LAB — CBC
HCT: 38.2 % (ref 36.0–46.0)
Hemoglobin: 13.3 g/dL (ref 12.0–15.0)
MCH: 35 pg — ABNORMAL HIGH (ref 26.0–34.0)
MCHC: 34.8 g/dL (ref 30.0–36.0)
MCV: 100.5 fL — ABNORMAL HIGH (ref 80.0–100.0)
Platelets: 278 10*3/uL (ref 150–400)
RBC: 3.8 MIL/uL — ABNORMAL LOW (ref 3.87–5.11)
RDW: 11.9 % (ref 11.5–15.5)
WBC: 6.7 10*3/uL (ref 4.0–10.5)
nRBC: 0 % (ref 0.0–0.2)

## 2019-06-11 LAB — HIV ANTIBODY (ROUTINE TESTING W REFLEX): HIV Screen 4th Generation wRfx: NONREACTIVE

## 2019-06-11 LAB — SARS CORONAVIRUS 2 (TAT 6-24 HRS): SARS Coronavirus 2: NEGATIVE

## 2019-06-11 MED ORDER — FAMOTIDINE 20 MG PO TABS
10.0000 mg | ORAL_TABLET | Freq: Two times a day (BID) | ORAL | Status: DC
  Administered 2019-06-11 – 2019-06-12 (×3): 10 mg via ORAL
  Filled 2019-06-11 (×3): qty 1

## 2019-06-11 MED ORDER — NICOTINE 21 MG/24HR TD PT24
21.0000 mg | MEDICATED_PATCH | Freq: Every day | TRANSDERMAL | Status: DC
  Administered 2019-06-11: 21 mg via TRANSDERMAL
  Filled 2019-06-11: qty 1

## 2019-06-11 MED ORDER — METOPROLOL TARTRATE 25 MG PO TABS: 12.5000 mg | ORAL_TABLET | Freq: Two times a day (BID) | ORAL | Status: DC

## 2019-06-11 MED ORDER — ONDANSETRON HCL 4 MG/2ML IJ SOLN: 4.0000 mg | Freq: Four times a day (QID) | INTRAMUSCULAR | Status: DC | PRN

## 2019-06-11 MED ORDER — DIPHENHYDRAMINE HCL 25 MG PO CAPS: 25.0000 mg | ORAL_CAPSULE | Freq: Three times a day (TID) | ORAL | Status: DC | PRN

## 2019-06-11 MED ORDER — PREDNISONE 20 MG PO TABS
20.0000 mg | ORAL_TABLET | Freq: Two times a day (BID) | ORAL | Status: DC
  Administered 2019-06-11 (×2): 20 mg via ORAL
  Filled 2019-06-11 (×3): qty 1

## 2019-06-11 MED ORDER — DIPHENHYDRAMINE HCL 25 MG PO CAPS
25.0000 mg | ORAL_CAPSULE | Freq: Four times a day (QID) | ORAL | Status: DC | PRN
  Administered 2019-06-11: 25 mg via ORAL
  Filled 2019-06-11: qty 1

## 2019-06-11 MED ORDER — AMLODIPINE BESYLATE 5 MG PO TABS
5.0000 mg | ORAL_TABLET | Freq: Every day | ORAL | Status: DC
  Administered 2019-06-11: 5 mg via ORAL
  Filled 2019-06-11: qty 1

## 2019-06-11 MED ORDER — SODIUM CHLORIDE 0.9 % IV SOLN: INTRAVENOUS | Status: DC

## 2019-06-11 MED ORDER — CEFAZOLIN SODIUM-DEXTROSE 1-4 GM/50ML-% IV SOLN
1.0000 g | Freq: Three times a day (TID) | INTRAVENOUS | Status: DC
  Administered 2019-06-11 – 2019-06-12 (×3): 1 g via INTRAVENOUS
  Filled 2019-06-11 (×6): qty 50

## 2019-06-11 MED ORDER — ACETAMINOPHEN 650 MG RE SUPP: 650.0000 mg | Freq: Four times a day (QID) | RECTAL | Status: DC | PRN

## 2019-06-11 MED ORDER — ENOXAPARIN SODIUM 40 MG/0.4ML ~~LOC~~ SOLN
40.0000 mg | SUBCUTANEOUS | Status: DC
  Administered 2019-06-11 – 2019-06-12 (×2): 40 mg via SUBCUTANEOUS
  Filled 2019-06-11 (×2): qty 0.4

## 2019-06-11 MED ORDER — ACETAMINOPHEN 325 MG PO TABS
650.0000 mg | ORAL_TABLET | Freq: Four times a day (QID) | ORAL | Status: DC | PRN
  Administered 2019-06-11: 650 mg via ORAL
  Filled 2019-06-11: qty 2

## 2019-06-11 MED ORDER — HYDROCODONE-ACETAMINOPHEN 5-325 MG PO TABS
1.0000 | ORAL_TABLET | ORAL | Status: DC | PRN
  Administered 2019-06-11: 1 via ORAL
  Filled 2019-06-11: qty 1

## 2019-06-11 MED ORDER — LEVOFLOXACIN IN D5W 500 MG/100ML IV SOLN: 500.0000 mg | INTRAVENOUS | Status: DC

## 2019-06-11 MED ORDER — ONDANSETRON HCL 4 MG PO TABS: 4.0000 mg | ORAL_TABLET | Freq: Four times a day (QID) | ORAL | Status: DC | PRN

## 2019-06-11 NOTE — Progress Notes (Signed)
[facility]  - [place] [facility]   PATIENT NAME: [name]    MR#:  [id number]  DATE OF BIRTH:  [birth year]  SUBJECTIVE:   Patient came in after she noticed her lip and cheek swelling. This happened after she took one pill of lisinopril there was given to her in the emergency room on 24th March. She also has right lower extremity superficial cellulitis. Currently getting IV antibiotics.  Porch she is homeless however does have a place to stay with running water. Her husband is in a nursing home.   REVIEW OF SYSTEMS:   Review of Systems  Constitutional: Negative for chills, fever and weight loss.  HENT: Negative for ear discharge, ear pain and nosebleeds.   Eyes: Negative for blurred vision, pain and discharge.  Respiratory: Negative for sputum production, shortness of breath, wheezing and stridor.   Cardiovascular: Negative for chest pain, palpitations, orthopnea and PND.  Gastrointestinal: Negative for abdominal pain, diarrhea, nausea and vomiting.  Genitourinary: Negative for frequency and urgency.  Musculoskeletal: Negative for back pain and joint pain.  Skin:       Right lower extremity tibial shin redness with feeling warm  Neurological: Negative for sensory change, speech change, focal weakness and weakness.  Psychiatric/Behavioral: Negative for depression and hallucinations. The patient is not nervous/anxious.    Tolerating Diet: yes Tolerating PT:   DRUG ALLERGIES:   Allergies  Allergen Reactions  . Lisinopril Swelling    VITALS:  Blood pressure 124/83, pulse 85, temperature 98.7 F (37.1 C), temperature source Oral, resp. rate 15, height 5\' 7"  (1.702 m), weight 72.6 kg, SpO2 99 %.  PHYSICAL EXAMINATION:   Physical Exam  GENERAL:  59 y.o.-year-old patient lying in the bed with no acute distress.  EYES: Pupils equal, round, reactive to light and accommodation. No scleral icterus.   HEENT: Head atraumatic, normocephalic. Oropharynx and  nasopharynx clear. Swollen upper lip lower lip and cheeks NECK:  Supple, no jugular venous distention. No thyroid enlargement, no tenderness.  LUNGS: Normal breath sounds bilaterally, no wheezing, rales, rhonchi. No use of accessory muscles of respiration.  CARDIOVASCULAR: S1, S2 normal. No murmurs, rubs, or gallops.  ABDOMEN: Soft, nontender, nondistended. Bowel sounds present. No organomegaly or mass.  EXTREMITIES: right tibial shin cellulitis NEUROLOGIC: Cranial nerves II through XII are intact. No focal Motor or sensory deficits b/l.   PSYCHIATRIC:  patient is alert and oriented x 3.  SKIN: No obvious rash, lesion, or ulcer.   LABORATORY PANEL:  CBC Recent Labs  Lab 06/11/19 0529  WBC 6.7  HGB 13.3  HCT 38.2  PLT 278    Chemistries  Recent Labs  Lab 06/11/19 0529  NA 131*  K 3.5  CL 96*  CO2 24  GLUCOSE 146*  BUN 8  CREATININE 0.70  CALCIUM 8.8*  AST 37  ALT 30  ALKPHOS 76  BILITOT 1.0   Cardiac Enzymes No results for input(s): TROPONINI in the last 168 hours. RADIOLOGY:  DG Tibia/Fibula Right  Result Date: 06/10/2019 CLINICAL DATA:  Right leg pain. EXAM: RIGHT TIBIA AND FIBULA - 2 VIEW COMPARISON:  None. FINDINGS: There is no evidence of fracture or other focal bone lesions. Soft tissues are unremarkable. IMPRESSION: Negative. Electronically Signed   By: 06/12/2019 M.D.   On: 06/10/2019 20:51   06/12/2019 Venous Img Lower Unilateral Right  Result Date: 06/10/2019 CLINICAL DATA:  Right leg swelling EXAM: RIGHT LOWER EXTREMITY VENOUS DOPPLER ULTRASOUND TECHNIQUE: Gray-scale sonography with compression, as well as color  and duplex ultrasound, were performed to evaluate the deep venous system(s) from the level of the common femoral vein through the popliteal and proximal calf veins. COMPARISON:  None. FINDINGS: VENOUS Normal compressibility of the common femoral, superficial femoral, and popliteal veins, as well as the visualized calf veins. Visualized portions of  profunda femoral vein and great saphenous vein unremarkable. No filling defects to suggest DVT on grayscale or color Doppler imaging. Doppler waveforms show normal direction of venous flow, normal respiratory phasicity and response to augmentation. Limited views of the contralateral common femoral vein are unremarkable. OTHER Prominent, elongated lymph node in the right groin with a short axis diameter of 8 mm and normal fatty hilum. Limitations: none IMPRESSION: No evidence of right lower extremity DVT. Electronically Signed   By: Rolm Baptise M.D.   On: 06/10/2019 22:38   DG Chest Portable 1 View  Result Date: 06/10/2019 CLINICAL DATA:  59 year old female with fever, mouth angioedema. Started an Ace inhibitor yesterday. EXAM: PORTABLE CHEST 1 VIEW COMPARISON:  Chest radiographs 03/17/2019 and earlier. FINDINGS: Portable AP upright view at 2038 hours. Chronic tortuosity of the thoracic aorta. Stable cardiac size at the upper limits of normal. Other mediastinal contours are within normal limits. Visualized tracheal air column is within normal limits. Allowing for portable technique the lungs are clear. No pneumothorax. No acute osseous abnormality identified. IMPRESSION: No acute cardiopulmonary abnormality. Electronically Signed   By: Genevie Ann M.D.   On: 06/10/2019 20:51   ASSESSMENT AND PLAN:   [name] is a 59 y.o. female with medical history significant of hypertension who is homeless and was seen in the ER yesterday with suspected cellulitis of the right lower extremity due to spider bite.  Patient was given Keflex and lisinopril.  She returned to the hospital she has sudden angioedema with swelling of her lips and and worsening swelling of her right leg.   #1 cellulitis of the right lower extremity: This followed a spider bite.   -patient just took two doses of PO Keflex came back mainly for angioedema. I don't think this is treatment failure. Okay to continue IV cefazolin. -  Pain  management as well as elevation of the foot.  -Some lower extremity no DVT  #2 angioedema: Empiric steroids with Benadryl and Pepcid -  Swelling is slowly improving. -Difficulty swallowing. No respiratory distress.  #3 hyponatremia: Most likely due to dehydration.  Hydrate and monitor. -Improving  #4 hypokalemia:  potassium repleted  #5 hypertension: Patient has ACE inhibitor allergy.  -initiated amlodipine while in the hospital  -pressure much improved  #6 homelessness: Patient will need social worker consult.  May be able to go to the shelter after discharge  #7 chronic tobacco abuse--- an advised smoking cessation -is requesting nicotine patch  DVT prophylaxis: Lovenox Code Status: Full code Family Communication: No family at bedside Disposition Plan:   Probably to the shelter tomorrow  consults called: None   TOTAL TIME TAKING CARE OF THIS PATIENT: *30* minutes.  >50% time spent on counselling and coordination of care  Note: This dictation was prepared with Dragon dictation along with smaller phrase technology. Any transcriptional errors that result from this process are unintentional.  Fritzi Mandes M.D    [facility]   CC: Primary care physician; Patient, No Pcp PerPatient ID: [name], female   DOB: [birth year], 59 y.o.   MRN: [id number]

## 2019-06-11 NOTE — Plan of Care (Signed)
  Problem: Clinical Measurements: Goal: Ability to avoid or minimize complications of infection will improve Outcome: Progressing   Problem: Skin Integrity: Goal: Skin integrity will improve Outcome: Progressing   Problem: Education: Goal: Knowledge of General Education information will improve Description: Including pain rating scale, medication(s)/side effects and non-pharmacologic comfort measures Outcome: Progressing   Problem: Clinical Measurements: Goal: Will remain free from infection Outcome: Progressing   Problem: Activity: Goal: Risk for activity intolerance will decrease Outcome: Progressing   Problem: Pain Managment: Goal: General experience of comfort will improve Outcome: Progressing   Problem: Safety: Goal: Ability to remain free from injury will improve Outcome: Progressing   Problem: Skin Integrity: Goal: Risk for impaired skin integrity will decrease Outcome: Progressing   

## 2019-06-12 MED ORDER — PREDNISONE 20 MG PO TABS
20.0000 mg | ORAL_TABLET | Freq: Every day | ORAL | Status: DC
  Administered 2019-06-12: 20 mg via ORAL

## 2019-06-12 MED ORDER — CEPHALEXIN 500 MG PO CAPS: 500.0000 mg | ORAL_CAPSULE | Freq: Three times a day (TID) | ORAL | Status: DC

## 2019-06-12 MED ORDER — HYDRALAZINE HCL 20 MG/ML IJ SOLN
10.0000 mg | Freq: Four times a day (QID) | INTRAMUSCULAR | Status: DC | PRN
  Filled 2019-06-12 (×2): qty 0.5

## 2019-06-12 MED ORDER — HYDROCHLOROTHIAZIDE 25 MG PO TABS
25.0000 mg | ORAL_TABLET | Freq: Every day | ORAL | Status: DC
  Administered 2019-06-12: 25 mg via ORAL
  Filled 2019-06-12: qty 1

## 2019-06-12 NOTE — TOC Progression Note (Signed)
Transition of Care [facility]) - Progression Note    Patient Details  Name: [name] MRN: [id number] Date of Birth: 10/25/1960  Transition of Care [facility]) CM/SW Contact  Maud Deed, LCSW Phone Number:[phone] 06/12/2019, 10:57 AM  Clinical Narrative:  CSW provided pt with list of resources for a  PCP and places to call for medication assistance. .        Expected Discharge Plan and Services           Expected Discharge Date: 06/12/19                                     Social Determinants of Health (SDOH) Interventions    Readmission Risk Interventions No flowsheet data found.

## 2019-06-12 NOTE — Discharge Instructions (Addendum)

## 2019-06-12 NOTE — Discharge Summary (Signed)
[facility] -  at [facility]   PATIENT NAME: [name]    MR#:  [id number]  DATE OF BIRTH:  Dec 28, 1960  DATE OF ADMISSION:  06/10/2019 ADMITTING PHYSICIAN: Rometta Emery, MD  DATE OF DISCHARGE: 06/12/2019  PRIMARY CARE PHYSICIAN: Patient, No Pcp Per    ADMISSION DIAGNOSIS:  Cellulitis of right lower extremity [L03.115] Cellulitis of right lower leg [L03.115] Angioedema, initial encounter [T78.3XXA]  DISCHARGE DIAGNOSIS:  angioedema secondary to lisinopril right lower extremity cellulitis hypertension tobacco abuse SECONDARY DIAGNOSIS:   Past Medical History:  Diagnosis Date  . Hypertension     HOSPITAL COURSE:   [name] [facility] a 59 y.o.femalewith medical history significant ofhypertension who is homeless and was seen in the ER yesterday with suspected cellulitis of the right lower extremity due to spider bite. Patient was given Keflex and lisinopril. She returnedto the hospital she has sudden angioedema with swelling of her lips and and worsening swelling of her right leg.   #1 cellulitis of the right lower extremity:This followed a spider bite per pt.  -patient just took two doses of PO Keflex came back mainly for angioedema. I don't think this is treatment failure. Okay to continue IV cefazolin--change to po kelfex today. Already has prescription bottle with her - Pain management as well as elevation of the foot.  -Some lower extremity no DVT  #2 angioedema:seemed empiric steroids with Benadryl and Pepcid - approved. Patient able to swallow without any difficulty. Sats stable. -no Difficulty swallowing -No respiratory distress.  #3 hyponatremia:Most likely due to dehydration. Hydrate and monitor. -Improving sodium 131  #4 hypokalemia: potassium repleted  #5 hypertension:Patient has ACE inhibitor allergy.  -on hydrochlorothiazide -pressure much improved  #6 homelessness:Patient will need social  worker consult. Patient states she is able to go to the shelter after discharge  #7 chronic tobacco abuse--- an advised smoking cessation On nicotine patch   DVT prophylaxis:Lovenox Code Status:Full code Family Communication:No family at bedside Disposition Plan: today consults called:None  CONSULTS OBTAINED:    DRUG ALLERGIES:   Allergies  Allergen Reactions  . Lisinopril Swelling    DISCHARGE MEDICATIONS:   Allergies as of 06/12/2019      Reactions   Lisinopril Swelling      Medication List    STOP taking these medications   lisinopril 10 MG tablet Commonly known as: ZESTRIL     TAKE these medications   cephALEXin 500 MG capsule Commonly known as: KEFLEX Take 1 capsule (500 mg total) by mouth 3 (three) times daily for 10 days.   hydrochlorothiazide 25 MG tablet Commonly known as: HYDRODIURIL Take 1 tablet (25 mg total) by mouth daily.   HYDROcodone-acetaminophen 5-325 MG tablet Commonly known as: NORCO/VICODIN Take 1 tablet by mouth every 6 (six) hours as needed for moderate pain.       If you experience worsening of your admission symptoms, develop shortness of breath, life threatening emergency, suicidal or homicidal thoughts you must seek medical attention immediately by calling 911 or calling your MD immediately  if symptoms less severe.  You Must read complete instructions/literature along with all the possible adverse reactions/side effects for all the Medicines you take and that have been prescribed to you. Take any new Medicines after you have completely understood and accept all the possible adverse reactions/side effects.   Please note  You were cared for by a hospitalist during your hospital stay. If you have any questions about your discharge medications or the care you received while you  were in the hospital after you are discharged, you can call the unit and asked to speak with the hospitalist on call if the hospitalist that took care  of you is not available. Once you are discharged, your primary care physician will handle any further medical issues. Please note that NO REFILLS for any discharge medications will be authorized once you are discharged, as it is imperative that you return to your primary care physician (or establish a relationship with a primary care physician if you do not have one) for your aftercare needs so that they can reassess your need for medications and monitor your lab values. Today   SUBJECTIVE   Feeling much better. She took shower. She ate well slept well. Swelling of the lips and cheeks resolved. No difficulty swallowing. She is requesting to go home. VITAL SIGNS:  Blood pressure 129/89, pulse 68, temperature 97.8 F (36.6 C), temperature source Oral, resp. rate 18, height 5\' 7"  (1.702 m), weight 72.6 kg, SpO2 100 %.  I/O:    Intake/Output Summary (Last 24 hours) at 06/12/2019 1031 Last data filed at 06/12/2019 1014 Gross per 24 hour  Intake 220 ml  Output --  Net 220 ml    PHYSICAL EXAMINATION:  GENERAL:  59 y.o.-year-old patient lying in the bed with no acute distress.  EYES: Pupils equal, round, reactive to light and accommodation. No scleral icterus.  HEENT: Head atraumatic, normocephalic. Oropharynx and nasopharynx clear.  NECK:  Supple, no jugular venous distention. No thyroid enlargement, no tenderness.  LUNGS: Normal breath sounds bilaterally, no wheezing, rales,rhonchi or crepitation. No use of accessory muscles of respiration.  CARDIOVASCULAR: S1, S2 normal. No murmurs, rubs, or gallops.  ABDOMEN: Soft, non-tender, non-distended. Bowel sounds present. No organomegaly or mass.  EXTREMITIES: No pedal edema, cyanosis, or clubbing. Mild superficial right tibial shin cellulitis and edema improving NEUROLOGIC: Cranial nerves II through XII are intact. Muscle strength 5/5 in all extremities. Sensation intact. Gait not checked.  PSYCHIATRIC: The patient is alert and oriented x 3.  SKIN:  No obvious rash, lesion, or ulcer.   DATA REVIEW:   CBC  Recent Labs  Lab 06/11/19 0529  WBC 6.7  HGB 13.3  HCT 38.2  PLT 278    Chemistries  Recent Labs  Lab 06/11/19 0529  NA 131*  K 3.5  CL 96*  CO2 24  GLUCOSE 146*  BUN 8  CREATININE 0.70  CALCIUM 8.8*  AST 37  ALT 30  ALKPHOS 76  BILITOT 1.0    Microbiology Results   Recent Results (from the past 240 hour(s))  Blood culture (routine x 2)     Status: None (Preliminary result)   Collection Time: 06/10/19  8:32 PM   Specimen: BLOOD  Result Value Ref Range Status   Specimen Description BLOOD BLOOD RIGHT FOREARM  Final   Special Requests   Final    BOTTLES DRAWN AEROBIC AND ANAEROBIC Blood Culture adequate volume   Culture   Final    NO GROWTH 2 DAYS Performed at [facility], [address], [place], [place]    Report Status PENDING  Incomplete  Blood culture (routine x 2)     Status: None (Preliminary result)   Collection Time: 06/10/19  8:32 PM   Specimen: BLOOD  Result Value Ref Range Status   Specimen Description BLOOD BLOOD LEFT FOREARM  Final   Special Requests   Final    BOTTLES DRAWN AEROBIC AND ANAEROBIC Blood Culture results may not be optimal  due to an excessive volume of blood received in culture bottles   Culture   Final    NO GROWTH 2 DAYS Performed at [facility], [address] [place], [place] [address]    Report Status PENDING  Incomplete  SARS CORONAVIRUS 2 (TAT 6-24 HRS) Nasopharyngeal Nasopharyngeal Swab     Status: None   Collection Time: 06/10/19  9:19 PM   Specimen: Nasopharyngeal Swab  Result Value Ref Range Status   SARS Coronavirus 2 NEGATIVE NEGATIVE Final    Comment: (NOTE) SARS-CoV-2 target nucleic acids are NOT DETECTED. The SARS-CoV-2 RNA is generally detectable in upper and lower respiratory specimens during the acute phase of infection. Negative results do not preclude SARS-CoV-2 infection, do not rule out co-infections with other  pathogens, and should not be used as the sole basis for treatment or other patient management decisions. Negative results must be combined with clinical observations, patient history, and epidemiological information. The expected result is Negative. Fact Sheet for Patients: SugarRoll.be Fact Sheet for Healthcare Providers: https://www.woods-mathews.com/ This test is not yet approved or cleared by the [place] FDA and  has been authorized for detection and/or diagnosis of SARS-CoV-2 by FDA under an Emergency Use Authorization (EUA). This EUA will remain  in effect (meaning this test can be used) for the duration of the COVID-19 declaration under Section 56 4(b)(1) of the Act, 21 U.S.C. section 360bbb-3(b)(1), unless the authorization is terminated or revoked sooner. Performed at [facility], [place] [address], [place], [place] [address]     RADIOLOGY:  DG Tibia/Fibula Right  Result Date: 06/10/2019 CLINICAL DATA:  Right leg pain. EXAM: RIGHT TIBIA AND FIBULA - 2 VIEW COMPARISON:  None. FINDINGS: There is no evidence of fracture or other focal bone lesions. Soft tissues are unremarkable. IMPRESSION: Negative. Electronically Signed   By: Virgina Norfolk M.D.   On: 06/10/2019 20:51   US Venous Img Lower Unilateral Right  Result Date: 06/10/2019 CLINICAL DATA:  Right leg swelling EXAM: RIGHT LOWER EXTREMITY VENOUS DOPPLER ULTRASOUND TECHNIQUE: Gray-scale sonography with compression, as well as color and duplex ultrasound, were performed to evaluate the deep venous system(s) from the level of the common femoral vein through the popliteal and proximal calf veins. COMPARISON:  None. FINDINGS: VENOUS Normal compressibility of the common femoral, superficial femoral, and popliteal veins, as well as the visualized calf veins. Visualized portions of profunda femoral vein and great saphenous vein unremarkable. No filling defects to suggest DVT on  grayscale or color Doppler imaging. Doppler waveforms show normal direction of venous flow, normal respiratory phasicity and response to augmentation. Limited views of the contralateral common femoral vein are unremarkable. OTHER Prominent, elongated lymph node in the right groin with a short axis diameter of 8 mm and normal fatty hilum. Limitations: none IMPRESSION: No evidence of right lower extremity DVT. Electronically Signed   By: Rolm Baptise M.D.   On: 06/10/2019 22:38   DG Chest Portable 1 View  Result Date: 06/10/2019 CLINICAL DATA:  59 year old female with fever, mouth angioedema. Started an Ace inhibitor yesterday. EXAM: PORTABLE CHEST 1 VIEW COMPARISON:  Chest radiographs 03/17/2019 and earlier. FINDINGS: Portable AP upright view at 2038 hours. Chronic tortuosity of the thoracic aorta. Stable cardiac size at the upper limits of normal. Other mediastinal contours are within normal limits. Visualized tracheal air column is within normal limits. Allowing for portable technique the lungs are clear. No pneumothorax. No acute osseous abnormality identified. IMPRESSION: No acute cardiopulmonary abnormality. Electronically Signed  By: Odessa Fleming M.D.   On: 06/10/2019 20:51     CODE STATUS:     Code Status Orders  (From admission, onward)         Start     Ordered   06/11/19 0213  Full code  Continuous     06/11/19 0212        Code Status History    This patient has a current code status but no historical code status.   Advance Care Planning Activity       TOTAL TIME TAKING CARE OF THIS PATIENT: *40* minutes.    Enedina Finner M.D  [facility]    CC: Primary care physician; Patient, No Pcp Per

## 2019-06-12 NOTE — Plan of Care (Signed)
  Problem: Clinical Measurements: Goal: Ability to avoid or minimize complications of infection will improve Outcome: Progressing   Problem: Skin Integrity: Goal: Skin integrity will improve Outcome: Progressing   Problem: Education: Goal: Knowledge of General Education information will improve Description: Including pain rating scale, medication(s)/side effects and non-pharmacologic comfort measures Outcome: Progressing   Problem: Clinical Measurements: Goal: Will remain free from infection Outcome: Progressing   Problem: Activity: Goal: Risk for activity intolerance will decrease Outcome: Progressing   Problem: Pain Managment: Goal: General experience of comfort will improve Outcome: Progressing   Problem: Safety: Goal: Ability to remain free from injury will improve Outcome: Progressing   Problem: Skin Integrity: Goal: Risk for impaired skin integrity will decrease Outcome: Progressing

## 2019-06-15 LAB — CULTURE, BLOOD (ROUTINE X 2)
Culture: NO GROWTH
Culture: NO GROWTH
Special Requests: ADEQUATE

## 2019-07-20 ENCOUNTER — Telehealth: Payer: Self-pay | Admitting: Pharmacist

## 2019-07-20 NOTE — Telephone Encounter (Signed)
Patient failed to provide requested 2021 financial documentation. No additional medication assistance will be provided by [facility] without the required proof of income documentation. Patient notified by letter Debra Cheek Administrative Assistant [facility] 

## 2019-09-11 ENCOUNTER — Emergency Department: Payer: Self-pay

## 2019-09-11 ENCOUNTER — Other Ambulatory Visit: Payer: Self-pay

## 2019-09-11 ENCOUNTER — Emergency Department
Admission: EM | Admit: 2019-09-11 | Discharge: 2019-09-11 | Disposition: A | Discharge status: Other Acute Inpatient Hospital | Payer: Self-pay | Attending: Emergency Medicine | Admitting: Emergency Medicine

## 2019-09-11 DIAGNOSIS — F10929 Alcohol use, unspecified with intoxication, unspecified: Secondary | ICD-10-CM

## 2019-09-11 DIAGNOSIS — I10 Essential (primary) hypertension: Secondary | ICD-10-CM | POA: Insufficient documentation

## 2019-09-11 DIAGNOSIS — R4182 Altered mental status, unspecified: Secondary | ICD-10-CM | POA: Insufficient documentation

## 2019-09-11 DIAGNOSIS — S065X0A Traumatic subdural hemorrhage without loss of consciousness, initial encounter: Secondary | ICD-10-CM

## 2019-09-11 DIAGNOSIS — Z20822 Contact with and (suspected) exposure to covid-19: Secondary | ICD-10-CM | POA: Insufficient documentation

## 2019-09-11 DIAGNOSIS — I619 Nontraumatic intracerebral hemorrhage, unspecified: Secondary | ICD-10-CM | POA: Insufficient documentation

## 2019-09-11 DIAGNOSIS — Y907 Blood alcohol level of 200-239 mg/100 ml: Secondary | ICD-10-CM | POA: Insufficient documentation

## 2019-09-11 LAB — COMPREHENSIVE METABOLIC PANEL
ALT: 49 U/L — ABNORMAL HIGH (ref 0–44)
AST: 66 U/L — ABNORMAL HIGH (ref 15–41)
Albumin: 3.3 g/dL — ABNORMAL LOW (ref 3.5–5.0)
Alkaline Phosphatase: 93 U/L (ref 38–126)
Anion gap: 13 (ref 5–15)
BUN: 6 mg/dL (ref 6–20)
CO2: 19 mmol/L — ABNORMAL LOW (ref 22–32)
Calcium: 8.8 mg/dL — ABNORMAL LOW (ref 8.9–10.3)
Chloride: 105 mmol/L (ref 98–111)
Creatinine, Ser: 0.89 mg/dL (ref 0.44–1.00)
GFR calc Af Amer: >60 mL/min (ref >60)
GFR calc non Af Amer: >60 mL/min (ref >60)
Glucose, Bld: 86 mg/dL (ref 70–99)
Potassium: 3.5 mmol/L (ref 3.5–5.1)
Sodium: 137 mmol/L (ref 135–145)
Total Bilirubin: 0.7 mg/dL (ref 0.3–1.2)
Total Protein: 7.8 g/dL (ref 6.5–8.1)

## 2019-09-11 LAB — CBC WITH DIFFERENTIAL/PLATELET
Abs Immature Granulocytes: 0.04 10*3/uL (ref 0.00–0.07)
Basophils Absolute: 0 10*3/uL (ref 0.0–0.1)
Basophils Relative: 1 %
Eosinophils Absolute: 0.1 10*3/uL (ref 0.0–0.5)
Eosinophils Relative: 1 %
HCT: 38.3 % (ref 36.0–46.0)
Hemoglobin: 13.2 g/dL (ref 12.0–15.0)
Immature Granulocytes: 1 %
Lymphocytes Relative: 42 %
Lymphs Abs: 3 10*3/uL (ref 0.7–4.0)
MCH: 34.7 pg — ABNORMAL HIGH (ref 26.0–34.0)
MCHC: 34.5 g/dL (ref 30.0–36.0)
MCV: 100.8 fL — ABNORMAL HIGH (ref 80.0–100.0)
Monocytes Absolute: 0.6 10*3/uL (ref 0.1–1.0)
Monocytes Relative: 8 %
Neutro Abs: 3.5 10*3/uL (ref 1.7–7.7)
Neutrophils Relative %: 47 %
Platelets: 277 10*3/uL (ref 150–400)
RBC: 3.8 MIL/uL — ABNORMAL LOW (ref 3.87–5.11)
RDW: 12.7 % (ref 11.5–15.5)
WBC: 7.3 10*3/uL (ref 4.0–10.5)
nRBC: 0 % (ref 0.0–0.2)

## 2019-09-11 LAB — SARS CORONAVIRUS 2 BY RT PCR (HOSPITAL ORDER, PERFORMED IN ~~LOC~~ HOSPITAL LAB): SARS Coronavirus 2: NEGATIVE

## 2019-09-11 LAB — PROTIME-INR
INR: 1 (ref 0.8–1.2)
Prothrombin Time: 12.4 seconds (ref 11.4–15.2)

## 2019-09-11 LAB — ETHANOL: Alcohol, Ethyl (B): 205 mg/dL — ABNORMAL HIGH (ref <10)

## 2019-09-11 MED ORDER — LABETALOL HCL 5 MG/ML IV SOLN
10.0000 mg | Freq: Once | INTRAVENOUS | Status: AC
  Administered 2019-09-11: 10 mg via INTRAVENOUS
  Filled 2019-09-11: qty 4

## 2019-09-11 NOTE — ED Notes (Signed)
E signature for hospital transfer was obtained via hard copy by Barbette Hair. The E-signature was placed for medical records pick up.

## 2019-09-11 NOTE — ED Notes (Signed)
Per pt she went to bed "normal" last night and woke up "sometime this morning around six" with right sided inability to move arm and leg.

## 2019-09-11 NOTE — ED Provider Notes (Addendum)
ER Provider Note       Time seen: 8:56 PM    I have reviewed the vital signs and the nursing notes. Level V caveat: History/ROS limited by altered mental status HISTORY   Chief Complaint Cerebrovascular Accident    HPI [name] is a 59 y.o. female with a history of hypertension who presents today for weakness and difficulty moving her right arm.  Patient states she was fine when she went to bed last night and this morning she woke up being unable to move her right arm.  She is also having some trouble forming words.  She has been incontinent of urine.  Discomfort is 5 out of 10.  Past Medical History:  Diagnosis Date  . Hypertension     Past Surgical History:  Procedure Laterality Date  . BREAST BIOPSY Left 2011   CORE W/CLIP  . TUBAL LIGATION      Allergies Lisinopril  Review of Systems Constitutional: Negative for fever. Cardiovascular: Negative for chest pain. Respiratory: Negative for shortness of breath. Gastrointestinal: Negative for abdominal pain, vomiting and diarrhea. Musculoskeletal: Negative for back pain. Skin: Negative for rash. Neurological: Positive for right arm weakness, difficulty speaking  All systems negative/normal/unremarkable except as stated in the HPI  ____________________________________________   PHYSICAL EXAM:  VITAL SIGNS: Vitals:   09/11/19 1955  BP: 130/90  Pulse: 89  Resp: 17  Temp: 98.9 F (37.2 C)  SpO2: 95%    Constitutional: Alert but disoriented.  No acute distress Eyes: Conjunctivae are normal. Normal extraocular movements. ENT      Head: Normocephalic and atraumatic.      Nose: No congestion/rhinnorhea.      Mouth/Throat: Mucous membranes are moist.      Neck: No stridor. Cardiovascular: Normal rate, regular rhythm. No murmurs, rubs, or gallops. Respiratory: Normal respiratory effort without tachypnea nor retractions. Breath sounds are clear and equal bilaterally. No  wheezes/rales/rhonchi. Gastrointestinal: Soft and nontender. Normal bowel sounds Musculoskeletal: Nontender with normal range of motion in extremities. No lower extremity tenderness nor edema. Neurologic:  Normal speech and language.  There is intermittent confusion, right arm is flaccid with no strength compared to left.  Normal sensation of the right arm.  There is some right leg weakness but it appears mild compared to the left.  Cranial nerves appear to be intact Skin:  Skin is warm, dry and intact. No rash noted. Psychiatric: Patient is somewhat confused ____________________________________________  EKG: Interpreted by me.  Sinus rhythm with rate of 89 bpm, LVH, normal QT  ____________________________________________   LABS (pertinent positives/negatives)  Labs Reviewed  CBC WITH DIFFERENTIAL/PLATELET - Abnormal; Notable for the following components:      Result Value   RBC 3.80 (*)    MCV 100.8 (*)    MCH 34.7 (*)    All other components within normal limits  COMPREHENSIVE METABOLIC PANEL - Abnormal; Notable for the following components:   CO2 19 (*)    Calcium 8.8 (*)    Albumin 3.3 (*)    AST 66 (*)    ALT 49 (*)    All other components within normal limits  CBG MONITORING, ED   CRITICAL CARE Performed by: Ulice Dash   Total critical care time: 30 minutes  Critical care time was exclusive of separately billable procedures and treating other patients.  Critical care was necessary to treat or prevent imminent or life-threatening deterioration.  Critical care was time spent personally by me on the following activities: development of  treatment plan with patient and/or surrogate as well as nursing, discussions with consultants, evaluation of patient's response to treatment, examination of patient, obtaining history from patient or surrogate, ordering and performing treatments and interventions, ordering and review of laboratory studies, ordering and review of  radiographic studies, pulse oximetry and re-evaluation of patient's condition.  RADIOLOGY  Images were viewed by me CT head IMPRESSION: 1. Large predominantly crescentic collection extending across the left cerebral hemisphere measuring up to 2.4 cm in maximal thickness. Mixed internal attenuation with layering hyperdensity suggesting mixed age blood products of an acute and subacute nature. 2. Extensive mass effect with global left cerebral sulcal effacement, 11 mm of midline shift, and medial ization with borderline transtentorial herniation of the left uncus. 3. No CT evidence of acute infarction. 4. Chronic microvascular angiopathy and parenchymal volume loss. Intracranial atherosclerosis.  Critical Value/emergent results were called by telephone at the time of interpretation on 09/11/2019 at 8:50 pm to provider [facility] , who verbally acknowledged these results.   DIFFERENTIAL DIAGNOSIS  CVA, TIA, intoxication, intracranial hemorrhage  ASSESSMENT AND PLAN  Intracranial hemorrhage with mass-effect, acute alcohol intoxication   Plan: The patient had presented for weakness in the right arm. Patient's labs did not reveal any acute process other than alcohol intoxication.  CT imaging of the head revealed a large collection of blood extending across the left cerebral hemisphere measuring 2.4 cm in maximal thickness.  There is extensive mass-effect and concerns for herniation.  Amazingly she is awake and talkative at this point.  I will discuss with neurosurgery for emergent transfer.  Lenise Arena MD    Note: This note was generated in part or whole with voice recognition software. Voice recognition is usually quite accurate but there are transcription errors that can and very often do occur. I apologize for any typographical errors that were not detected and corrected.     Earleen Newport, MD 09/11/19 2106    Earleen Newport, MD 09/11/19 2214

## 2019-09-11 NOTE — ED Notes (Signed)
Oldest daughter,  Rylan Kaufmann, [phone], cell phone, [phone], updated "please call for questions and update"  Permission heard by this RN and Maralyn Sago, RN, as given by pt

## 2019-09-11 NOTE — ED Notes (Addendum)
RN -ALEX @ [facility] (who is accepting care of this patient upon arrival)  was notified of a change in the pt's NIH scale from 4 to 6 regarding her vision. Verbal explanation was provided to [facility] and documentation was placed in the chart. See NIH scale note

## 2019-09-11 NOTE — ED Triage Notes (Signed)
Patient states she was fine when she went to bed last night when she woke this morning reports unable to move her right arm.  Patient aslo having difficulty forming words.  Patient has also been incontinent of urine.

## 2019-09-11 NOTE — ED Notes (Signed)
Pt left at this time headed to [facility]

## 2019-09-11 NOTE — ED Notes (Signed)
Per Verbal from [place] in the Lab. Pt's INR and PT results are as follow:  INR: 0.96 PT: 12.4

## 2019-09-14 DIAGNOSIS — S065XAA Traumatic subdural hemorrhage with loss of consciousness status unknown, initial encounter: Secondary | ICD-10-CM | POA: Insufficient documentation

## 2019-09-15 DIAGNOSIS — F17200 Nicotine dependence, unspecified, uncomplicated: Secondary | ICD-10-CM | POA: Insufficient documentation

## 2019-09-28 ENCOUNTER — Telehealth: Payer: Self-pay | Admitting: General Practice

## 2019-09-28 NOTE — Telephone Encounter (Signed)
Individual has been contacted 3+ times regarding ED referral. No further attempts to contact individual will be made. 

## 2019-12-13 ENCOUNTER — Other Ambulatory Visit: Payer: Self-pay | Admitting: Psychiatry

## 2019-12-14 ENCOUNTER — Encounter: Payer: Self-pay | Admitting: Emergency Medicine

## 2019-12-14 ENCOUNTER — Emergency Department: Payer: Self-pay

## 2019-12-14 ENCOUNTER — Other Ambulatory Visit: Payer: Self-pay

## 2019-12-14 ENCOUNTER — Emergency Department
Admission: EM | Admit: 2019-12-14 | Discharge: 2019-12-14 | Disposition: A | Discharge status: Home / Self Care | Payer: Self-pay | Attending: Emergency Medicine | Admitting: Emergency Medicine

## 2019-12-14 DIAGNOSIS — Z79899 Other long term (current) drug therapy: Secondary | ICD-10-CM | POA: Insufficient documentation

## 2019-12-14 DIAGNOSIS — R0789 Other chest pain: Secondary | ICD-10-CM | POA: Insufficient documentation

## 2019-12-14 DIAGNOSIS — M545 Low back pain: Secondary | ICD-10-CM | POA: Insufficient documentation

## 2019-12-14 DIAGNOSIS — F17210 Nicotine dependence, cigarettes, uncomplicated: Secondary | ICD-10-CM | POA: Insufficient documentation

## 2019-12-14 DIAGNOSIS — I10 Essential (primary) hypertension: Secondary | ICD-10-CM | POA: Insufficient documentation

## 2019-12-14 LAB — BASIC METABOLIC PANEL
Anion gap: 7 (ref 5–15)
BUN: 11 mg/dL (ref 6–20)
CO2: 25 mmol/L (ref 22–32)
Calcium: 9.1 mg/dL (ref 8.9–10.3)
Chloride: 105 mmol/L (ref 98–111)
Creatinine, Ser: 0.82 mg/dL (ref 0.44–1.00)
GFR calc Af Amer: >60 mL/min (ref >60)
GFR calc non Af Amer: >60 mL/min (ref >60)
Glucose, Bld: 101 mg/dL — ABNORMAL HIGH (ref 70–99)
Potassium: 4 mmol/L (ref 3.5–5.1)
Sodium: 137 mmol/L (ref 135–145)

## 2019-12-14 LAB — TROPONIN I (HIGH SENSITIVITY)
Troponin I (High Sensitivity): 3 ng/L (ref <18)
Troponin I (High Sensitivity): 3 ng/L (ref <18)

## 2019-12-14 LAB — CBC
HCT: 38.3 % (ref 36.0–46.0)
Hemoglobin: 13.5 g/dL (ref 12.0–15.0)
MCH: 34.4 pg — ABNORMAL HIGH (ref 26.0–34.0)
MCHC: 35.2 g/dL (ref 30.0–36.0)
MCV: 97.7 fL (ref 80.0–100.0)
Platelets: 293 10*3/uL (ref 150–400)
RBC: 3.92 MIL/uL (ref 3.87–5.11)
RDW: 14.8 % (ref 11.5–15.5)
WBC: 6.9 10*3/uL (ref 4.0–10.5)
nRBC: 0 % (ref 0.0–0.2)

## 2019-12-14 MED ORDER — NAPROXEN 500 MG PO TABS
500.0000 mg | ORAL_TABLET | Freq: Two times a day (BID) | ORAL | 2 refills | Status: DC
Start: 2019-12-14 — End: 2020-02-15

## 2019-12-14 MED ORDER — HYDROCHLOROTHIAZIDE 25 MG PO TABS: 25.0000 mg | ORAL_TABLET | Freq: Every day | ORAL | 0 refills | Status: DC

## 2019-12-14 MED ORDER — IPRATROPIUM-ALBUTEROL 0.5-2.5 (3) MG/3ML IN SOLN: 3.0000 mL | Freq: Once | RESPIRATORY_TRACT | Status: DC

## 2019-12-14 NOTE — ED Notes (Signed)
Signature pad not working, pt verbalizes understanding of d.c instructions. NAD noted. Steady gait

## 2019-12-14 NOTE — ED Provider Notes (Signed)
[facility] Emergency Department Provider Note   ____________________________________________    I have reviewed the triage vital signs and the nursing notes.   HISTORY  Chief Complaint     HPI [name] is a 59 y.o. female with a history of high blood pressure who presents with complaints of bilateral low back pain which she reports has been present for several days and is worse with movement and flexion.  In addition she reports today she had a brief episode of chest discomfort this morning in her right chest which is now resolved.  She denies shortness of breath.  No pleurisy.  No fevers chills or cough.  Has not taken anything for this.  Also requests medication refill for high blood pressure  Past Medical History:  Diagnosis Date  . Hypertension     Patient Active Problem List   Diagnosis Date Noted  . Cellulitis of right lower leg 06/10/2019  . Benign essential HTN 06/10/2019  . Angioedema 06/10/2019  . Hyponatremia 06/10/2019  . Hypokalemia 06/10/2019    Past Surgical History:  Procedure Laterality Date  . BRAIN SURGERY    . BREAST BIOPSY Left 2011   CORE W/CLIP  . TUBAL LIGATION      Prior to Admission medications   Medication Sig Start Date End Date Taking? Authorizing Provider  hydrochlorothiazide (HYDRODIURIL) 25 MG tablet Take 1 tablet (25 mg total) by mouth daily. 12/14/19 01/13/20  Jene Every, MD  HYDROcodone-acetaminophen (NORCO/VICODIN) 5-325 MG tablet Take 1 tablet by mouth every 6 (six) hours as needed for moderate pain. 06/09/19   Tommi Rumps, PA-C  naproxen (NAPROSYN) 500 MG tablet Take 1 tablet (500 mg total) by mouth 2 (two) times daily with a meal. 12/14/19   Jene Every, MD     Allergies Lisinopril  No family history on file.  Social History Social History   Tobacco Use  . Smoking status: Current Every Day Smoker    Packs/day: 0.33    Types: Cigarettes  . Smokeless tobacco: Never  Used  Vaping Use  . Vaping Use: Never used  Substance Use Topics  . Alcohol use: Not Currently    Comment: 3-4 40 oz beers per week  . Drug use: No    Review of Systems  Constitutional: No fever/chills Eyes: No visual changes.  ENT: No sore throat. Cardiovascular: As above Respiratory: Denies shortness of breath. Gastrointestinal: No abdominal pain.  No nausea, no vomiting.   Genitourinary: Negative for dysuria. Musculoskeletal: As above Skin: Negative for rash. Neurological: Negative for headaches or weakness   ____________________________________________   PHYSICAL EXAM:  VITAL SIGNS: ED Triage Vitals  Enc Vitals Group     BP 12/14/19 1126 (!) 175/105     Pulse Rate 12/14/19 1126 74     Resp 12/14/19 1126 16     Temp 12/14/19 1126 98.4 F (36.9 C)     Temp Source 12/14/19 1126 Oral     SpO2 12/14/19 1126 99 %     Weight --      Height 12/14/19 1124 1.702 m (5\' 7" )     Head Circumference --      Peak Flow --      Pain Score 12/14/19 1123 8     Pain Loc --      Pain Edu? --      Excl. in GC? --     Constitutional: Alert and oriented. No acute distress. Pleasant and interactive  Mouth/Throat: Mucous membranes are  moist.   Neck:  Painless ROM Cardiovascular: Normal rate, regular rhythm. Grossly normal heart sounds.  Good peripheral circulation. Respiratory: Normal respiratory effort.  No retractions. Lungs CTAB. Gastrointestinal: Soft and nontender. No distention.  No CVA tenderness.  No bruit or pulsatile mass Genitourinary: deferred Musculoskeletal: No lower extremity tenderness nor edema.  Warm and well perfused.  Mild tenderness palpation paraspinal muscles lumbar region, no bruising or rash Neurologic:  Normal speech and language. No gross focal neurologic deficits are appreciated.  Skin:  Skin is warm, dry and intact. No rash noted. Psychiatric: Mood and affect are normal. Speech and behavior are normal.  ____________________________________________     LABS (all labs ordered are listed, but only abnormal results are displayed)  Labs Reviewed  BASIC METABOLIC PANEL - Abnormal; Notable for the following components:      Result Value   Glucose, Bld 101 (*)    All other components within normal limits  CBC - Abnormal; Notable for the following components:   MCH 34.4 (*)    All other components within normal limits  TROPONIN I (HIGH SENSITIVITY)  TROPONIN I (HIGH SENSITIVITY)   ____________________________________________  EKG  ED ECG REPORT I, Jene Every, the attending physician, personally viewed and interpreted this ECG.  Date: 12/14/2019  Rhythm: normal sinus rhythm QRS Axis: normal Intervals: normal ST/T Wave abnormalities: normal Narrative Interpretation: no evidence of acute ischemia  ____________________________________________  RADIOLOGY  Chest x-ray viewed by me, no infiltrate or effusion ____________________________________________   PROCEDURES  Procedure(s) performed: No  Procedures   Critical Care performed: No ____________________________________________   INITIAL IMPRESSION / ASSESSMENT AND PLAN / ED COURSE  Pertinent labs & imaging results that were available during my care of the patient were reviewed by me and considered in my medical decision making (see chart for details).  Patient presents with primary complaint of back pain but also chest pain as described above.  Back pain appears to be musculoskeletal based on exam, no dysuria, no fever, no abdominal pain.  Worse with flexion and movement.  Recommend NSAIDs  Chest pain as noted above, very brief episode not consistent with ACS myocarditis pericarditis or dissection, no pleurisy or shortness of breath suggest PE.  Likely musculoskeletal.  EKG is quite reassuring, troponin is unremarkable.  Chest x-ray is benign.  Appropriate for outpatient follow-up, medication refills provided.     ____________________________________________   FINAL CLINICAL IMPRESSION(S) / ED DIAGNOSES  Final diagnoses:  Atypical chest pain  Chest wall pain        Note:  This document was prepared using Dragon voice recognition software and may include unintentional dictation errors.   Jene Every, MD 12/14/19 [phone]

## 2019-12-14 NOTE — ED Triage Notes (Signed)
Pt to ED via POV c/o right sided chest and arm pain. Pt states that the pain radiates into her back. Pt reports that he pain started yesterday but she was unable to get a ride to the hospital. Pt describes that pain as a "pulling". Pt is in NAD.

## 2019-12-27 ENCOUNTER — Other Ambulatory Visit: Payer: Self-pay

## 2019-12-27 ENCOUNTER — Ambulatory Visit: Payer: Self-pay | Admitting: Pharmacy Technician

## 2019-12-27 DIAGNOSIS — Z79899 Other long term (current) drug therapy: Secondary | ICD-10-CM

## 2019-12-30 ENCOUNTER — Telehealth: Payer: Self-pay | Admitting: Pharmacy Technician

## 2019-12-30 NOTE — Telephone Encounter (Signed)
Patient provided letter of support.  Approved to receive medication assistance at [facility] until time for re-certification in [id number], and as long as eligibility criteria continues to be met.    Faxed charity care application to [facility] for patient.  [facility]

## 2019-12-30 NOTE — Progress Notes (Signed)
Met with patient completed financial assistance application for [place] due to recent hospital visit.  Patient agreed to be responsible for gathering financial information and forwarding to appropriate department in [facility].  [place].  Financial application completed via telephone.  Patient to bring in letter of support.  Then application will be faxed to [facility] along with financial documentation.    Completed [facility] application and contract.  Patient agreed to all terms of the [facility] contract.    Patient stated that she is homeless.  Is sleeping in a warehouse called [employer].  Contacted Ranae Palms at [employer].  Mr. Charissa Bash verbally verified that patient is homeless and he is letting her sleep inside a warehouse, but is not providing another other financial assistance.  Mr. Charissa Bash to provide letter of support.    Patient indicated that she cannot read very well.  Received some mail from [employer] and asked me to read it to her.  The document was requesting patient contact them about her disability appeal.  I contacted [place] for patient.  Spoke with [employer].  Patient provided [employer] Hawking with information needed to process appeal.  [place] [facility]  Provided patient with community resource material based on her particular needs.     [place] [facility]

## 2020-01-13 ENCOUNTER — Other Ambulatory Visit: Payer: Self-pay | Admitting: Physician Assistant

## 2020-01-13 ENCOUNTER — Emergency Department
Admission: EM | Admit: 2020-01-13 | Discharge: 2020-01-13 | Disposition: A | Discharge status: Home / Self Care | Payer: Self-pay | Attending: Emergency Medicine | Admitting: Emergency Medicine

## 2020-01-13 ENCOUNTER — Encounter: Payer: Self-pay | Admitting: Intensive Care

## 2020-01-13 ENCOUNTER — Emergency Department: Payer: Self-pay

## 2020-01-13 ENCOUNTER — Other Ambulatory Visit: Payer: Self-pay

## 2020-01-13 DIAGNOSIS — F17210 Nicotine dependence, cigarettes, uncomplicated: Secondary | ICD-10-CM | POA: Insufficient documentation

## 2020-01-13 DIAGNOSIS — R109 Unspecified abdominal pain: Secondary | ICD-10-CM | POA: Insufficient documentation

## 2020-01-13 DIAGNOSIS — I10 Essential (primary) hypertension: Secondary | ICD-10-CM | POA: Insufficient documentation

## 2020-01-13 DIAGNOSIS — Z79899 Other long term (current) drug therapy: Secondary | ICD-10-CM | POA: Insufficient documentation

## 2020-01-13 DIAGNOSIS — M549 Dorsalgia, unspecified: Secondary | ICD-10-CM | POA: Insufficient documentation

## 2020-01-13 LAB — URINALYSIS, COMPLETE (UACMP) WITH MICROSCOPIC
Bilirubin Urine: NEGATIVE
Glucose, UA: NEGATIVE mg/dL
Hgb urine dipstick: NEGATIVE
Ketones, ur: NEGATIVE mg/dL
Leukocytes,Ua: NEGATIVE
Nitrite: NEGATIVE
Protein, ur: NEGATIVE mg/dL
Specific Gravity, Urine: 1.011 (ref 1.005–1.030)
pH: 5 (ref 5.0–8.0)

## 2020-01-13 MED ORDER — CYCLOBENZAPRINE HCL 10 MG PO TABS: 10.0000 mg | ORAL_TABLET | Freq: Three times a day (TID) | ORAL | 0 refills | Status: DC | PRN

## 2020-01-13 MED ORDER — KETOROLAC TROMETHAMINE 60 MG/2ML IM SOLN
60.0000 mg | Freq: Once | INTRAMUSCULAR | Status: AC
  Administered 2020-01-13: 60 mg via INTRAMUSCULAR
  Filled 2020-01-13: qty 2

## 2020-01-13 MED ORDER — LIDOCAINE 5 % EX PTCH: 1.0000 | MEDICATED_PATCH | Freq: Two times a day (BID) | CUTANEOUS | 0 refills | Status: DC

## 2020-01-13 MED ORDER — LIDOCAINE 5 % EX PTCH
1.0000 | MEDICATED_PATCH | CUTANEOUS | Status: DC
  Administered 2020-01-13: 1 via TRANSDERMAL
  Filled 2020-01-13: qty 1

## 2020-01-13 MED ORDER — NAPROXEN 500 MG PO TABS
500.0000 mg | ORAL_TABLET | Freq: Two times a day (BID) | ORAL | 0 refills | Status: DC
Start: 2020-01-13 — End: 2020-02-15

## 2020-01-13 MED ORDER — CYCLOBENZAPRINE HCL 10 MG PO TABS
10.0000 mg | ORAL_TABLET | Freq: Once | ORAL | Status: AC
  Administered 2020-01-13: 10 mg via ORAL
  Filled 2020-01-13: qty 1

## 2020-01-13 MED ORDER — HYDROCHLOROTHIAZIDE 25 MG PO TABS: 25.0000 mg | ORAL_TABLET | Freq: Every day | ORAL | 0 refills | Status: DC

## 2020-01-13 NOTE — Discharge Instructions (Addendum)
Follow discharge care instruction take medication as directed. °

## 2020-01-13 NOTE — ED Provider Notes (Addendum)
[facility] Emergency Department Provider Note   ____________________________________________   First MD Initiated Contact with Patient 01/13/20 1251     (approximate)  I have reviewed the triage vital signs and the nursing notes.   HISTORY  Chief Complaint Flank Pain (right)    HPI [name] is a 59 y.o. female patient presents with right low back pain that radiates to the right flank.  Patient onset of complaint was 1 week.  Patient denies urinary symptoms.  Patient denies recent provocative incident for complaint.    Patient denies radicular component to the lower extremities.  Patient denies bladder or bowel dysfunction.  There is no cauda equina findings.  Patient rates pain as 8/10.  Patient described pain is "achy".  No palliative measures for complaint.      Past Medical History:  Diagnosis Date  . Hypertension     Patient Active Problem List   Diagnosis Date Noted  . Cellulitis of right lower leg 06/10/2019  . Benign essential HTN 06/10/2019  . Angioedema 06/10/2019  . Hyponatremia 06/10/2019  . Hypokalemia 06/10/2019    Past Surgical History:  Procedure Laterality Date  . BRAIN SURGERY    . BREAST BIOPSY Left 2011   CORE W/CLIP  . TUBAL LIGATION      Prior to Admission medications   Medication Sig Start Date End Date Taking? Authorizing Provider  cyclobenzaprine (FLEXERIL) 10 MG tablet Take 1 tablet (10 mg total) by mouth 3 (three) times daily as needed. 01/13/20   Joni Reining, PA-C  hydrochlorothiazide (HYDRODIURIL) 25 MG tablet Take 1 tablet (25 mg total) by mouth daily. 12/14/19 01/13/20  Jene Every, MD  hydrochlorothiazide (HYDRODIURIL) 25 MG tablet Take 1 tablet (25 mg total) by mouth daily. 01/13/20   Joni Reining, PA-C  HYDROcodone-acetaminophen (NORCO/VICODIN) 5-325 MG tablet Take 1 tablet by mouth every 6 (six) hours as needed for moderate pain. 06/09/19   Tommi Rumps, PA-C  lidocaine  (LIDODERM) 5 % Place 1 patch onto the skin every 12 (twelve) hours. Remove & Discard patch within 12 hours or as directed by MD 01/13/20 01/12/21  Joni Reining, PA-C  naproxen (NAPROSYN) 500 MG tablet Take 1 tablet (500 mg total) by mouth 2 (two) times daily with a meal. 12/14/19   Jene Every, MD  naproxen (NAPROSYN) 500 MG tablet Take 1 tablet (500 mg total) by mouth 2 (two) times daily with a meal. 01/13/20   Joni Reining, PA-C    Allergies Lisinopril  History reviewed. No pertinent family history.  Social History Social History   Tobacco Use  . Smoking status: Current Every Day Smoker    Packs/day: 0.33    Types: Cigarettes  . Smokeless tobacco: Never Used  Vaping Use  . Vaping Use: Never used  Substance Use Topics  . Alcohol use: Not Currently    Comment: 3-4 40 oz beers per week  . Drug use: No    Review of Systems  Constitutional: No fever/chills Eyes: No visual changes. ENT: No sore throat. Cardiovascular: Denies chest pain. Respiratory: Denies shortness of breath. Gastrointestinal: No abdominal pain.  No nausea, no vomiting.  No diarrhea.  No constipation. Genitourinary: Negative for dysuria. Musculoskeletal: Positive for back pain. Skin: Negative for rash. Neurological: Negative for headaches, focal weakness or numbness.   ____________________________________________   PHYSICAL EXAM:  VITAL SIGNS: ED Triage Vitals [01/13/20 1151]  Enc Vitals Group     BP (!) 179/100     Pulse  Rate 78     Resp 20     Temp 98.4 F (36.9 C)     Temp Source Oral     SpO2 100 %     Weight 170 lb (77.1 kg)     Height 5\' 7"  (1.702 m)     Head Circumference      Peak Flow      Pain Score 8     Pain Loc      Pain Edu?      Excl. in GC?     Constitutional: Alert and oriented. Well appearing and in no acute distress. Eyes: Conjunctivae are normal. PERRL. EOMI. Head: Atraumatic. Nose: No congestion/rhinnorhea. Mouth/Throat: Mucous membranes are moist.   Oropharynx non-erythematous. Neck: No cervical spine tenderness to palpation. Cardiovascular: Normal rate, regular rhythm. Grossly normal heart sounds.  Good peripheral circulation.  Elevated blood pressure.  Patient states she is out of her blood pressure medications. Respiratory: Normal respiratory effort.  No retractions. Lungs CTAB. Gastrointestinal: Soft and nontender. No distention. No abdominal bruits. No CVA tenderness. Genitourinary: Deferred Musculoskeletal: No obvious lumbar spine deformity.  Patient is moderate guarding palpation of L3-S1.  Patient has negative straight leg test in the supine position. Neurologic:  Normal speech and language. No gross focal neurologic deficits are appreciated. No gait instability. Skin:  Skin is warm, dry and intact. No rash noted. Psychiatric: Mood and affect are normal. Speech and behavior are normal.  ____________________________________________   LABS (all labs ordered are listed, but only abnormal results are displayed)  Labs Reviewed  URINALYSIS, COMPLETE (UACMP) WITH MICROSCOPIC - Abnormal; Notable for the following components:      Result Value   Color, Urine YELLOW (*)    APPearance CLEAR (*)    Bacteria, UA RARE (*)    All other components within normal limits   ____________________________________________  EKG   ____________________________________________  RADIOLOGY I, , personally viewed and evaluated these images (plain radiographs) as part of my medical decision making, as well as reviewing the written report by the radiologist.  ED MD interpretation:   No acute findings x-ray of the lumbar spine. Official radiology report(s): DG Lumbar Spine 2-3 Views  Result Date: 01/13/2020 CLINICAL DATA:  Back pain EXAM: LUMBAR SPINE - 2-3 VIEW COMPARISON:  None. FINDINGS: Frontal, lateral, and spot lumbosacral lateral images were obtained. There are 5 non-rib-bearing lumbar type vertebral bodies. There is no  fracture or spondylolisthesis. There is moderate disc space narrowing at L4-5. Other disc spaces appear unremarkable. No erosive change. There are foci of aortic atherosclerosis. IMPRESSION: Moderate disc space narrowing at L4-5. Other disc spaces appear unremarkable. No fracture or spondylolisthesis. Aortic Atherosclerosis (ICD10-I70.0). Electronically Signed   By: 01/15/2020 III M.D.   On: 01/13/2020 13:30    ____________________________________________   PROCEDURES  Procedure(s) performed (including Critical Care):  Procedures   ____________________________________________   INITIAL IMPRESSION / ASSESSMENT AND PLAN / ED COURSE  As part of my medical decision making, I reviewed the following data within the electronic MEDICAL RECORD NUMBER      Patient presents with low back pain with radiation to the right flank.  Patient state there is a history of chronic back pain.  Discussed no acute findings x-ray of the lumbar spine with patient.  Patient urine was negative for hematuria.  Patient complaining physical exam consistent with low back pain.  Patient given discharge care instruction advised take medication as directed.  Patient [facility]  ____________________________________________   FINAL CLINICAL IMPRESSION(S) / ED DIAGNOSES  Final diagnoses:  Musculoskeletal back pain     ED Discharge Orders         Ordered    cyclobenzaprine (FLEXERIL) 10 MG tablet  3 times daily PRN        01/13/20 1345    naproxen (NAPROSYN) 500 MG tablet  2 times daily with meals        01/13/20 1345    lidocaine (LIDODERM) 5 %  Every 12 hours        01/13/20 1345    hydrochlorothiazide (HYDRODIURIL) 25 MG tablet  Daily        01/13/20 1406          *Please note:  [name] was evaluated in Emergency Department on 01/13/2020 for the symptoms described in the history of present illness. She was evaluated in the context of  the global COVID-19 pandemic, which necessitated consideration that the patient might be at risk for infection with the SARS-CoV-2 virus that causes COVID-19. Institutional protocols and algorithms that pertain to the evaluation of patients at risk for COVID-19 are in a state of rapid change based on information released by regulatory bodies including the CDC and federal and state organizations. These policies and algorithms were followed during the patient's care in the ED.  Some ED evaluations and interventions may be delayed as a result of limited staffing during and the pandemic.*   Note:  This document was prepared using Dragon voice recognition software and may include unintentional dictation errors.    Joni Reining, PA-C 01/13/20 1351    Joni Reining, PA-C 01/13/20 1407    Sharman Cheek, MD 01/17/20 [phone]

## 2020-01-13 NOTE — ED Triage Notes (Signed)
Pt c/o right lower back/side pain X1 week. Denies urinary symptoms. Denies recent injury. Able to ambulate with no problems

## 2020-01-13 NOTE — ED Notes (Signed)
Courtesy car called by [name], pt placed in wheelchair and taken to lobby to wait for courtesy car. Pt tolerated well, states her back is "much better than it was."

## 2020-01-13 NOTE — ED Notes (Signed)
Ron, PA made aware of blood pressure, script written for bp med and this rn gave script to pt to have hctz refilled. Pt instructed to take med as soon as it's filled today, pt verbalized understanding. Denies any complaints at this time.

## 2020-02-15 ENCOUNTER — Other Ambulatory Visit: Payer: Self-pay | Admitting: Physician Assistant

## 2020-02-15 ENCOUNTER — Other Ambulatory Visit: Payer: Self-pay

## 2020-02-15 ENCOUNTER — Emergency Department
Admission: EM | Admit: 2020-02-15 | Discharge: 2020-02-15 | Disposition: A | Discharge status: Home / Self Care | Payer: Self-pay | Attending: Emergency Medicine | Admitting: Emergency Medicine

## 2020-02-15 DIAGNOSIS — Z79899 Other long term (current) drug therapy: Secondary | ICD-10-CM | POA: Insufficient documentation

## 2020-02-15 DIAGNOSIS — Z760 Encounter for issue of repeat prescription: Secondary | ICD-10-CM | POA: Insufficient documentation

## 2020-02-15 DIAGNOSIS — I10 Essential (primary) hypertension: Secondary | ICD-10-CM | POA: Insufficient documentation

## 2020-02-15 DIAGNOSIS — F17210 Nicotine dependence, cigarettes, uncomplicated: Secondary | ICD-10-CM | POA: Insufficient documentation

## 2020-02-15 MED ORDER — NAPROXEN 500 MG PO TABS
500.0000 mg | ORAL_TABLET | Freq: Once | ORAL | Status: AC
  Administered 2020-02-15: 500 mg via ORAL
  Filled 2020-02-15: qty 1

## 2020-02-15 MED ORDER — NAPROXEN 500 MG PO TABS: 500.0000 mg | ORAL_TABLET | Freq: Two times a day (BID) | ORAL | 2 refills | Status: DC

## 2020-02-15 MED ORDER — FLUOXETINE HCL 20 MG PO CAPS
40.0000 mg | ORAL_CAPSULE | Freq: Once | ORAL | Status: AC
  Administered 2020-02-15: 40 mg via ORAL
  Filled 2020-02-15: qty 2

## 2020-02-15 MED ORDER — HYDROCHLOROTHIAZIDE 25 MG PO TABS: 25.0000 mg | ORAL_TABLET | Freq: Every day | ORAL | 2 refills | Status: DC

## 2020-02-15 NOTE — ED Provider Notes (Signed)
[facility] Emergency Department Provider Note ____________________________________________  Time seen: 1501  I have reviewed the triage vital signs and the nursing notes.  HISTORY  Chief Complaint  Medication Refill  HPI [name] is a 59 y.o. female presents to the ED for request of refills of her  maintenance medicines including hydrochlorothiazide, fluoxetine, and naproxen.  Patient presents with bottles for her naproxen and fluoxetine.  She apparently has a refill pending on her fluoxetine.  As advised the patient that she should contact pharmacy to request that refill.  She does endorse that she has not taken her fluoxetine for these last 2 days.   Past Medical History:  Diagnosis Date  . Hypertension     Patient Active Problem List   Diagnosis Date Noted  . Cellulitis of right lower leg 06/10/2019  . Benign essential HTN 06/10/2019  . Angioedema 06/10/2019  . Hyponatremia 06/10/2019  . Hypokalemia 06/10/2019    Past Surgical History:  Procedure Laterality Date  . BRAIN SURGERY    . BREAST BIOPSY Left 2011   CORE W/CLIP  . TUBAL LIGATION      Prior to Admission medications   Medication Sig Start Date End Date Taking? Authorizing Provider  cyclobenzaprine (FLEXERIL) 10 MG tablet Take 1 tablet (10 mg total) by mouth 3 (three) times daily as needed. 01/13/20   Joni Reining, PA-C  hydrochlorothiazide (HYDRODIURIL) 25 MG tablet Take 1 tablet (25 mg total) by mouth daily. 12/14/19 01/13/20  Jene Every, MD  hydrochlorothiazide (HYDRODIURIL) 25 MG tablet Take 1 tablet (25 mg total) by mouth daily. 02/15/20 05/15/20  Abigial Newville, Charlesetta Ivory, PA-C  lidocaine (LIDODERM) 5 % Place 1 patch onto the skin every 12 (twelve) hours. Remove & Discard patch within 12 hours or as directed by MD 01/13/20 01/12/21  Joni Reining, PA-C  naproxen (NAPROSYN) 500 MG tablet Take 1 tablet (500 mg total) by mouth 2 (two) times daily with a meal. 02/15/20    Bobby Barton, Charlesetta Ivory, PA-C    Allergies Lisinopril  No family history on file.  Social History Social History   Tobacco Use  . Smoking status: Current Every Day Smoker    Packs/day: 0.33    Types: Cigarettes  . Smokeless tobacco: Never Used  Vaping Use  . Vaping Use: Never used  Substance Use Topics  . Alcohol use: Not Currently    Comment: 3-4 40 oz beers per week  . Drug use: No    Review of Systems  Constitutional: Negative for fever. Eyes: Negative for visual changes. ENT: Negative for sore throat. Cardiovascular: Negative for chest pain. Respiratory: Negative for shortness of breath. Gastrointestinal: Negative for abdominal pain, vomiting and diarrhea. Genitourinary: Negative for dysuria. Musculoskeletal: Negative for back pain. Skin: Negative for rash. Neurological: Negative for headaches, focal weakness or numbness. ____________________________________________  PHYSICAL EXAM:  VITAL SIGNS: ED Triage Vitals  Enc Vitals Group     BP 02/15/20 1341 (!) 176/100     Pulse Rate 02/15/20 1341 72     Resp 02/15/20 1341 17     Temp 02/15/20 1341 98.2 F (36.8 C)     Temp Source 02/15/20 1341 Oral     SpO2 02/15/20 1341 98 %     Weight 02/15/20 1342 154 lb 3.2 oz (69.9 kg)     Height 02/15/20 1342 5\' 7"  (1.702 m)     Head Circumference --      Peak Flow --      Pain Score  02/15/20 1341 7     Pain Loc --      Pain Edu? --      Excl. in GC? --     Constitutional: Alert and oriented. Well appearing and in no distress. Head: Normocephalic and atraumatic. Eyes: Conjunctivae are normal.  Normal extraocular movements Cardiovascular: Normal rate, regular rhythm. Normal distal pulses. Respiratory: Normal respiratory effort. No wheezes/rales/rhonchi. Musculoskeletal: Nontender with normal range of motion in all extremities.  Neurologic:  Normal gait without ataxia. Normal speech and language. No gross focal neurologic deficits are appreciated. Skin:  Skin is  warm, dry and intact. No rash noted. Psychiatric: Mood and affect are normal. Patient exhibits appropriate insight and judgment. ____________________________________________  PROCEDURES  Fluoxetine 40 mg PO Naproxen 500 mg PO  Procedures ____________________________________________  INITIAL IMPRESSION / ASSESSMENT AND PLAN / ED COURSE  Patient presents to the ED with a refill request.  Patient was requesting a refill on her fluoxetine as well as her naproxen.  She also requested a refill of her hydrochlorothiazide and naproxen.  She was found to have a refill standing on her fluoxetine.  I have asked patient to contact her pharmacy provider tomorrow. Refill for HCTZ and Naproxen are provided.   Beautiful Pensyl was evaluated in Emergency Department on 02/15/2020 for the symptoms described in the history of present illness. She was evaluated in the context of the global COVID-19 pandemic, which necessitated consideration that the patient might be at risk for infection with the SARS-CoV-2 virus that causes COVID-19. Institutional protocols and algorithms that pertain to the evaluation of patients at risk for COVID-19 are in a state of rapid change based on information released by regulatory bodies including the CDC and federal and state organizations. These policies and algorithms were followed during the patient's care in the ED. ____________________________________________  FINAL CLINICAL IMPRESSION(S) / ED DIAGNOSES  Final diagnoses:  Encounter for medication refill      Lissa Hoard, PA-C 02/15/20 1925    Chesley Noon, MD 02/16/20 1504

## 2020-02-15 NOTE — ED Notes (Signed)
See triage note. Pt states she need a refill on her medication because the people that prescribed the medication would not refill it because she did not have any insurance. Pt is A&Ox4 and NAD. Denies any needs at this time

## 2020-02-15 NOTE — Discharge Instructions (Addendum)
Take your home meds as prescribed. Follow-up with your primary provider for ongoing medicines as prescribed.

## 2020-02-15 NOTE — ED Triage Notes (Signed)
Pt states she is here for a medication refill of her hydrochlorothiazide and muscle relaxer for her back

## 2020-02-19 ENCOUNTER — Other Ambulatory Visit: Payer: Self-pay

## 2020-02-19 ENCOUNTER — Encounter: Payer: Self-pay | Admitting: Emergency Medicine

## 2020-02-19 ENCOUNTER — Emergency Department: Payer: Self-pay

## 2020-02-19 ENCOUNTER — Emergency Department
Admission: EM | Admit: 2020-02-19 | Discharge: 2020-02-19 | Disposition: A | Discharge status: Home / Self Care | Payer: Self-pay | Attending: Emergency Medicine | Admitting: Emergency Medicine

## 2020-02-19 DIAGNOSIS — R079 Chest pain, unspecified: Secondary | ICD-10-CM | POA: Insufficient documentation

## 2020-02-19 DIAGNOSIS — M542 Cervicalgia: Secondary | ICD-10-CM | POA: Insufficient documentation

## 2020-02-19 DIAGNOSIS — M79601 Pain in right arm: Secondary | ICD-10-CM | POA: Insufficient documentation

## 2020-02-19 DIAGNOSIS — Z5321 Procedure and treatment not carried out due to patient leaving prior to being seen by health care provider: Secondary | ICD-10-CM | POA: Insufficient documentation

## 2020-02-19 LAB — CBC
HCT: 38.2 % (ref 36.0–46.0)
Hemoglobin: 13 g/dL (ref 12.0–15.0)
MCH: 32.9 pg (ref 26.0–34.0)
MCHC: 34 g/dL (ref 30.0–36.0)
MCV: 96.7 fL (ref 80.0–100.0)
Platelets: 254 10*3/uL (ref 150–400)
RBC: 3.95 MIL/uL (ref 3.87–5.11)
RDW: 13.4 % (ref 11.5–15.5)
WBC: 6.9 10*3/uL (ref 4.0–10.5)
nRBC: 0 % (ref 0.0–0.2)

## 2020-02-19 LAB — BASIC METABOLIC PANEL
Anion gap: 10 (ref 5–15)
BUN: 14 mg/dL (ref 6–20)
CO2: 25 mmol/L (ref 22–32)
Calcium: 9 mg/dL (ref 8.9–10.3)
Chloride: 103 mmol/L (ref 98–111)
Creatinine, Ser: 0.74 mg/dL (ref 0.44–1.00)
GFR, Estimated: >60 mL/min (ref >60)
Glucose, Bld: 91 mg/dL (ref 70–99)
Potassium: 3.2 mmol/L — ABNORMAL LOW (ref 3.5–5.1)
Sodium: 138 mmol/L (ref 135–145)

## 2020-02-19 LAB — TROPONIN I (HIGH SENSITIVITY): Troponin I (High Sensitivity): 4 ng/L (ref <18)

## 2020-02-19 NOTE — ED Triage Notes (Signed)
Pt to ED via bus, states was getting off the bus and had sudden onset pain to her neck, states is unable to move her neck at this time, states is also unable to move her R arm. Pt denies known injury, states was just getting off the bus. While in the lobby pt began to call out due to pain in her R upper chest. Pt pulled back to triage and EKG performed by this RN.

## 2020-03-02 ENCOUNTER — Other Ambulatory Visit: Payer: Self-pay

## 2020-03-02 DIAGNOSIS — Z79899 Other long term (current) drug therapy: Secondary | ICD-10-CM | POA: Insufficient documentation

## 2020-03-02 DIAGNOSIS — I10 Essential (primary) hypertension: Secondary | ICD-10-CM | POA: Insufficient documentation

## 2020-03-02 DIAGNOSIS — J069 Acute upper respiratory infection, unspecified: Secondary | ICD-10-CM | POA: Insufficient documentation

## 2020-03-02 DIAGNOSIS — R0789 Other chest pain: Secondary | ICD-10-CM | POA: Insufficient documentation

## 2020-03-02 DIAGNOSIS — F17210 Nicotine dependence, cigarettes, uncomplicated: Secondary | ICD-10-CM | POA: Insufficient documentation

## 2020-03-02 LAB — TROPONIN I (HIGH SENSITIVITY): Troponin I (High Sensitivity): 6 ng/L (ref <18)

## 2020-03-02 LAB — CBC
HCT: 36.9 % (ref 36.0–46.0)
Hemoglobin: 12.7 g/dL (ref 12.0–15.0)
MCH: 33 pg (ref 26.0–34.0)
MCHC: 34.4 g/dL (ref 30.0–36.0)
MCV: 95.8 fL (ref 80.0–100.0)
Platelets: 251 10*3/uL (ref 150–400)
RBC: 3.85 MIL/uL — ABNORMAL LOW (ref 3.87–5.11)
RDW: 13.2 % (ref 11.5–15.5)
WBC: 7.9 10*3/uL (ref 4.0–10.5)
nRBC: 0 % (ref 0.0–0.2)

## 2020-03-02 LAB — BASIC METABOLIC PANEL
Anion gap: 15 (ref 5–15)
BUN: 29 mg/dL — ABNORMAL HIGH (ref 6–20)
CO2: 25 mmol/L (ref 22–32)
Calcium: 9.3 mg/dL (ref 8.9–10.3)
Chloride: 96 mmol/L — ABNORMAL LOW (ref 98–111)
Creatinine, Ser: 1.06 mg/dL — ABNORMAL HIGH (ref 0.44–1.00)
GFR, Estimated: >60 mL/min (ref >60)
Glucose, Bld: 121 mg/dL — ABNORMAL HIGH (ref 70–99)
Potassium: 2.6 mmol/L — CL (ref 3.5–5.1)
Sodium: 136 mmol/L (ref 135–145)

## 2020-03-02 NOTE — ED Triage Notes (Signed)
PT reporting headache x 2 days and feeling "swimmy headed." Pt reports she is also having chest pain x 2 days as well. cough and congestion also reported.  Hx of Migraines.

## 2020-03-02 NOTE — ED Triage Notes (Signed)
EMS brings pt in from local store for c/o dizziness 1-2 days

## 2020-03-03 ENCOUNTER — Emergency Department: Payer: Self-pay

## 2020-03-03 ENCOUNTER — Other Ambulatory Visit: Payer: Self-pay

## 2020-03-03 ENCOUNTER — Emergency Department
Admission: EM | Admit: 2020-03-03 | Discharge: 2020-03-04 | Disposition: A | Discharge status: Other Acute Inpatient Hospital | Payer: Self-pay | Attending: Student in an Organized Health Care Education/Training Program | Admitting: Student in an Organized Health Care Education/Training Program

## 2020-03-03 ENCOUNTER — Emergency Department
Admission: EM | Admit: 2020-03-03 | Discharge: 2020-03-03 | Disposition: A | Discharge status: Home / Self Care | Payer: Self-pay | Attending: Emergency Medicine | Admitting: Emergency Medicine

## 2020-03-03 DIAGNOSIS — Z20822 Contact with and (suspected) exposure to covid-19: Secondary | ICD-10-CM | POA: Insufficient documentation

## 2020-03-03 DIAGNOSIS — J069 Acute upper respiratory infection, unspecified: Secondary | ICD-10-CM

## 2020-03-03 DIAGNOSIS — Z79899 Other long term (current) drug therapy: Secondary | ICD-10-CM | POA: Insufficient documentation

## 2020-03-03 DIAGNOSIS — R4182 Altered mental status, unspecified: Secondary | ICD-10-CM | POA: Insufficient documentation

## 2020-03-03 DIAGNOSIS — R519 Headache, unspecified: Secondary | ICD-10-CM

## 2020-03-03 DIAGNOSIS — I10 Essential (primary) hypertension: Secondary | ICD-10-CM | POA: Insufficient documentation

## 2020-03-03 DIAGNOSIS — G062 Extradural and subdural abscess, unspecified: Secondary | ICD-10-CM

## 2020-03-03 DIAGNOSIS — F17210 Nicotine dependence, cigarettes, uncomplicated: Secondary | ICD-10-CM | POA: Insufficient documentation

## 2020-03-03 LAB — RESP PANEL BY RT-PCR (FLU A&B, COVID) ARPGX2
Influenza A by PCR: NEGATIVE
Influenza B by PCR: NEGATIVE
SARS Coronavirus 2 by RT PCR: NEGATIVE

## 2020-03-03 LAB — ETHANOL: Alcohol, Ethyl (B): <10 mg/dL (ref <10)

## 2020-03-03 LAB — URINALYSIS, COMPLETE (UACMP) WITH MICROSCOPIC
Bilirubin Urine: NEGATIVE
Glucose, UA: NEGATIVE mg/dL
Hgb urine dipstick: NEGATIVE
Ketones, ur: 5 mg/dL — AB
Nitrite: NEGATIVE
Protein, ur: NEGATIVE mg/dL
Specific Gravity, Urine: 1.02 (ref 1.005–1.030)
pH: 5 (ref 5.0–8.0)

## 2020-03-03 LAB — ACETAMINOPHEN LEVEL: Acetaminophen (Tylenol), Serum: <10 ug/mL — ABNORMAL LOW (ref 10–30)

## 2020-03-03 LAB — COMPREHENSIVE METABOLIC PANEL
ALT: 44 U/L (ref 0–44)
AST: 50 U/L — ABNORMAL HIGH (ref 15–41)
Albumin: 4 g/dL (ref 3.5–5.0)
Alkaline Phosphatase: 54 U/L (ref 38–126)
Anion gap: 14 (ref 5–15)
BUN: 21 mg/dL — ABNORMAL HIGH (ref 6–20)
CO2: 27 mmol/L (ref 22–32)
Calcium: 9.7 mg/dL (ref 8.9–10.3)
Chloride: 97 mmol/L — ABNORMAL LOW (ref 98–111)
Creatinine, Ser: 0.89 mg/dL (ref 0.44–1.00)
GFR, Estimated: >60 mL/min (ref >60)
Glucose, Bld: 112 mg/dL — ABNORMAL HIGH (ref 70–99)
Potassium: 2.8 mmol/L — ABNORMAL LOW (ref 3.5–5.1)
Sodium: 138 mmol/L (ref 135–145)
Total Bilirubin: 1.2 mg/dL (ref 0.3–1.2)
Total Protein: 8.1 g/dL (ref 6.5–8.1)

## 2020-03-03 LAB — CBC
HCT: 37.8 % (ref 36.0–46.0)
Hemoglobin: 12.8 g/dL (ref 12.0–15.0)
MCH: 32.4 pg (ref 26.0–34.0)
MCHC: 33.9 g/dL (ref 30.0–36.0)
MCV: 95.7 fL (ref 80.0–100.0)
Platelets: 268 10*3/uL (ref 150–400)
RBC: 3.95 MIL/uL (ref 3.87–5.11)
RDW: 12.9 % (ref 11.5–15.5)
WBC: 7.5 10*3/uL (ref 4.0–10.5)
nRBC: 0 % (ref 0.0–0.2)

## 2020-03-03 LAB — URINE DRUG SCREEN, QUALITATIVE (ARMC ONLY)
Amphetamines, Ur Screen: NOT DETECTED
Barbiturates, Ur Screen: NOT DETECTED
Benzodiazepine, Ur Scrn: NOT DETECTED
Cannabinoid 50 Ng, Ur ~~LOC~~: NOT DETECTED
Cocaine Metabolite,Ur ~~LOC~~: NOT DETECTED
MDMA (Ecstasy)Ur Screen: NOT DETECTED
Methadone Scn, Ur: NOT DETECTED
Opiate, Ur Screen: NOT DETECTED
Phencyclidine (PCP) Ur S: NOT DETECTED
Tricyclic, Ur Screen: NOT DETECTED

## 2020-03-03 LAB — TROPONIN I (HIGH SENSITIVITY)
Troponin I (High Sensitivity): 5 ng/L (ref <18)
Troponin I (High Sensitivity): 7 ng/L (ref <18)
Troponin I (High Sensitivity): 6 ng/L (ref <18)

## 2020-03-03 LAB — SALICYLATE LEVEL: Salicylate Lvl: <7 mg/dL — ABNORMAL LOW (ref 7.0–30.0)

## 2020-03-03 LAB — AMMONIA: Ammonia: 14 umol/L (ref 9–35)

## 2020-03-03 MED ORDER — POTASSIUM CHLORIDE CRYS ER 20 MEQ PO TBCR: 40.0000 meq | EXTENDED_RELEASE_TABLET | Freq: Once | ORAL | Status: DC

## 2020-03-03 MED ORDER — LORAZEPAM 1 MG PO TABS
1.0000 mg | ORAL_TABLET | Freq: Four times a day (QID) | ORAL | Status: DC | PRN
  Administered 2020-03-03: 22:00:00 1 mg via ORAL
  Filled 2020-03-03: qty 1

## 2020-03-03 MED ORDER — GADOBUTROL 1 MMOL/ML IV SOLN
6.0000 mL | Freq: Once | INTRAVENOUS | Status: AC | PRN
  Administered 2020-03-03: 23:00:00 7.5 mL via INTRAVENOUS

## 2020-03-03 NOTE — ED Notes (Signed)
Pt noted to be sleeping att, even and unlabored respirations, NAD

## 2020-03-03 NOTE — Discharge Instructions (Signed)
If your headaches persist for longer than 6 months from your subdural hematoma evacuation by neurosurgery please follow-up with your neurosurgeon for further evaluation of these headaches.

## 2020-03-03 NOTE — ED Notes (Signed)
Called lab to add on UA for urine sent earlier.

## 2020-03-03 NOTE — ED Notes (Signed)
Pt taken to MRI  

## 2020-03-03 NOTE — ED Provider Notes (Addendum)
[facility] Emergency Department Provider Note   ____________________________________________   Event Date/Time   First MD Initiated Contact with Patient 03/03/20 0845     (approximate)  I have reviewed the triage vital signs and the nursing notes.   HISTORY  Chief Complaint Headache    HPI [name] is a 59 y.o. female with a stated past medical history of hypertension and recent subdural hematoma in 09/2019 status post evacuation who presents for night as well as chest pain, cough, sore throat, and rhinorrhea over this time as well.  Patient states that she has had intermittent headaches since her subdural hematoma evacuation and states that this is not new.  Patient does state that her sore throat, cough, and runny nose have been new and worsening over the past 2 days.  Patient denies feeling febrile or chilled.  Patient currently denies any vision changes, tinnitus, difficulty speaking, facial droop, chest pain, shortness of breath, abdominal pain, nausea/vomiting/diarrhea, dysuria, or weakness/numbness/paresthesias in any extremity         Past Medical History:  Diagnosis Date  . Hypertension     Patient Active Problem List   Diagnosis Date Noted  . Cellulitis of right lower leg 06/10/2019  . Benign essential HTN 06/10/2019  . Angioedema 06/10/2019  . Hyponatremia 06/10/2019  . Hypokalemia 06/10/2019    Past Surgical History:  Procedure Laterality Date  . BRAIN SURGERY    . BREAST BIOPSY Left 2011   CORE W/CLIP  . TUBAL LIGATION      Prior to Admission medications   Medication Sig Start Date End Date Taking? Authorizing Provider  cyclobenzaprine (FLEXERIL) 10 MG tablet Take 1 tablet (10 mg total) by mouth 3 (three) times daily as needed. 01/13/20   Joni Reining, PA-C  hydrochlorothiazide (HYDRODIURIL) 25 MG tablet Take 1 tablet (25 mg total) by mouth daily. 12/14/19 01/13/20  Jene Every, MD  hydrochlorothiazide  (HYDRODIURIL) 25 MG tablet Take 1 tablet (25 mg total) by mouth daily. 02/15/20 05/15/20  Menshew, Charlesetta Ivory, PA-C  lidocaine (LIDODERM) 5 % Place 1 patch onto the skin every 12 (twelve) hours. Remove & Discard patch within 12 hours or as directed by MD 01/13/20 01/12/21  Joni Reining, PA-C  naproxen (NAPROSYN) 500 MG tablet Take 1 tablet (500 mg total) by mouth 2 (two) times daily with a meal. 02/15/20   Menshew, Charlesetta Ivory, PA-C    Allergies Lisinopril  History reviewed. No pertinent family history.  Social History Social History   Tobacco Use  . Smoking status: Current Every Day Smoker    Packs/day: 0.33    Types: Cigarettes  . Smokeless tobacco: Never Used  Vaping Use  . Vaping Use: Never used  Substance Use Topics  . Alcohol use: Not Currently    Comment: 3-4 40 oz beers per week  . Drug use: No    Review of Systems Constitutional: No fever/chills Eyes: No visual changes. ENT: Positive for sore throat. Cardiovascular: Endorses chest pain. Respiratory: Denies shortness of breath. Gastrointestinal: No abdominal pain.  No nausea, no vomiting.  No diarrhea. Genitourinary: Negative for dysuria. Musculoskeletal: Negative for acute arthralgias Skin: Negative for rash. Neurological: Positive for headaches, negative for weakness/numbness/paresthesias in any extremity Psychiatric: Negative for suicidal ideation/homicidal ideation   ____________________________________________   PHYSICAL EXAM:  VITAL SIGNS: ED Triage Vitals  Enc Vitals Group     BP 03/02/20 2224 (!) 151/99     Pulse Rate 03/02/20 2224 82  Resp 03/02/20 2224 20     Temp 03/02/20 2224 98 F (36.7 C)     Temp Source 03/02/20 2224 Oral     SpO2 03/02/20 2200 100 %     Weight 03/02/20 2232 154 lb 1.6 oz (69.9 kg)     Height 03/02/20 2232 5\' 7"  (1.702 m)     Head Circumference --      Peak Flow --      Pain Score 03/02/20 2232 7     Pain Loc --      Pain Edu? --      Excl. in GC? --     Constitutional: Alert and oriented. Well appearing and in no acute distress. Eyes: Conjunctivae are normal. PERRL. Head: Atraumatic. Nose: No congestion/rhinnorhea. Mouth/Throat: Mucous membranes are moist. Neck: No stridor Cardiovascular: Grossly normal heart sounds.  Good peripheral circulation. Respiratory: Normal respiratory effort.  No retractions. Gastrointestinal: Soft and nontender. No distention. Musculoskeletal: No obvious deformities Neurologic:  Normal speech and language. No gross focal neurologic deficits are appreciated. Skin:  Skin is warm and dry. No rash noted. Psychiatric: Mood and affect are normal. Speech and behavior are normal.  ____________________________________________   LABS (all labs ordered are listed, but only abnormal results are displayed)  Labs Reviewed  BASIC METABOLIC PANEL - Abnormal; Notable for the following components:      Result Value   Potassium 2.6 (*)    Chloride 96 (*)    Glucose, Bld 121 (*)    BUN 29 (*)    Creatinine, Ser 1.06 (*)    All other components within normal limits  CBC - Abnormal; Notable for the following components:   RBC 3.85 (*)    All other components within normal limits  TROPONIN I (HIGH SENSITIVITY)  TROPONIN I (HIGH SENSITIVITY)   ____________________________________________  EKG  ED ECG REPORT I, 2233, the attending physician, personally viewed and interpreted this ECG.  Date: 03/02/2020 EKG Time: 2242 Rate: 82 Rhythm: normal sinus rhythm QRS Axis: normal Intervals: normal ST/T Wave abnormalities: normal Narrative Interpretation: no evidence of acute ischemia   PROCEDURES  Procedure(s) performed (including Critical Care):  .1-3 Lead EKG Interpretation Performed by: [id number], MD Authorized by: Merwyn Katos, MD     Interpretation: normal     ECG rate:  76   ECG rate assessment: normal     Rhythm: sinus rhythm     Ectopy: none     Conduction: normal        ____________________________________________   INITIAL IMPRESSION / ASSESSMENT AND PLAN / ED COURSE  As part of my medical decision making, I reviewed the following data within the electronic MEDICAL RECORD NUMBER Nursing notes reviewed and incorporated, Labs reviewed, EKG interpreted, Old chart reviewed, and Notes from prior ED visits reviewed and incorporated        Presents with Headache.  No focal neurological symptoms. Neuro exam is benign. Pt is nontoxic. VSS.  Based on history and normal neurological exam I have low suspicion for intracranial tumor, intracranial bleed, meningitis, temporal arteritis, glaucoma, CO poisoning.  Most likely patient has benign headache, recommend rest, hydration, and ibuprofen.  Disposition: Discharge home with strict return precautions and instructions for prompt primary care follow up.      ____________________________________________   FINAL CLINICAL IMPRESSION(S) / ED DIAGNOSES  Final diagnoses:  Nonintractable headache, unspecified chronicity pattern, unspecified headache type  Upper respiratory tract infection, unspecified type     ED Discharge Orders  None       Note:  This document was prepared using Dragon voice recognition software and may include unintentional dictation errors.   Merwyn Katos, MD 03/03/20 2426    Merwyn Katos, MD 03/03/20 [phone]

## 2020-03-03 NOTE — ED Notes (Signed)
Clydie Braun to [employer] to [place].

## 2020-03-03 NOTE — ED Notes (Addendum)
Pt dressed out at this time with Dorian EDT-  Pt belongings include: Black and white purse- cell phone and tablet in purse Black vest White mask Blue jeans- lighter in pocket Cam[place] sneakers White and black socks Black underwear Denim jacket Blue t-shirt  Paediatric nurse

## 2020-03-03 NOTE — ED Triage Notes (Addendum)
Pt to ED via [facility] after leaving AMA this morning. Pt was at the [employer] when bystanders called due to pt talking to the wall and appearing confused. Upon arrival pt A&Ox4. Pt stating she has noticed increased confusion for a few days. VSS. CBG 157.

## 2020-03-03 NOTE — ED Notes (Signed)
Patient transported to MRI 

## 2020-03-03 NOTE — ED Notes (Signed)
This RN on phone with pt's daughter, Netty Starring to provide a ride for discharge. This RN given grandmother's number to call [phone].

## 2020-03-03 NOTE — ED Triage Notes (Signed)
Pt here with daughter. Per daughter, she was contacted today by her friend, pt was found in the [employer] parking lot walking around and talking to herself, pt seemed confused and not herself. Pt with pressured speech in triage, speaking about money and arguing.

## 2020-03-03 NOTE — ED Notes (Signed)
Pt given meal tray and ice chips. Family stating ETA until pick up

## 2020-03-03 NOTE — ED Notes (Signed)
No answer when called from lobby 

## 2020-03-03 NOTE — ED Notes (Signed)
This RN got in touch with pt's mother, Webb Laws [phone]. Family stating they will need to arrange a ride for pt. This RN given family call back number when the pt's ride is on the way. Family verbalized understanding

## 2020-03-03 NOTE — ED Notes (Addendum)
Pt back to room att, given warm blanket, pt appears to have balance problems, EDP aware

## 2020-03-03 NOTE — ED Provider Notes (Signed)
[facility] Emergency Department Provider Note    Event Date/Time   First MD Initiated Contact with Patient 03/03/20 1529     (approximate)  I have reviewed the triage vital signs and the nursing notes.   HISTORY  Chief Complaint Psychiatric Evaluation    HPI Kaydra Borgen is a 59 y.o. female below listed past medical history presents to the ER for altered mental status.  Was reportedly walking around [place] parking lot speaking to herself acting significantly abnormal.  Daughter brought her back to the hospital today for this confusion.  States that she looks just like when she had  to be brought into the hospital for "brain surgery ".  Not currently on any blood thinners.  No reported trauma.  Remote history of substance abuse.  No cough or congestion.  Patient very poor historian currently.  No history of mental health illness no previous admissions for mental health issues.   Past Medical History:  Diagnosis Date  . Hypertension    No family history on file. Past Surgical History:  Procedure Laterality Date  . BRAIN SURGERY    . BREAST BIOPSY Left 2011   CORE W/CLIP  . TUBAL LIGATION     Patient Active Problem List   Diagnosis Date Noted  . Cellulitis of right lower leg 06/10/2019  . Benign essential HTN 06/10/2019  . Angioedema 06/10/2019  . Hyponatremia 06/10/2019  . Hypokalemia 06/10/2019      Prior to Admission medications   Medication Sig Start Date End Date Taking? Authorizing Provider  cyclobenzaprine (FLEXERIL) 10 MG tablet Take 1 tablet (10 mg total) by mouth 3 (three) times daily as needed. 01/13/20   Joni Reining, PA-C  hydrochlorothiazide (HYDRODIURIL) 25 MG tablet Take 1 tablet (25 mg total) by mouth daily. 12/14/19 01/13/20  Jene Every, MD  hydrochlorothiazide (HYDRODIURIL) 25 MG tablet Take 1 tablet (25 mg total) by mouth daily. 02/15/20 05/15/20  Menshew, Charlesetta Ivory, PA-C  lidocaine (LIDODERM) 5 % Place  1 patch onto the skin every 12 (twelve) hours. Remove & Discard patch within 12 hours or as directed by MD 01/13/20 01/12/21  Joni Reining, PA-C  naproxen (NAPROSYN) 500 MG tablet Take 1 tablet (500 mg total) by mouth 2 (two) times daily with a meal. 02/15/20   Menshew, Charlesetta Ivory, PA-C    Allergies Lisinopril    Social History Social History   Tobacco Use  . Smoking status: Current Every Day Smoker    Packs/day: 0.33    Types: Cigarettes  . Smokeless tobacco: Never Used  Vaping Use  . Vaping Use: Never used  Substance Use Topics  . Alcohol use: Not Currently    Comment: 3-4 40 oz beers per week  . Drug use: No    Review of Systems Patient denies headaches, rhinorrhea, blurry vision, numbness, shortness of breath, chest pain, edema, cough, abdominal pain, nausea, vomiting, diarrhea, dysuria, fevers, rashes or hallucinations unless otherwise stated above in HPI. ____________________________________________   PHYSICAL EXAM:  VITAL SIGNS: Vitals:   03/03/20 2100 03/03/20 2200  BP: (!) 142/94 (!) 141/91  Pulse: 77 79  Resp: 18 18  Temp:    SpO2: 100% 100%    Constitutional: Alert , but disoriented, very poor historian Eyes: Conjunctivae are normal.  Head: Atraumatic. Nose: No congestion/rhinnorhea. Mouth/Throat: Mucous membranes are moist.   Neck: No stridor. Painless ROM.  Cardiovascular: Normal rate, regular rhythm. Grossly normal heart sounds.  Good peripheral circulation. Respiratory: Normal respiratory  effort.  No retractions. Lungs CTAB. Gastrointestinal: Soft and nontender. No distention. No abdominal bruits. No CVA tenderness. Genitourinary:  Musculoskeletal: No lower extremity tenderness nor edema.  No joint effusions. Neurologic:  Pressured speech, MAE spontaneously,  No facial droop Skin:  Skin is warm, dry and intact. No rash noted. Psychiatric: Mood and affect are normal. Speech and behavior are  normal.  ____________________________________________   LABS (all labs ordered are listed, but only abnormal results are displayed)  Results for orders placed or performed during the hospital encounter of 03/03/20 (from the past 24 hour(s))  Comprehensive metabolic panel     Status: Abnormal   Collection Time: 03/03/20  2:31 PM  Result Value Ref Range   Sodium 138 135 - 145 mmol/L   Potassium 2.8 (L) 3.5 - 5.1 mmol/L   Chloride 97 (L) 98 - 111 mmol/L   CO2 27 22 - 32 mmol/L   Glucose, Bld 112 (H) 70 - 99 mg/dL   BUN 21 (H) 6 - 20 mg/dL   Creatinine, Ser 0.86 0.44 - 1.00 mg/dL   Calcium 9.7 8.9 - 76.1 mg/dL   Total Protein 8.1 6.5 - 8.1 g/dL   Albumin 4.0 3.5 - 5.0 g/dL   AST 50 (H) 15 - 41 U/L   ALT 44 0 - 44 U/L   Alkaline Phosphatase 54 38 - 126 U/L   Total Bilirubin 1.2 0.3 - 1.2 mg/dL   GFR, Estimated >95 >09 mL/min   Anion gap 14 5 - 15  Ethanol     Status: None   Collection Time: 03/03/20  2:31 PM  Result Value Ref Range   Alcohol, Ethyl (B) <10 <10 mg/dL  Salicylate level     Status: Abnormal   Collection Time: 03/03/20  2:31 PM  Result Value Ref Range   Salicylate Lvl <7.0 (L) 7.0 - 30.0 mg/dL  Acetaminophen level     Status: Abnormal   Collection Time: 03/03/20  2:31 PM  Result Value Ref Range   Acetaminophen (Tylenol), Serum <10 (L) 10 - 30 ug/mL  cbc     Status: None   Collection Time: 03/03/20  2:31 PM  Result Value Ref Range   WBC 7.5 4.0 - 10.5 K/uL   RBC 3.95 3.87 - 5.11 MIL/uL   Hemoglobin 12.8 12.0 - 15.0 g/dL   HCT 32.6 71.2 - 45.8 %   MCV 95.7 80.0 - 100.0 fL   MCH 32.4 26.0 - 34.0 pg   MCHC 33.9 30.0 - 36.0 g/dL   RDW 09.9 83.3 - 82.5 %   Platelets 268 150 - 400 K/uL   nRBC 0.0 0.0 - 0.2 %  Troponin I (High Sensitivity)     Status: None   Collection Time: 03/03/20  2:31 PM  Result Value Ref Range   Troponin I (High Sensitivity) 7 <18 ng/L  Urine Drug Screen, Qualitative     Status: None   Collection Time: 03/03/20  3:44 PM  Result Value  Ref Range   Tricyclic, Ur Screen NONE DETECTED NONE DETECTED   Amphetamines, Ur Screen NONE DETECTED NONE DETECTED   MDMA (Ecstasy)Ur Screen NONE DETECTED NONE DETECTED   Cocaine Metabolite,Ur [place] NONE DETECTED NONE DETECTED   Opiate, Ur Screen NONE DETECTED NONE DETECTED   Phencyclidine (PCP) Ur S NONE DETECTED NONE DETECTED   Cannabinoid 50 Ng, Ur [place] NONE DETECTED NONE DETECTED   Barbiturates, Ur Screen NONE DETECTED NONE DETECTED   Benzodiazepine, Ur Scrn NONE DETECTED NONE DETECTED   Methadone Scn, Ur  NONE DETECTED NONE DETECTED  Urinalysis, Complete w Microscopic     Status: Abnormal   Collection Time: 03/03/20  3:44 PM  Result Value Ref Range   Color, Urine YELLOW (A) YELLOW   APPearance HAZY (A) CLEAR   Specific Gravity, Urine 1.020 1.005 - 1.030   pH 5.0 5.0 - 8.0   Glucose, UA NEGATIVE NEGATIVE mg/dL   Hgb urine dipstick NEGATIVE NEGATIVE   Bilirubin Urine NEGATIVE NEGATIVE   Ketones, ur 5 (A) NEGATIVE mg/dL   Protein, ur NEGATIVE NEGATIVE mg/dL   Nitrite NEGATIVE NEGATIVE   Leukocytes,Ua TRACE (A) NEGATIVE   RBC / HPF 0-5 0 - 5 RBC/hpf   WBC, UA 0-5 0 - 5 WBC/hpf   Bacteria, UA RARE (A) NONE SEEN   Squamous Epithelial / LPF 0-5 0 - 5   Mucus PRESENT    Hyaline Casts, UA PRESENT   Resp Panel by RT-PCR (Flu A&B, Covid) Nasopharyngeal Swab     Status: None   Collection Time: 03/03/20  4:40 PM   Specimen: Nasopharyngeal Swab; Nasopharyngeal(NP) swabs in vial transport medium  Result Value Ref Range   SARS Coronavirus 2 by RT PCR NEGATIVE NEGATIVE   Influenza A by PCR NEGATIVE NEGATIVE   Influenza B by PCR NEGATIVE NEGATIVE  Ammonia     Status: None   Collection Time: 03/03/20  7:00 PM  Result Value Ref Range   Ammonia 14 9 - 35 umol/L  Troponin I (High Sensitivity)     Status: None   Collection Time: 03/03/20  7:00 PM  Result Value Ref Range   Troponin I (High Sensitivity) 6 <18 ng/L   ____________________________________________  EKG My review and personal  interpretation at Time: 20:31   Indication: ams  Rate: 80  Rhythm: sinus Axis: normal Other: normal intervals, no  stemi ____________________________________________  RADIOLOGY  I personally reviewed all radiographic images ordered to evaluate for the above acute complaints and reviewed radiology reports and findings.  These findings were personally discussed with the patient.  Please see medical record for radiology report.  ____________________________________________   PROCEDURES  Procedure(s) performed:  Procedures    Critical Care performed: no ____________________________________________   INITIAL IMPRESSION / ASSESSMENT AND PLAN / ED COURSE  Pertinent labs & imaging results that were available during my care of the patient were reviewed by me and considered in my medical decision making (see chart for details).   DDX: Dehydration, sepsis, pna, uti, hypoglycemia, cva, drug effect, withdrawal, encephalitis   Alric RanJustine Elizabeth Rainone is a 59 y.o. who presents to the ED with addition as described above.  Patient without any psychiatric history although medication list does show fluoxetine.  She not SI she seems to simply be more altered and confused.  Given history of subdural will order CT imaging.  Will order tox work-up as well as metabolic work-up.  Clinical Course as of 03/04/20 0006  Fri Mar 03, 2020  1959 MRI with suggestion of chronic subdural but possibility of developing empyema however not meningitic.  She is not with any leukocytosis.  Will consult neurosurgery.  May simply be chronic finding but given her altered mental status [PR]  2034 Imaging reviewed by Dr. Marcell BarlowYarborough of neurosurgery does [facility] to concern for developing postop subdural empyema. I have called [facility]. [PR]  2146 Discussed case with Dr. Emmaline KluverElton [facility].  He has reviewed the images feels this is more likely chronic subdural.  Prior to  excepting  to their facility has requested we perform MRI with contrast to evaluate for any enhancement which would further suggest developing empyema.  At that point has requested as reach out to the neurosurgery team at [facility] once we have MRI with contrast. [PR]    Clinical Course User Index [PR] Willy Eddy, MD    The patient was evaluated in Emergency Department today for the symptoms described in the history of present illness. He/she was evaluated in the context of the global COVID-19 pandemic, which necessitated consideration that the patient might be at risk for infection with the SARS-CoV-2 virus that causes COVID-19. Institutional protocols and algorithms that pertain to the evaluation of patients at risk for COVID-19 are in a state of rapid change based on information released by regulatory bodies including the CDC and federal and state organizations. These policies and algorithms were followed during the patient's care in the ED.  As part of my medical decision making, I reviewed the following data within the electronic MEDICAL RECORD NUMBER Nursing notes reviewed and incorporated, Labs reviewed, notes from prior ED visits and [place] Controlled Substance Database   ____________________________________________   FINAL CLINICAL IMPRESSION(S) / ED DIAGNOSES  Final diagnoses:  Altered mental status, unspecified altered mental status type      NEW MEDICATIONS STARTED DURING THIS VISIT:  New Prescriptions   No medications on file     Note:  This document was prepared using Dragon voice recognition software and may include unintentional dictation errors.    Willy Eddy, MD 03/04/20 [phone]

## 2020-03-04 DIAGNOSIS — R410 Disorientation, unspecified: Secondary | ICD-10-CM | POA: Insufficient documentation

## 2020-03-04 NOTE — ED Notes (Signed)
Pt asking "what's going on?", pt asked for last known information regarding POC discussed, pt recalls being in [place] and

## 2020-03-04 NOTE — ED Notes (Signed)
[facility] to transport to [facility] [address]. Call [phone] opt 2 for report.

## 2020-03-04 NOTE — ED Provider Notes (Signed)
_________________________ 12:30 AM on 03/04/2020 -----------------------------------------  Accepted care of this patient from Dr. Roxan Hockey.  I have just spoken with Dr. Leory Plowman from [facility] neurosurgery who has requested patient to be transferred to [facility] for further management.  Recommend against antibiotics since patient will need an LP.  Patient remains stable.   Don Perking, [place], MD 03/04/20 [phone]

## 2020-03-04 NOTE — ED Notes (Signed)
Daughter called and updated on pt transfer

## 2020-03-05 DIAGNOSIS — Z87898 Personal history of other specified conditions: Secondary | ICD-10-CM | POA: Insufficient documentation

## 2020-04-03 ENCOUNTER — Emergency Department
Admission: EM | Admit: 2020-04-03 | Discharge: 2020-04-03 | Disposition: A | Discharge status: Home / Self Care | Payer: Self-pay | Attending: Emergency Medicine | Admitting: Emergency Medicine

## 2020-04-03 ENCOUNTER — Other Ambulatory Visit: Payer: Self-pay

## 2020-04-03 ENCOUNTER — Other Ambulatory Visit: Payer: Self-pay | Admitting: Emergency Medicine

## 2020-04-03 ENCOUNTER — Encounter: Payer: Self-pay | Admitting: Emergency Medicine

## 2020-04-03 DIAGNOSIS — Z9119 Patient's noncompliance with other medical treatment and regimen: Secondary | ICD-10-CM | POA: Insufficient documentation

## 2020-04-03 DIAGNOSIS — Z91199 Patient's noncompliance with other medical treatment and regimen due to unspecified reason: Secondary | ICD-10-CM

## 2020-04-03 DIAGNOSIS — F17210 Nicotine dependence, cigarettes, uncomplicated: Secondary | ICD-10-CM | POA: Insufficient documentation

## 2020-04-03 DIAGNOSIS — Z760 Encounter for issue of repeat prescription: Secondary | ICD-10-CM

## 2020-04-03 DIAGNOSIS — Z79899 Other long term (current) drug therapy: Secondary | ICD-10-CM | POA: Insufficient documentation

## 2020-04-03 DIAGNOSIS — I10 Essential (primary) hypertension: Secondary | ICD-10-CM | POA: Insufficient documentation

## 2020-04-03 MED ORDER — HYDROCHLOROTHIAZIDE 25 MG PO TABS: 25.0000 mg | ORAL_TABLET | Freq: Every day | ORAL | 1 refills | Status: DC

## 2020-04-03 NOTE — Discharge Instructions (Signed)
Call [facility] until you are able to find a regular doctor to help control your blood pressure.  Take your blood pressure medication every day.

## 2020-04-03 NOTE — ED Provider Notes (Signed)
[facility] Emergency Department Provider Note  ____________________________________________   Event Date/Time   First MD Initiated Contact with Patient 04/03/20 1052     (approximate)  I have reviewed the triage vital signs and the nursing notes.   HISTORY  Chief Complaint Medication Refill 191/95   T 99.6    Pulse 63   O2 100%   HPI [name] is a 60 y.o. female presents to the ED for refill of her blood pressure medication.  Patient states that she is applied for Medicaid and will be getting a doctor very shortly.  Patient normally gets her medication at medication management but they are closed today due to inclement weather.  She also has not taken her blood pressure medication in the last 2 days.  Patient denies any dizziness, shortness of breath, chest pain or visual changes.  Vital signs were taken by myself as nursing staff was tied up.  Vital signs are noted just below her chief complaint.         Past Medical History:  Diagnosis Date  . Hypertension     Patient Active Problem List   Diagnosis Date Noted  . Cellulitis of right lower leg 06/10/2019  . Benign essential HTN 06/10/2019  . Angioedema 06/10/2019  . Hyponatremia 06/10/2019  . Hypokalemia 06/10/2019    Past Surgical History:  Procedure Laterality Date  . BRAIN SURGERY    . BREAST BIOPSY Left 2011   CORE W/CLIP  . TUBAL LIGATION      Prior to Admission medications   Medication Sig Start Date End Date Taking? Authorizing Provider  cyclobenzaprine (FLEXERIL) 10 MG tablet Take 1 tablet (10 mg total) by mouth 3 (three) times daily as needed. 01/13/20   Joni Reining, PA-C  hydrochlorothiazide (HYDRODIURIL) 25 MG tablet Take 1 tablet (25 mg total) by mouth daily. 04/03/20 05/03/20  Bridget Hartshorn L, PA-C  lidocaine (LIDODERM) 5 % Place 1 patch onto the skin every 12 (twelve) hours. Remove & Discard patch within 12 hours or as directed by MD 01/13/20 01/12/21   Joni Reining, PA-C  naproxen (NAPROSYN) 500 MG tablet Take 1 tablet (500 mg total) by mouth 2 (two) times daily with a meal. 02/15/20   Menshew, Charlesetta Ivory, PA-C    Allergies Lisinopril  No family history on file.  Social History Social History   Tobacco Use  . Smoking status: Current Every Day Smoker    Packs/day: 0.33    Types: Cigarettes  . Smokeless tobacco: Never Used  Vaping Use  . Vaping Use: Never used  Substance Use Topics  . Alcohol use: Not Currently    Comment: 3-4 40 oz beers per week  . Drug use: No    Review of Systems Constitutional: No fever/chills Eyes: No visual changes. ENT: No sore throat. Cardiovascular: Denies chest pain. Respiratory: Denies shortness of breath. Gastrointestinal: No abdominal pain.  No nausea, no vomiting.  No diarrhea.   Musculoskeletal: Negative for back pain. Skin: Negative for rash. Neurological: Negative for headaches, focal weakness or numbness.   ____________________________________________   PHYSICAL EXAM:  VITAL SIGNS: ED Triage Vitals [04/03/20 1011]  Enc Vitals Group     BP      Pulse      Resp      Temp      Temp src      SpO2      Weight 154 lb 1.6 oz (69.9 kg)     Height 5\' 7"  (  1.702 m)     Head Circumference      Peak Flow      Pain Score 0     Pain Loc      Pain Edu?      Excl. in GC?     Constitutional: Alert and oriented. Well appearing and in no acute distress. Eyes: Conjunctivae are normal.  Head: Atraumatic. Neck: No stridor.   Cardiovascular: Normal rate, regular rhythm. Grossly normal heart sounds.  Good peripheral circulation. Respiratory: Normal respiratory effort.  No retractions. Lungs CTAB. Gastrointestinal: Soft and nontender. No distention.  Musculoskeletal: Moves upper and lower extremities without any difficulty.  Normal gait was noted. Neurologic:  Normal speech and language. No gross focal neurologic deficits are appreciated. No gait instability. Skin:  Skin is warm,  dry and intact. No rash noted. Psychiatric: Mood and affect are normal. Speech and behavior are normal.  ____________________________________________   LABS (all labs ordered are listed, but only abnormal results are displayed)  Labs Reviewed - No data to display  PROCEDURES  Procedure(s) performed (including Critical Care):  Procedures   ____________________________________________   INITIAL IMPRESSION / ASSESSMENT AND PLAN / ED COURSE  As part of my medical decision making, I reviewed the following data within the electronic MEDICAL RECORD NUMBER Notes from prior ED visits and [place] Controlled Substance Database  60 year old female presents to the ED for medication refill.  Patient states that she ran out of her blood pressure medication 2 days ago.  She reports that she does not have a primary care provider and has applied for Medicaid.  Blood pressure was elevated due to not taking medication.  She has an empty bottle of hydrochlorothiazide with her.  [facility] to [facility] a [facility]CP.  Medication was sent to [facility] as medication management is closed due to inclement weather. ____________________________________________   FINAL CLINICAL IMPRESSION(S) / ED DIAGNOSES  Final diagnoses:  Medication refill  Hypertension, uncontrolled  Medically noncompliant     ED Discharge Orders         Ordered    hydrochlorothiazide (HYDRODIURIL) 25 MG tablet  Daily,   Status:  Discontinued        04/03/20 1053    hydrochlorothiazide (HYDRODIURIL) 25 MG tablet  Daily        04/03/20 1106          *Please note:  [name] was evaluated in Emergency Department on 04/03/2020 for the symptoms described in the history of present illness. She was evaluated in the context of the global COVID-19 pandemic, which necessitated consideration that the patient might be at risk for infection with the SARS-CoV-2 virus that causes  COVID-19. Institutional protocols and algorithms that pertain to the evaluation of patients at risk for COVID-19 are in a state of rapid change based on information released by regulatory bodies including the CDC and federal and state organizations. These policies and algorithms were followed during the patient's care in the ED.  Some ED evaluations and interventions may be delayed as a result of limited staffing during and the pandemic.*   Note:  This document was prepared using Dragon voice recognition software and may include unintentional dictation errors.   Tommi Rumps, PA-C 04/03/20 1244    Minna Antis, MD 04/03/20 [phone]

## 2020-04-03 NOTE — ED Triage Notes (Signed)
Pt reports needs her a refil on her BP medication. Pt states no primary care MD so comes here for refils. t states has been out for 2 days

## 2020-06-29 ENCOUNTER — Other Ambulatory Visit: Payer: Self-pay | Admitting: Physician Assistant

## 2020-06-29 ENCOUNTER — Other Ambulatory Visit: Payer: Self-pay

## 2020-06-29 MED ORDER — NAPROXEN 500 MG PO TABS
ORAL_TABLET | Freq: Two times a day (BID) | ORAL | 2 refills | Status: AC
  Filled 2020-06-29: qty 30, 15d supply, fill #0
  Filled 2020-08-30: qty 28, 14d supply, fill #1
  Filled 2020-09-20: qty 28, 14d supply, fill #2
  Filled 2020-11-09: qty 4, 2d supply, fill #3

## 2020-06-29 MED ORDER — CYCLOBENZAPRINE HCL 10 MG PO TABS
ORAL_TABLET | ORAL | 0 refills | Status: DC
  Filled 2020-06-29: qty 15, 5d supply, fill #0

## 2020-06-29 MED FILL — Hydrochlorothiazide Tab 25 MG: ORAL | 30 days supply | Qty: 30 | Fill #0 | Status: AC

## 2020-06-30 ENCOUNTER — Other Ambulatory Visit: Payer: Self-pay

## 2020-08-30 ENCOUNTER — Other Ambulatory Visit: Payer: Self-pay

## 2020-08-30 MED FILL — Hydrochlorothiazide Tab 25 MG: ORAL | 14 days supply | Qty: 14 | Fill #1 | Status: AC

## 2020-08-30 MED FILL — Fluoxetine HCl Cap 40 MG: ORAL | 16 days supply | Qty: 16 | Fill #0 | Status: AC

## 2020-09-20 ENCOUNTER — Other Ambulatory Visit: Payer: Self-pay

## 2020-09-20 MED FILL — Hydrochlorothiazide Tab 25 MG: ORAL | 14 days supply | Qty: 14 | Fill #2 | Status: AC

## 2020-09-21 ENCOUNTER — Other Ambulatory Visit: Payer: Self-pay

## 2020-09-25 ENCOUNTER — Other Ambulatory Visit: Payer: Self-pay

## 2020-09-27 ENCOUNTER — Other Ambulatory Visit: Payer: Self-pay

## 2020-09-28 ENCOUNTER — Other Ambulatory Visit: Payer: Self-pay

## 2020-10-05 ENCOUNTER — Other Ambulatory Visit: Payer: Self-pay

## 2020-10-27 ENCOUNTER — Other Ambulatory Visit: Payer: Self-pay | Admitting: Emergency Medicine

## 2020-10-27 ENCOUNTER — Other Ambulatory Visit: Payer: Self-pay | Admitting: Physician Assistant

## 2020-10-27 ENCOUNTER — Other Ambulatory Visit: Payer: Self-pay

## 2020-10-30 ENCOUNTER — Other Ambulatory Visit: Payer: Self-pay

## 2020-10-30 ENCOUNTER — Telehealth: Payer: Self-pay | Admitting: Pharmacy Technician

## 2020-10-30 ENCOUNTER — Ambulatory Visit: Payer: Self-pay | Admitting: Pharmacy Technician

## 2020-10-30 DIAGNOSIS — Z79899 Other long term (current) drug therapy: Secondary | ICD-10-CM

## 2020-10-30 NOTE — Telephone Encounter (Signed)
Received updated proof of income.  Patient eligible to receive medication assistance at [facility] until time for re-certification in 2023, and as long as eligibility requirements continue to be met.  [name] J. Henry Demeritt Care Manager [facility]  

## 2020-10-30 NOTE — Progress Notes (Signed)
Received updated proof of income.  Patient eligible to receive medication assistance at [facility] until time for re-certification in 2023, and as long as eligibility requirements continue to be met.  [name] Care Manager [facility]  

## 2020-10-31 ENCOUNTER — Other Ambulatory Visit: Payer: Self-pay

## 2020-11-07 ENCOUNTER — Ambulatory Visit: Payer: Self-pay | Admitting: Adult Health

## 2020-11-09 ENCOUNTER — Ambulatory Visit: Payer: Self-pay | Admitting: Adult Health

## 2020-11-09 ENCOUNTER — Other Ambulatory Visit: Payer: Self-pay

## 2020-11-09 MED FILL — Hydrochlorothiazide Tab 25 MG: ORAL | 2 days supply | Qty: 2 | Fill #3 | Status: AC

## 2020-11-23 ENCOUNTER — Other Ambulatory Visit: Payer: Self-pay

## 2020-11-23 ENCOUNTER — Emergency Department
Admission: EM | Admit: 2020-11-23 | Discharge: 2020-11-23 | Disposition: A | Discharge status: Home / Self Care | Payer: Self-pay | Attending: Student in an Organized Health Care Education/Training Program | Admitting: Student in an Organized Health Care Education/Training Program

## 2020-11-23 DIAGNOSIS — I10 Essential (primary) hypertension: Secondary | ICD-10-CM | POA: Insufficient documentation

## 2020-11-23 DIAGNOSIS — Z760 Encounter for issue of repeat prescription: Secondary | ICD-10-CM | POA: Insufficient documentation

## 2020-11-23 DIAGNOSIS — Z79899 Other long term (current) drug therapy: Secondary | ICD-10-CM | POA: Insufficient documentation

## 2020-11-23 DIAGNOSIS — F17210 Nicotine dependence, cigarettes, uncomplicated: Secondary | ICD-10-CM | POA: Insufficient documentation

## 2020-11-23 MED ORDER — FLUOXETINE HCL 40 MG PO CAPS
40.0000 mg | ORAL_CAPSULE | Freq: Every morning | ORAL | 2 refills | Status: DC
  Filled 2020-11-23: qty 30, 30d supply, fill #0
  Filled 2021-01-17: qty 30, 30d supply, fill #1

## 2020-11-23 MED ORDER — HYDROCHLOROTHIAZIDE 25 MG PO TABS
25.0000 mg | ORAL_TABLET | Freq: Every day | ORAL | 3 refills | Status: DC
  Filled 2020-11-23: qty 90, 90d supply, fill #0

## 2020-11-23 MED ORDER — CYCLOBENZAPRINE HCL 10 MG PO TABS
10.0000 mg | ORAL_TABLET | Freq: Three times a day (TID) | ORAL | 0 refills | Status: AC | PRN
  Filled 2020-11-23: qty 8, 3d supply, fill #0

## 2020-11-23 NOTE — ED Provider Notes (Signed)
[facility] Emergency Department Provider Note    Event Date/Time   First MD Initiated Contact with Patient 11/23/20 1119     (approximate)  I have reviewed the triage vital signs and the nursing notes.   HISTORY  Chief Complaint Hypertension and Medication Refill    HPI [name] is a 60 y.o. female who presents to the ER for evaluation of high blood pressure and needing medication refill sent to the medication management pharmacy.  States that she is been on HCTZ for many years with well-controlled blood pressure as well as Prozac will take Flexeril from time to time for discomfort.  She denies any pain or discomfort at this time.  She will went to plasma donation center who said that she needed to be checked out with her blood pressure prior to donating so she came to the ER to help get assistance with refill of these chronic medications.  No other acute complaints  Past Medical History:  Diagnosis Date   Hypertension    No family history on file. Past Surgical History:  Procedure Laterality Date   BRAIN SURGERY     BREAST BIOPSY Left 2011   CORE W/CLIP   TUBAL LIGATION     Patient Active Problem List   Diagnosis Date Noted   Cellulitis of right lower leg 06/10/2019   Benign essential HTN 06/10/2019   Angioedema 06/10/2019   Hyponatremia 06/10/2019   Hypokalemia 06/10/2019      Prior to Admission medications   Medication Sig Start Date End Date Taking? Authorizing Provider  cyclobenzaprine (FLEXERIL) 10 MG tablet Take 1 tablet (10 mg total) by mouth 3 (three) times daily as needed for muscle spasms. 11/23/20 11/23/21  Willy Eddy, MD  FLUoxetine (PROZAC) 40 MG capsule Take 1 capsule (40 mg total) by mouth every morning. 11/23/20 11/23/21  Willy Eddy, MD  hydrochlorothiazide (HYDRODIURIL) 25 MG tablet Take 1 tablet (25 mg total) by mouth daily. 11/23/20 11/23/21  Willy Eddy, MD  lidocaine (LIDODERM) 5 % APPLY PATCH  DAILY AS DIRECTED AND LEAVE ON FOR ONLY 12 HOURS THEN REMOVE. 01/13/20 01/12/21  Joni Reining, PA-C  naproxen (NAPROSYN) 500 MG tablet TAKE ONE TABLET BY MOUTH 2 TIMES A DAY WITH A MEAL 06/29/20 06/29/21  Menshew, Charlesetta Ivory, PA-C    Allergies Lisinopril    Social History Social History   Tobacco Use   Smoking status: Every Day    Packs/day: 0.33    Types: Cigarettes   Smokeless tobacco: Never  Vaping Use   Vaping Use: Never used  Substance Use Topics   Alcohol use: Not Currently    Comment: 3-4 40 oz beers per week   Drug use: No    Review of Systems Patient denies headaches, rhinorrhea, blurry vision, numbness, shortness of breath, chest pain, edema, cough, abdominal pain, nausea, vomiting, diarrhea, dysuria, fevers, rashes or hallucinations unless otherwise stated above in HPI. ____________________________________________   PHYSICAL EXAM:  VITAL SIGNS: Vitals:   11/23/20 1031  BP: (!) 175/111  Pulse: 75  Resp: 16  Temp: 98.5 F (36.9 C)  SpO2: 98%    Constitutional: Alert and oriented. Well appearing and in no acute distress. Eyes: Conjunctivae are normal.  Head: Atraumatic. Nose: No congestion/rhinnorhea. Mouth/Throat: Mucous membranes are moist.   Neck: Painless ROM.  Cardiovascular:   Good peripheral circulation. Respiratory: Normal respiratory effort.  No retractions.  Gastrointestinal: Soft and nontender.  Musculoskeletal: No lower extremity tenderness .  No joint effusions. Neurologic:  Normal speech and language. No gross focal neurologic deficits are appreciated.  Skin:  Skin is warm, dry and intact. No rash noted. Psychiatric: Mood and affect are normal. Speech and behavior are normal.  ____________________________________________   LABS (all labs ordered are listed, but only abnormal results are displayed)  No results found for this or any previous visit (from the past 24  hour(s)). ____________________________________________ ____________________________________________  RADIOLOGY   ____________________________________________   PROCEDURES  Procedure(s) performed:  Procedures    Critical Care performed: no ____________________________________________   INITIAL IMPRESSION / ASSESSMENT AND PLAN / ED COURSE  Pertinent labs & imaging results that were available during my care of the patient were reviewed by me and considered in my medical decision making (see chart for details).   DDX: htn, noncompliance, well check  [name] is a 60 y.o. who presents to the ED with presentation as described above requiring refill of her chronic medications.  No acute complaints.  Refill sent to the pharmacy.  Have discussed with the patient and available family all diagnostics and treatments performed thus far and all questions were answered to the best of my ability. The patient demonstrates understanding and agreement with plan.     The patient was evaluated in Emergency Department today for the symptoms described in the history of present illness. He/she was evaluated in the context of the global COVID-19 pandemic, which necessitated consideration that the patient might be at risk for infection with the SARS-CoV-2 virus that causes COVID-19. Institutional protocols and algorithms that pertain to the evaluation of patients at risk for COVID-19 are in a state of rapid change based on information released by regulatory bodies including the CDC and federal and state organizations. These policies and algorithms were followed during the patient's care in the ED.   ____________________________________________   FINAL CLINICAL IMPRESSION(S) / ED DIAGNOSES  Final diagnoses:  Hypertension, unspecified type  Medication refill      NEW MEDICATIONS STARTED DURING THIS VISIT:  Current Discharge Medication List       Note:  This document was  prepared using Dragon voice recognition software and may include unintentional dictation errors.     Willy Eddy, MD 11/23/20 1147

## 2020-11-23 NOTE — ED Triage Notes (Signed)
Pt states that she was sent over from the plasma donation place to have her bp checked, pt states that she has been out of her bp med for a week and needs a prescription to take across the street to get it filled

## 2020-12-02 IMAGING — US US EXTREM LOW VENOUS*R*
1 series · 14 of 24 positions shown · non-contrast
Comparison: None.

CLINICAL DATA: Right leg swelling

EXAM:
RIGHT LOWER EXTREMITY VENOUS DOPPLER ULTRASOUND
TECHNIQUE: Gray-scale sonography with compression, as well as color and duplex
ultrasound, were performed to evaluate the deep venous system(s)
from the level of the common femoral vein through the popliteal and
proximal calf veins.

[Series 1: us venous img lower uni right (dvt) · portal-venous · 14 of 36 slices shown]
[im 1/36]
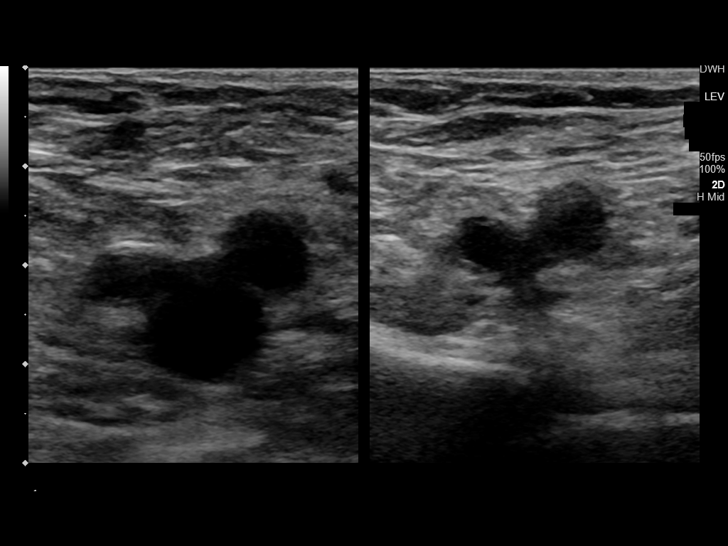
[im 4/36]
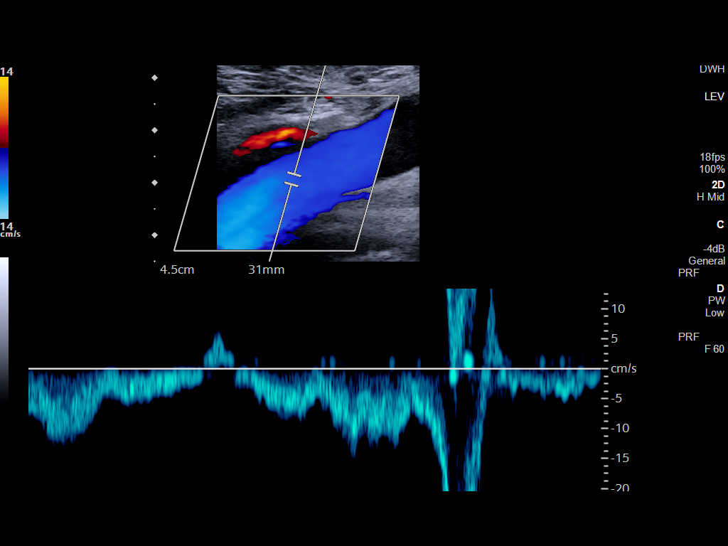
[im 7/36]
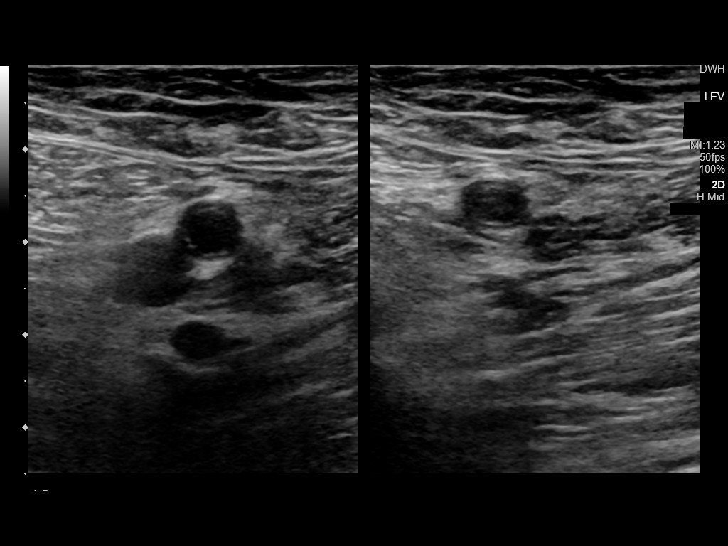
[im 10/36]
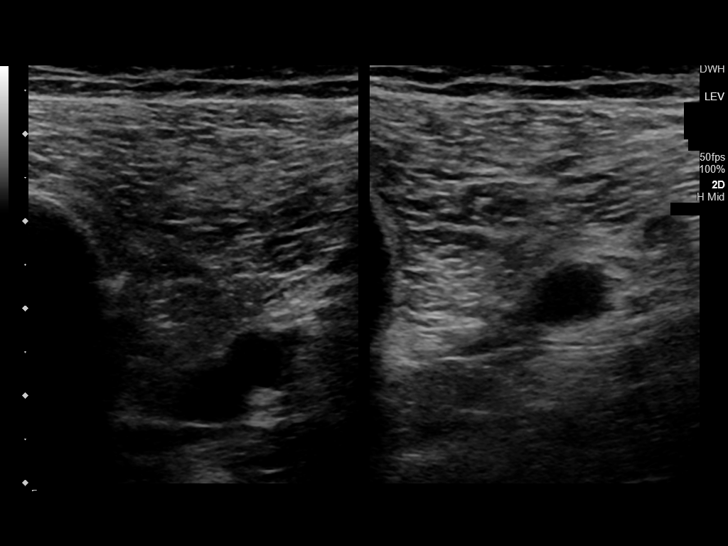
[im 11/36]
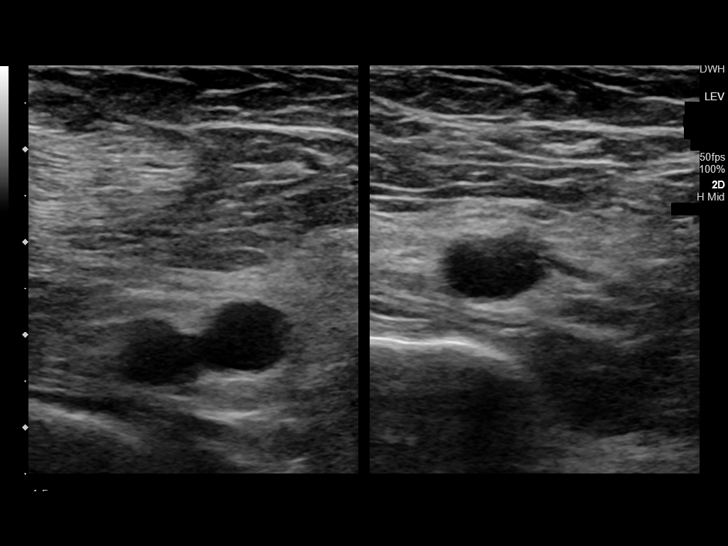
[im 14/36]
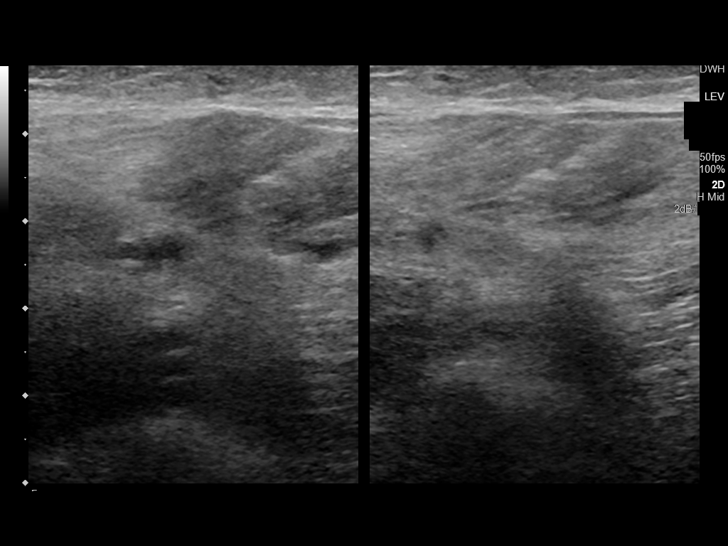
[im 17/36]
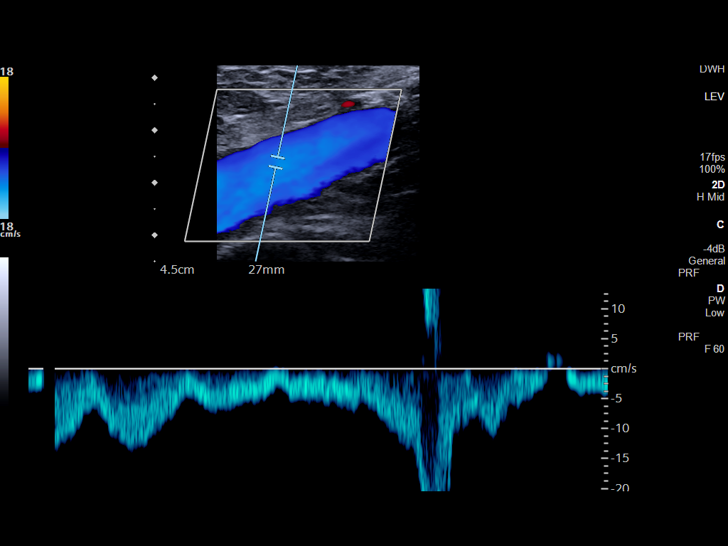
[im 19/36]
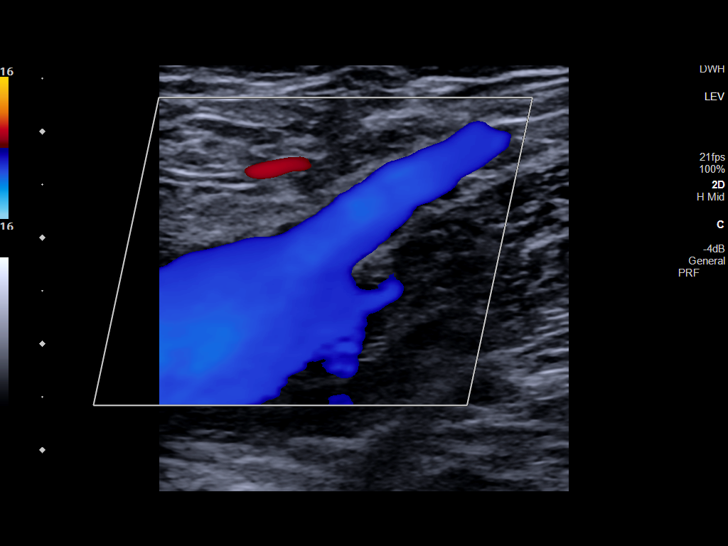
[im 22/36]
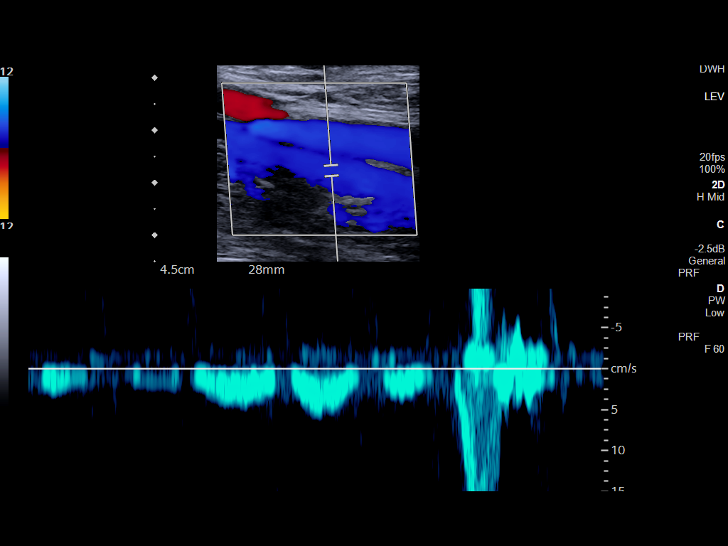
[im 25/36]
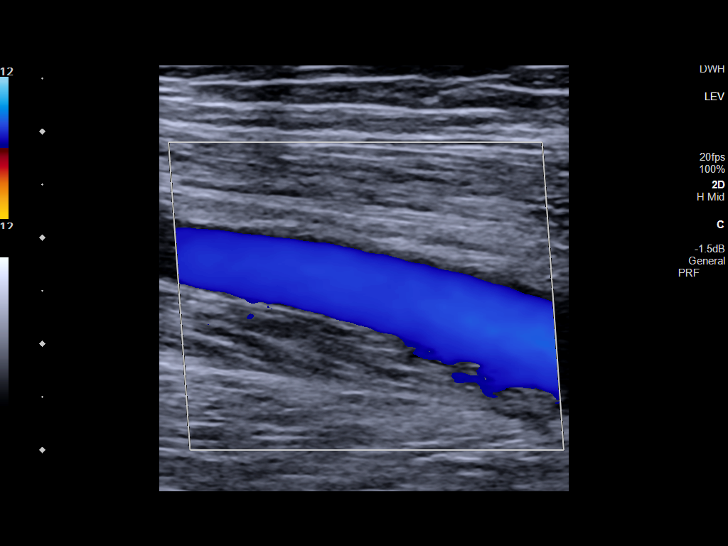
[im 28/36]
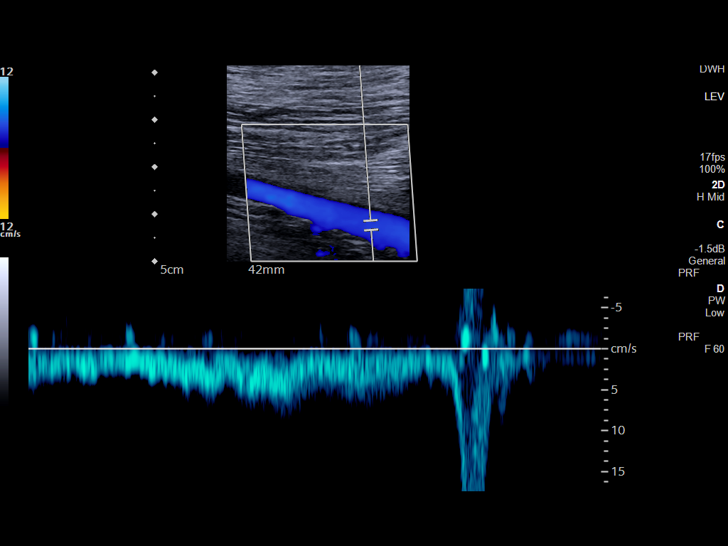
[im 29/36]
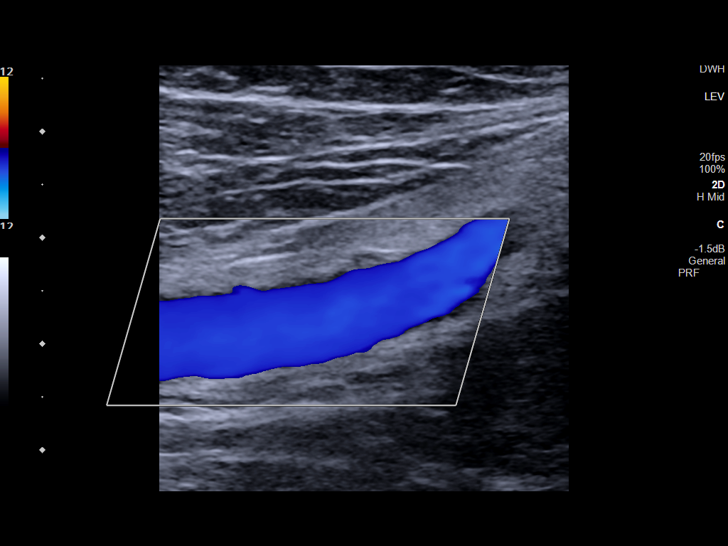
[im 32/36]
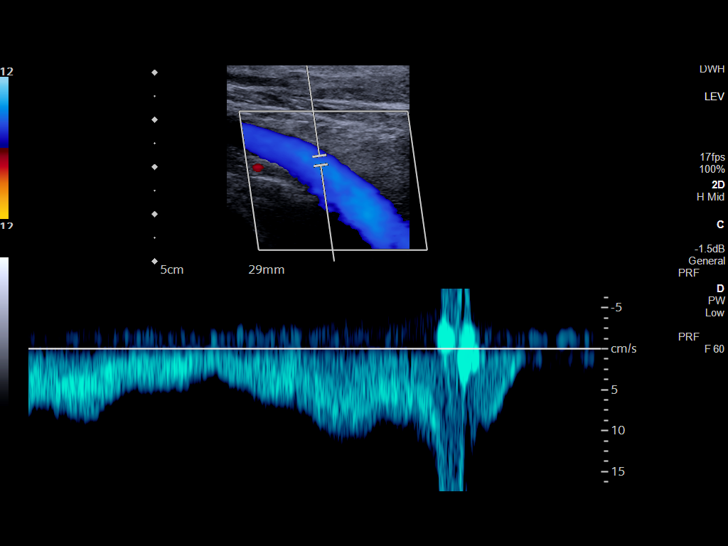
[im 36/36]
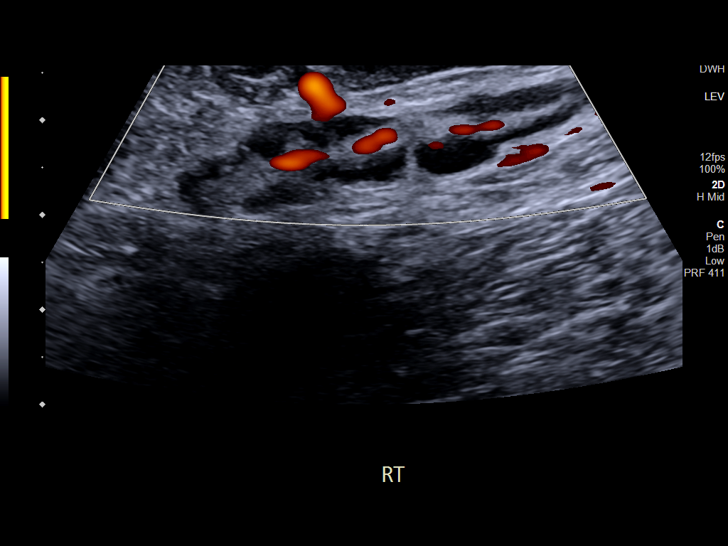

[14 of 24 positions shown; findings below may reference images not displayed]

FINDINGS: VENOUS

Normal compressibility of the common femoral, superficial femoral,
and popliteal veins, as well as the visualized calf veins.
Visualized portions of profunda femoral vein and great saphenous
vein unremarkable. No filling defects to suggest DVT on grayscale or
color Doppler imaging. Doppler waveforms show normal direction of
venous flow, normal respiratory phasicity and response to
augmentation.

Limited views of the contralateral common femoral vein are
unremarkable.

OTHER

Prominent, elongated lymph node in the right groin with a short axis
diameter of 8 mm and normal fatty hilum.

Limitations: none
IMPRESSION: No evidence of right lower extremity DVT.

## 2020-12-19 ENCOUNTER — Ambulatory Visit: Payer: Self-pay

## 2020-12-19 ENCOUNTER — Other Ambulatory Visit: Payer: Self-pay

## 2020-12-19 ENCOUNTER — Telehealth: Payer: Self-pay | Admitting: Pharmacy Technician

## 2020-12-19 NOTE — Progress Notes (Signed)
  [facility] Visit Note  Patient: [name] MRN: [id number] Date of Birth: 06/07/1960 PCP: [facility]   [place] 60 y.o. female attempted to reach via telephone to conduct MTM visit. No answer. Left voicemail. Will attempt to complete MTM at patient's next visit to [facility].   There were no vitals taken for this visit.  Patient Information   Past Medical History:  Diagnosis Date   Hypertension       Past Surgical History:  Procedure Laterality Date   BRAIN SURGERY     BREAST BIOPSY Left 2011   CORE W/CLIP   TUBAL LIGATION      No family history on file.            Social History   Substance and Sexual Activity  Alcohol Use Not Currently   Comment: 3-4 40 oz beers per week      Social History   Tobacco Use  Smoking Status Every Day   Packs/day: 0.33   Types: Cigarettes  Smokeless Tobacco Never      Health Maintenance  Topic Date Due   COVID-19 Vaccine (1) Never done   Hepatitis C Screening  Never done   TETANUS/TDAP  Never done   Zoster Vaccines- Shingrix (1 of 2) Never done   PAP SMEAR-Modifier  Never done   COLONOSCOPY (Pts 45-47yrs Insurance coverage will need to be confirmed)  Never done   MAMMOGRAM  08/22/2017   INFLUENZA VACCINE  Never done   HIV Screening  Completed   HPV VACCINES  Aged Out     Assessment and Plan:

## 2020-12-19 NOTE — Telephone Encounter (Signed)
Made referral for patient to [facility].  Sherilyn Dacosta Care Manager [facility]

## 2021-01-17 ENCOUNTER — Other Ambulatory Visit: Payer: Self-pay

## 2021-01-18 ENCOUNTER — Telehealth: Payer: Self-pay | Admitting: Emergency Medicine

## 2021-01-18 ENCOUNTER — Other Ambulatory Visit: Payer: Self-pay

## 2021-01-18 NOTE — Telephone Encounter (Signed)
Called patient to schedule a new patient appointment with [facility]. Patient is already a patient at [facility] and should not need additional paper work for [facility] per Murtis Sink. Left patient a message to call and schedule appointment.

## 2021-01-31 ENCOUNTER — Ambulatory Visit: Payer: Self-pay | Admitting: Adult Health

## 2021-02-12 ENCOUNTER — Emergency Department
Admission: EM | Admit: 2021-02-12 | Discharge: 2021-02-12 | Disposition: A | Discharge status: Home / Self Care | Payer: Self-pay | Attending: Emergency Medicine | Admitting: Emergency Medicine

## 2021-02-12 ENCOUNTER — Other Ambulatory Visit: Payer: Self-pay

## 2021-02-12 DIAGNOSIS — Z79899 Other long term (current) drug therapy: Secondary | ICD-10-CM | POA: Insufficient documentation

## 2021-02-12 DIAGNOSIS — F17210 Nicotine dependence, cigarettes, uncomplicated: Secondary | ICD-10-CM | POA: Insufficient documentation

## 2021-02-12 DIAGNOSIS — H9201 Otalgia, right ear: Secondary | ICD-10-CM | POA: Insufficient documentation

## 2021-02-12 DIAGNOSIS — I10 Essential (primary) hypertension: Secondary | ICD-10-CM | POA: Insufficient documentation

## 2021-02-12 DIAGNOSIS — Z20822 Contact with and (suspected) exposure to covid-19: Secondary | ICD-10-CM | POA: Insufficient documentation

## 2021-02-12 DIAGNOSIS — M5431 Sciatica, right side: Secondary | ICD-10-CM

## 2021-02-12 DIAGNOSIS — M5441 Lumbago with sciatica, right side: Secondary | ICD-10-CM | POA: Insufficient documentation

## 2021-02-12 LAB — RESP PANEL BY RT-PCR (FLU A&B, COVID) ARPGX2
Influenza A by PCR: NEGATIVE
Influenza B by PCR: NEGATIVE
SARS Coronavirus 2 by RT PCR: NEGATIVE

## 2021-02-12 MED ORDER — LIDOCAINE 5 % EX PTCH
1.0000 | MEDICATED_PATCH | Freq: Two times a day (BID) | CUTANEOUS | 0 refills | Status: DC
  Filled 2021-02-12: qty 10, 10d supply, fill #0

## 2021-02-12 MED ORDER — HYDROCHLOROTHIAZIDE 25 MG PO TABS
25.0000 mg | ORAL_TABLET | Freq: Every day | ORAL | 0 refills | Status: DC
  Filled 2021-02-12: qty 25, 25d supply, fill #0

## 2021-02-12 NOTE — ED Triage Notes (Signed)
Pt comes with c/o right ear pain, sore throat and right side back pain. Pt states this started few days ago.

## 2021-02-12 NOTE — ED Notes (Signed)
Pt to ED ambulatory to room husband at bedside, c/o R sided ear pain that she also feels on R neck and to R back. Denies dizziness, vision changes, NVD. Pt in NAD. States pain in ear is 8/10 since 3 days ago.

## 2021-02-12 NOTE — ED Provider Notes (Signed)
[facility] Emergency Department Provider Note ____________________________________________   Event Date/Time   First MD Initiated Contact with Patient 02/12/21 1238     (approximate)  I have reviewed the triage vital signs and the nursing notes.  HISTORY  Chief Complaint Otalgia   HPI [name] is a 60 y.o. femalewho presents to the ED for evaluation of ear pain.   Chart review indicates hx HTN.  Patient presents to the ED, accompanied by her boyfriend, for evaluation of a few days of right-sided back pain and right-sided ear pain.  She reports no falls or trauma to her back.  With right-sided paraspinal lumbar back pain is moderate, radiating down her leg and buttocks.  Reports she frequently rides a bicycle.  Denies urinary or stool retention, fevers, saddle anesthesias.  Patient reports aching pain to her right ear without discharge, fevers or congestion.  Past Medical History:  Diagnosis Date   Hypertension     Patient Active Problem List   Diagnosis Date Noted   Cellulitis of right lower leg 06/10/2019   Benign essential HTN 06/10/2019   Angioedema 06/10/2019   Hyponatremia 06/10/2019   Hypokalemia 06/10/2019    Past Surgical History:  Procedure Laterality Date   BRAIN SURGERY     BREAST BIOPSY Left 2011   CORE W/CLIP   TUBAL LIGATION      Prior to Admission medications   Medication Sig Start Date End Date Taking? Authorizing Provider  hydrochlorothiazide (HYDRODIURIL) 25 MG tablet Take 1 tablet (25 mg total) by mouth daily. 02/12/21  Yes Delton Prairie, MD  lidocaine (LIDODERM) 5 % Place 1 patch onto the skin every 12 (twelve) hours. Remove & Discard patch within 12 hours or as directed by MD 02/12/21 02/12/22 Yes Delton Prairie, MD  cyclobenzaprine (FLEXERIL) 10 MG tablet Take 1 tablet (10 mg total) by mouth 3 (three) times daily as needed for muscle spasms. 11/23/20 11/23/21  Willy Eddy, MD  FLUoxetine (PROZAC) 40 MG  capsule Take 1 capsule (40 mg total) by mouth once daily every morning. 11/23/20   Willy Eddy, MD  hydrochlorothiazide (HYDRODIURIL) 25 MG tablet Take 1 tablet (25 mg total) by mouth once daily. 11/23/20 11/23/21  Willy Eddy, MD  naproxen (NAPROSYN) 500 MG tablet TAKE ONE TABLET BY MOUTH 2 TIMES A DAY WITH A MEAL 06/29/20 06/29/21  Menshew, Charlesetta Ivory, PA-C    Allergies Lisinopril  No family history on file.  Social History Social History   Tobacco Use   Smoking status: Every Day    Packs/day: 0.33    Types: Cigarettes   Smokeless tobacco: Never  Vaping Use   Vaping Use: Never used  Substance Use Topics   Alcohol use: Not Currently    Comment: 3-4 40 oz beers per week   Drug use: No    Review of Systems  Constitutional: No fever/chills Eyes: No visual changes. ENT: No sore throat. Right ear pain. Cardiovascular: Denies chest pain. Respiratory: Denies shortness of breath. Gastrointestinal: No abdominal pain.  No nausea, no vomiting.  No diarrhea.  No constipation. Genitourinary: Negative for dysuria. Musculoskeletal: Positive atraumatic right-sided lumbar back pain. Skin: Negative for rash. Neurological: Negative for headaches, focal weakness or numbness.  ____________________________________________   PHYSICAL EXAM:  VITAL SIGNS: Vitals:   02/12/21 1025  BP: (!) 165/120  Pulse: 88  Resp: 18  Temp: 98 F (36.7 C)  SpO2: 100%     Constitutional: Alert and oriented. Well appearing and in no acute distress. Eyes:  Conjunctivae are normal. PERRL. EOMI. Head: Atraumatic. TMs are clear bilaterally without erythema, bulging or purulence.  No pain with ear manipulation, no discharge, no tenderness to mastoid process. Nose: No congestion/rhinnorhea. Mouth/Throat: Mucous membranes are moist.  Oropharynx non-erythematous. Neck: No stridor. No cervical spine tenderness to palpation. Cardiovascular: Normal rate, regular rhythm. Grossly normal heart sounds.   Good peripheral circulation. Respiratory: Normal respiratory effort.  No retractions. Lungs CTAB. Gastrointestinal: Soft , nondistended, nontender to palpation. No CVA tenderness. Musculoskeletal: No lower extremity tenderness nor edema.  No joint effusions. No signs of acute trauma. Paraspinal lumbar tenderness to palpation reproducing her symptoms.  No overlying skin changes or signs of trauma.  No midline spinal tenderness or bony step-offs. Neurologic:  Normal speech and language. No gross focal neurologic deficits are appreciated. No gait instability noted. Cranial nerves II through XII intact 5/5 strength and sensation in all 4 extremities Skin:  Skin is warm, dry and intact. No rash noted. Psychiatric: Mood and affect are normal. Speech and behavior are normal.  ____________________________________________   LABS (all labs ordered are listed, but only abnormal results are displayed)  Labs Reviewed  RESP PANEL BY RT-PCR (FLU A&B, COVID) ARPGX2   ____________________________________________  12 Lead EKG   ____________________________________________  RADIOLOGY  ED MD interpretation:    Official radiology report(s): No results found.  ____________________________________________   PROCEDURES and INTERVENTIONS  Procedure(s) performed (including Critical Care):  Procedures  Medications - No data to display  ____________________________________________   MDM / ED COURSE   60 year old female presents to the ED with right ear pain without evidence of AOM or significant acute pathology.  Right-sided back pain with evidence of sciatica without neurologic deficits, red flag features or indications for spinal imaging.  We will discharge with lidocaine patches and return precautions.     ____________________________________________   FINAL CLINICAL IMPRESSION(S) / ED DIAGNOSES  Final diagnoses:  Right ear pain  Sciatica of right side     ED Discharge Orders           Ordered    lidocaine (LIDODERM) 5 %  Every 12 hours        02/12/21 1302    hydrochlorothiazide (HYDRODIURIL) 25 MG tablet  Daily        02/12/21 1302             Ayani Ospina   Note:  This document was prepared using Conservation officer, historic buildings and may include unintentional dictation errors.    Delton Prairie, MD 02/12/21 1315

## 2021-02-12 NOTE — Discharge Instructions (Addendum)
Use Tylenol for pain and fevers.  Up to 1000 mg per dose, up to 4 times per day.  Do not take more than 4000 mg of Tylenol/acetaminophen within 24 hours..  Please use lidocaine patches and your site of pain.  Apply 1 patch at a time, leave on for 12 hours, then remove for 12 hours.  12 hours on, 12 hours off.  Do not apply more than 1 patch at a time.  

## 2021-03-08 ENCOUNTER — Other Ambulatory Visit: Payer: Self-pay

## 2021-03-08 ENCOUNTER — Emergency Department
Admission: EM | Admit: 2021-03-08 | Discharge: 2021-03-08 | Disposition: A | Discharge status: Home / Self Care | Payer: Self-pay | Attending: Emergency Medicine | Admitting: Emergency Medicine

## 2021-03-08 ENCOUNTER — Emergency Department: Payer: Self-pay

## 2021-03-08 DIAGNOSIS — I10 Essential (primary) hypertension: Secondary | ICD-10-CM

## 2021-03-08 DIAGNOSIS — Z20822 Contact with and (suspected) exposure to covid-19: Secondary | ICD-10-CM | POA: Insufficient documentation

## 2021-03-08 DIAGNOSIS — R55 Syncope and collapse: Secondary | ICD-10-CM | POA: Insufficient documentation

## 2021-03-08 DIAGNOSIS — R42 Dizziness and giddiness: Secondary | ICD-10-CM

## 2021-03-08 DIAGNOSIS — R519 Headache, unspecified: Secondary | ICD-10-CM

## 2021-03-08 DIAGNOSIS — Z79899 Other long term (current) drug therapy: Secondary | ICD-10-CM | POA: Insufficient documentation

## 2021-03-08 DIAGNOSIS — F17210 Nicotine dependence, cigarettes, uncomplicated: Secondary | ICD-10-CM | POA: Insufficient documentation

## 2021-03-08 LAB — CBC
HCT: 38.1 % (ref 36.0–46.0)
Hemoglobin: 13.1 g/dL (ref 12.0–15.0)
MCH: 33.9 pg (ref 26.0–34.0)
MCHC: 34.4 g/dL (ref 30.0–36.0)
MCV: 98.7 fL (ref 80.0–100.0)
Platelets: 236 10*3/uL (ref 150–400)
RBC: 3.86 MIL/uL — ABNORMAL LOW (ref 3.87–5.11)
RDW: 12.7 % (ref 11.5–15.5)
WBC: 6.5 10*3/uL (ref 4.0–10.5)
nRBC: 0 % (ref 0.0–0.2)

## 2021-03-08 LAB — BASIC METABOLIC PANEL
Anion gap: 5 (ref 5–15)
BUN: 22 mg/dL — ABNORMAL HIGH (ref 6–20)
CO2: 23 mmol/L (ref 22–32)
Calcium: 9 mg/dL (ref 8.9–10.3)
Chloride: 110 mmol/L (ref 98–111)
Creatinine, Ser: 0.86 mg/dL (ref 0.44–1.00)
GFR, Estimated: >60 mL/min (ref >60)
Glucose, Bld: 93 mg/dL (ref 70–99)
Potassium: 4.2 mmol/L (ref 3.5–5.1)
Sodium: 138 mmol/L (ref 135–145)

## 2021-03-08 LAB — URINALYSIS, ROUTINE W REFLEX MICROSCOPIC
Bilirubin Urine: NEGATIVE
Glucose, UA: NEGATIVE mg/dL
Hgb urine dipstick: NEGATIVE
Ketones, ur: NEGATIVE mg/dL
Leukocytes,Ua: NEGATIVE
Nitrite: NEGATIVE
Protein, ur: NEGATIVE mg/dL
Specific Gravity, Urine: 1.025 (ref 1.005–1.030)
pH: 6 (ref 5.0–8.0)

## 2021-03-08 LAB — RESP PANEL BY RT-PCR (FLU A&B, COVID) ARPGX2
Influenza A by PCR: NEGATIVE
Influenza B by PCR: NEGATIVE
SARS Coronavirus 2 by RT PCR: NEGATIVE

## 2021-03-08 LAB — TROPONIN I (HIGH SENSITIVITY): Troponin I (High Sensitivity): 4 ng/L (ref <18)

## 2021-03-08 LAB — POC URINE PREG, ED: Preg Test, Ur: NEGATIVE

## 2021-03-08 MED ORDER — HYDROCHLOROTHIAZIDE 25 MG PO TABS
25.0000 mg | ORAL_TABLET | Freq: Once | ORAL | Status: AC
  Administered 2021-03-08: 13:00:00 25 mg via ORAL
  Filled 2021-03-08: qty 1

## 2021-03-08 MED ORDER — BUTALBITAL-APAP-CAFFEINE 50-325-40 MG PO TABS
2.0000 | ORAL_TABLET | Freq: Once | ORAL | Status: AC
  Administered 2021-03-08: 13:00:00 2 via ORAL
  Filled 2021-03-08: qty 2

## 2021-03-08 MED ORDER — HYDROCHLOROTHIAZIDE 25 MG PO TABS
25.0000 mg | ORAL_TABLET | Freq: Every day | ORAL | 0 refills | Status: DC
  Filled 2021-03-08: qty 30, 30d supply, fill #0

## 2021-03-08 MED ORDER — NAPROXEN 500 MG PO TABS
500.0000 mg | ORAL_TABLET | Freq: Once | ORAL | Status: AC
  Administered 2021-03-08: 13:00:00 500 mg via ORAL
  Filled 2021-03-08: qty 1

## 2021-03-08 NOTE — ED Triage Notes (Addendum)
Pt c/o dizziness for the past 3 days, denies N/V . Pt is ambulatory with a steady gait. Pt is HTn in triage, states she has not taken her b/p meds in the past couple of days

## 2021-03-08 NOTE — ED Notes (Signed)
EDP Smith at bedside  

## 2021-03-08 NOTE — ED Provider Notes (Signed)
[facility] Emergency Department Provider Note ____________________________________________   Event Date/Time   First MD Initiated Contact with Patient 03/08/21 1109     (approximate)  I have reviewed the triage vital signs and the nursing notes.  HISTORY  Chief Complaint Dizziness   HPI [name] is a 60 y.o. femalewho presents to the ED for evaluation of dizziness.  Chart review indicates history of hypertension.   Patient presents to the ED for evaluation of 3 days of headache, dizziness and feeling badly in a generalized fashion.  She reports intermittent compliance with her HCTZ, taking it only intermittently to make it last longer.  She reports 3 days of bitemporal headache with associated dizziness/presyncope.  Reports using OTC Tylenol from the dollar store with minimal improvement of her symptoms.  Denies any vertigo, falls or syncope, fever, vision changes or focal weakness to the extremities.  Denies cough or shortness of breath, abdominal pain or emesis.  Past Medical History:  Diagnosis Date   Hypertension     Patient Active Problem List   Diagnosis Date Noted   Cellulitis of right lower leg 06/10/2019   Benign essential HTN 06/10/2019   Angioedema 06/10/2019   Hyponatremia 06/10/2019   Hypokalemia 06/10/2019    Past Surgical History:  Procedure Laterality Date   BRAIN SURGERY     BREAST BIOPSY Left 2011   CORE W/CLIP   TUBAL LIGATION      Prior to Admission medications   Medication Sig Start Date End Date Taking? Authorizing Provider  hydrochlorothiazide (HYDRODIURIL) 25 MG tablet Take 1 tablet (25 mg total) by mouth once daily. 03/08/21  Yes Delton Prairie, MD  cyclobenzaprine (FLEXERIL) 10 MG tablet Take 1 tablet (10 mg total) by mouth 3 (three) times daily as needed for muscle spasms. 11/23/20 11/23/21  Willy Eddy, MD  FLUoxetine (PROZAC) 40 MG capsule Take 1 capsule (40 mg total) by mouth once daily every  morning. 11/23/20   Willy Eddy, MD  naproxen (NAPROSYN) 500 MG tablet TAKE ONE TABLET BY MOUTH 2 TIMES A DAY WITH A MEAL 06/29/20 06/29/21  Menshew, Charlesetta Ivory, PA-C    Allergies Lisinopril  No family history on file.  Social History Social History   Tobacco Use   Smoking status: Every Day    Packs/day: 0.33    Types: Cigarettes   Smokeless tobacco: Never  Vaping Use   Vaping Use: Never used  Substance Use Topics   Alcohol use: Not Currently    Comment: 3-4 40 oz beers per week   Drug use: No    Review of Systems  Constitutional: No fever/chills.  Positive for malaise and dizziness Eyes: No visual changes. ENT: No sore throat. Cardiovascular: Denies chest pain. Respiratory: Denies shortness of breath. Gastrointestinal: No abdominal pain.  No nausea, no vomiting.  No diarrhea.  No constipation. Genitourinary: Negative for dysuria. Musculoskeletal: Negative for back pain. Skin: Negative for rash. Neurological: Negative for focal weakness or numbness.  Positive for headache  ____________________________________________   PHYSICAL EXAM:  VITAL SIGNS: Vitals:   03/08/21 1400 03/08/21 1435  BP: (!) 174/111 (!) 187/109  Pulse: 60 70  Resp:    Temp:    SpO2: 97% 99%    Constitutional: Alert and oriented. Well appearing and in no acute distress. Eyes: Conjunctivae are normal. PERRL. EOMI. Head: Atraumatic. Nose: No congestion/rhinnorhea. Mouth/Throat: Mucous membranes are moist.  Oropharynx non-erythematous. Neck: No stridor. No cervical spine tenderness to palpation. Cardiovascular: Normal rate, regular rhythm. Grossly normal  heart sounds.  Good peripheral circulation. Respiratory: Normal respiratory effort.  No retractions. Lungs CTAB. Gastrointestinal: Soft , nondistended, nontender to palpation. No CVA tenderness. Musculoskeletal: No lower extremity tenderness nor edema.  No joint effusions. No signs of acute trauma. Neurologic:  Normal speech and  language. No gross focal neurologic deficits are appreciated. No gait instability noted. Cranial nerves II through XII intact 5/5 strength and sensation in all 4 extremities Skin:  Skin is warm, dry and intact. No rash noted. Psychiatric: Mood and affect are normal. Speech and behavior are normal. ____________________________________________   LABS (all labs ordered are listed, but only abnormal results are displayed)  Labs Reviewed  BASIC METABOLIC PANEL - Abnormal; Notable for the following components:      Result Value   BUN 22 (*)    All other components within normal limits  CBC - Abnormal; Notable for the following components:   RBC 3.86 (*)    All other components within normal limits  RESP PANEL BY RT-PCR (FLU A&B, COVID) [id number]  URINALYSIS, ROUTINE W REFLEX MICROSCOPIC  POC URINE PREG, ED  CBG MONITORING, ED  TROPONIN I (HIGH SENSITIVITY)   ____________________________________________  12 Lead EKG  Sinus rhythm with a rate of 77 bpm.  Normal axis and intervals.  No evidence of acute ischemia. ____________________________________________  RADIOLOGY  ED MD interpretation: CT head reviewed by me without evidence of acute intracranial pathology.  Official radiology report(s): CT HEAD WO CONTRAST ( )  Result Date: 03/08/2021 CLINICAL DATA:  Dizziness, headache EXAM: CT HEAD WITHOUT CONTRAST TECHNIQUE: Contiguous axial images were obtained from the base of the skull through the vertex without intravenous contrast. COMPARISON:  CT 03/03/2020 FINDINGS: Brain: No acute intracranial hemorrhage. No focal mass lesion. No CT evidence of acute infarction. No midline shift or mass effect. No hydrocephalus. Basilar cisterns are patent. There are periventricular and subcortical white matter hypodensities. Generalized cortical atrophy. Vascular: No hyperdense vessel or unexpected calcification. Skull: Normal. Negative for fracture or focal lesion. Sinuses/Orbits: Paranasal sinuses and  mastoid air cells are clear. Orbits are clear. Other: Craniotomy defect over the LEFT frontal lobe. IMPRESSION: 1. No acute intracranial findings. 2. No change from prior. 3. 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 Electronically Signed   By: Genevive Bi M.D.   On: 03/08/2021 13:09    ____________________________________________   PROCEDURES and INTERVENTIONS  Procedure(s) performed (including Critical Care):  .1-3 Lead EKG Interpretation Performed by: Delton Prairie, MD Authorized by: Delton Prairie, MD     Interpretation: normal     ECG rate:  70   ECG rate assessment: normal     Rhythm: sinus rhythm     Ectopy: none     Conduction: normal    Medications  butalbital-acetaminophen-caffeine (FIORICET) 50-325-40 MG per tablet 2 tablet (2 tablets Oral Given 03/08/21 1247)  naproxen (NAPROSYN) tablet 500 mg (500 mg Oral Given 03/08/21 1247)  hydrochlorothiazide (HYDRODIURIL) tablet 25 mg (25 mg Oral Given 03/08/21 1247)    ____________________________________________   MDM / ED COURSE   60 year old female presents to the ED with 3 days of a bad headache.  She is hypertensive, typical for her and she has not been consistently compliant with her medication regimen.  She overall looks well to me without evidence of neurologic or vascular deficits.  No signs of trauma.  Blood work is benign.  We will provide a dose of her home antihypertensives, Tylenol and naproxen, and CT her head to assess for signs of ICH or CVA.  Low suspicion for this.  Clinical Course as of 03/08/21 1440  Thu Mar 08, 2021  1400 Reassessed.  Patient reports feeling better.  She is requesting a sandwich and to discharge. [DS]    Clinical Course User Index [DS] Delton Prairie, MD    ____________________________________________   FINAL CLINICAL IMPRESSION(S) / ED DIAGNOSES  Final diagnoses:  Primary hypertension  Dizziness  Bad headache     ED Discharge Orders          Ordered     hydrochlorothiazide (HYDRODIURIL) 25 MG tablet  Daily        03/08/21 1415             [name]   Note:  This document was prepared using Conservation officer, historic buildings and may include unintentional dictation errors.    Delton Prairie, MD 03/08/21 [id number]

## 2021-03-08 NOTE — ED Notes (Signed)
Visitor at bedside.

## 2021-03-08 NOTE — ED Notes (Signed)
Pt given soda as requested with verbal okay from EDP D. Smith.

## 2021-03-08 NOTE — Discharge Instructions (Addendum)
Use Tylenol for pain and fevers.  Up to 1000 mg per dose, up to 4 times per day.  Do not take more than 4000 mg of Tylenol/acetaminophen within 24 hours..  

## 2021-03-08 NOTE — ED Notes (Signed)
Pt given remote to tv, warm blanket, and room temp inc per pt request. Stretcher locked low. Rail up. Call bell within reach.

## 2021-03-08 NOTE — ED Notes (Signed)
Pt up to bedside toilet.  

## 2021-03-08 NOTE — ED Notes (Signed)
Pt requested sandwich tray. Checked fridge and had none; notified pt and gave pt any snacks available. Pt appreciative.

## 2021-04-17 ENCOUNTER — Other Ambulatory Visit: Payer: Self-pay

## 2021-05-08 ENCOUNTER — Encounter: Payer: Self-pay | Admitting: Emergency Medicine

## 2021-05-08 ENCOUNTER — Other Ambulatory Visit: Payer: Self-pay

## 2021-05-08 ENCOUNTER — Emergency Department: Payer: Medicaid Other

## 2021-05-08 ENCOUNTER — Emergency Department
Admission: EM | Admit: 2021-05-08 | Discharge: 2021-05-08 | Disposition: A | Discharge status: Home / Self Care | Payer: Medicaid Other | Attending: Emergency Medicine | Admitting: Emergency Medicine

## 2021-05-08 DIAGNOSIS — I10 Essential (primary) hypertension: Secondary | ICD-10-CM | POA: Insufficient documentation

## 2021-05-08 DIAGNOSIS — R0789 Other chest pain: Secondary | ICD-10-CM | POA: Insufficient documentation

## 2021-05-08 LAB — BASIC METABOLIC PANEL
Anion gap: 8 (ref 5–15)
BUN: 15 mg/dL (ref 6–20)
CO2: 26 mmol/L (ref 22–32)
Calcium: 9.2 mg/dL (ref 8.9–10.3)
Chloride: 106 mmol/L (ref 98–111)
Creatinine, Ser: 0.76 mg/dL (ref 0.44–1.00)
GFR, Estimated: >60 mL/min (ref >60)
Glucose, Bld: 96 mg/dL (ref 70–99)
Potassium: 4.5 mmol/L (ref 3.5–5.1)
Sodium: 140 mmol/L (ref 135–145)

## 2021-05-08 LAB — CBC
HCT: 39.5 % (ref 36.0–46.0)
Hemoglobin: 13.1 g/dL (ref 12.0–15.0)
MCH: 33.6 pg (ref 26.0–34.0)
MCHC: 33.2 g/dL (ref 30.0–36.0)
MCV: 101.3 fL — ABNORMAL HIGH (ref 80.0–100.0)
Platelets: 233 10*3/uL (ref 150–400)
RBC: 3.9 MIL/uL (ref 3.87–5.11)
RDW: 13.6 % (ref 11.5–15.5)
WBC: 7 10*3/uL (ref 4.0–10.5)
nRBC: 0 % (ref 0.0–0.2)

## 2021-05-08 LAB — TROPONIN I (HIGH SENSITIVITY)
Troponin I (High Sensitivity): 3 ng/L (ref <18)
Troponin I (High Sensitivity): 4 ng/L (ref <18)

## 2021-05-08 MED ORDER — LIDOCAINE 5 % EX PTCH
1.0000 | MEDICATED_PATCH | CUTANEOUS | Status: DC
  Administered 2021-05-08: 1 via TRANSDERMAL
  Filled 2021-05-08: qty 1

## 2021-05-08 MED ORDER — ACETAMINOPHEN 500 MG PO TABS
1000.0000 mg | ORAL_TABLET | Freq: Once | ORAL | Status: AC
  Administered 2021-05-08: 1000 mg via ORAL
  Filled 2021-05-08: qty 2

## 2021-05-08 MED ORDER — HYDROCHLOROTHIAZIDE 25 MG PO TABS
25.0000 mg | ORAL_TABLET | Freq: Every day | ORAL | 1 refills | Status: DC
  Filled 2021-05-08: qty 30, 30d supply, fill #0

## 2021-05-08 MED ORDER — LIDOCAINE 5 % EX PTCH
1.0000 | MEDICATED_PATCH | Freq: Two times a day (BID) | CUTANEOUS | 0 refills | Status: AC
  Filled 2021-05-08: qty 10, 10d supply, fill #0

## 2021-05-08 NOTE — Discharge Instructions (Signed)
Use Tylenol for pain and fevers.  Up to 1000 mg per dose, up to 4 times per day.  Do not take more than 4000 mg of Tylenol/acetaminophen within 24 hours.. ° °Please use lidocaine patches at your site of pain.  Apply 1 patch at a time, leave on for 12 hours, then remove for 12 hours.  12 hours on, 12 hours off.  Do not apply more than 1 patch at a time. ° °

## 2021-05-08 NOTE — ED Triage Notes (Signed)
Pt  to ED via POV for chest pain that started last night. Pt states that the pain is in the center of her chest. Pt denies radiation of the pain or any other symptoms. Pt is in NAD.

## 2021-05-08 NOTE — ED Provider Notes (Signed)
[facility] Provider Note    Event Date/Time   First MD Initiated Contact with Patient 05/08/21 1339     (approximate)   History   Chest Pain   HPI  [name] is a 61 y.o. female who presents to the ED for evaluation of Chest Pain   History of hypertension without known coronary disease.  Patient presents to the ED for evaluation of an episode of chest pain that occurred last night.  She reports pain that lasted a matter of hours before improving.  Reports some mild residual pain now.  Reports pain is worse when she changes positions or presses the area.  Denies any associated symptoms such as dyspnea, dizziness, emesis, trauma, falls or syncope.  Physical Exam   Triage Vital Signs: ED Triage Vitals  Enc Vitals Group     BP 05/08/21 1034 (!) 167/110     Pulse Rate 05/08/21 1034 70     Resp 05/08/21 1034 16     Temp 05/08/21 1036 98.7 F (37.1 C)     Temp Source 05/08/21 1036 Oral     SpO2 05/08/21 1034 99 %     Weight 05/08/21 1034 170 lb (77.1 kg)     Height 05/08/21 1034 5\' 7"  (1.702 m)     Head Circumference --      Peak Flow --      Pain Score 05/08/21 1034 8     Pain Loc --      Pain Edu? --      Excl. in GC? --     Most recent vital signs: Vitals:   05/08/21 1036 05/08/21 1255  BP:  (!) 159/104  Pulse:  71  Resp:  16  Temp: 98.7 F (37.1 C)   SpO2:  100%    General: Awake, no distress.  CV:  Good peripheral perfusion. RRR Resp:  Normal effort.  Abd:  No distention.  MSK:  No deformity noted.  Chest pain is reproducible on palpation without overlying skin changes or signs of trauma. Neuro:  No focal deficits appreciated. Cranial nerves II through XII intact 5/5 strength and sensation in all 4 extremities Other:     ED Results / Procedures / Treatments   Labs (all labs ordered are listed, but only abnormal results are displayed) Labs Reviewed  CBC - Abnormal; Notable for the following components:       Result Value   MCV 101.3 (*)    All other components within normal limits  BASIC METABOLIC PANEL  POC URINE PREG, ED  TROPONIN I (HIGH SENSITIVITY)  TROPONIN I (HIGH SENSITIVITY)    EKG Sinus rhythm, rate of 65 bpm.  Normal axis and intervals.  No evidence of acute ischemia.  RADIOLOGY CXR reviewed by me without evidence of acute cardiopulmonary pathology.  Official radiology report(s): DG Chest 2 View  Result Date: 05/08/2021 CLINICAL DATA:  Chest pain EXAM: CHEST - 2 VIEW COMPARISON:  03/03/2020 FINDINGS: The cardiac silhouette, mediastinal and hilar contours are stable. There is moderate tortuosity and calcification of the thoracic aorta for age. The lungs are clear of an acute process. No pulmonary lesions or pleural effusions. The bony thorax is intact. IMPRESSION: No acute cardiopulmonary findings. Stable appearing age advanced tortuosity, ectasia and calcification of the thoracic aorta. Electronically Signed   By: 03/05/2020 M.D.   On: 05/08/2021 11:10    PROCEDURES and INTERVENTIONS:  .1-3 Lead EKG Interpretation Performed by: 05/10/2021, MD Authorized by: Delton Prairie,  MD     Interpretation: normal     ECG rate:  70   ECG rate assessment: normal     Rhythm: sinus rhythm     Ectopy: none     Conduction: normal    Medications  lidocaine (LIDODERM) 5 % 1 patch (1 patch Transdermal Patch Applied 05/08/21 1429)  acetaminophen (TYLENOL) tablet 1,000 mg (1,000 mg Oral Given 05/08/21 1429)     IMPRESSION / MDM / ASSESSMENT AND PLAN / ED COURSE  I reviewed the triage vital signs and the nursing notes.  61 year old female presents to the ED with atypical chest pains without evidence of acute cardiac pathology, possibly MSK in etiology, and suitable for outpatient management.  Mild hypertension, and she is requesting a refill for HCTZ because he ran out recently.  Otherwise normal vitals on room air.  She looks clinically well to me without evidence of trauma, neurologic or  vascular deficits.  No signs of herpes zoster.  Chest pain is reproducible on palpation, reason the possibility of MSK etiology.  Blood work is benign with normal CBC and BMP.  EKG is nonischemic and 2 high-sensitivity troponins are negative.  I considered observation admission for chest pain, but after resolution with Tylenol and lidocaine patch she is requesting discharge and I think this is reasonable.  We discussed return precautions.  Clinical Course as of 05/08/21 1450  Tue May 08, 2021  1450 Reassessed.  Pain improving. [DS]    Clinical Course User Index [DS] Delton Prairie, MD     FINAL CLINICAL IMPRESSION(S) / ED DIAGNOSES   Final diagnoses:  Other chest pain     Rx / DC Orders   ED Discharge Orders          Ordered    lidocaine (LIDODERM) 5 %  Every 12 hours        05/08/21 1421    hydrochlorothiazide (HYDRODIURIL) 25 MG tablet  Daily        05/08/21 1421             Note:  This document was prepared using Dragon voice recognition software and may include unintentional dictation errors.   Delton Prairie, MD 05/08/21 1452

## 2021-05-08 NOTE — ED Notes (Signed)
Pt eating lunch

## 2021-07-20 ENCOUNTER — Other Ambulatory Visit: Payer: Self-pay

## 2021-07-31 ENCOUNTER — Telehealth: Payer: Self-pay | Admitting: Pharmacy Technician

## 2021-07-31 ENCOUNTER — Other Ambulatory Visit: Payer: Self-pay

## 2021-07-31 ENCOUNTER — Emergency Department
Admission: EM | Admit: 2021-07-31 | Discharge: 2021-07-31 | Disposition: A | Discharge status: Home / Self Care | Payer: Medicaid Other | Attending: Emergency Medicine | Admitting: Emergency Medicine

## 2021-07-31 DIAGNOSIS — Z79899 Other long term (current) drug therapy: Secondary | ICD-10-CM | POA: Insufficient documentation

## 2021-07-31 DIAGNOSIS — Z760 Encounter for issue of repeat prescription: Secondary | ICD-10-CM | POA: Diagnosis not present

## 2021-07-31 DIAGNOSIS — I10 Essential (primary) hypertension: Secondary | ICD-10-CM | POA: Diagnosis present

## 2021-07-31 MED ORDER — HYDROCHLOROTHIAZIDE 25 MG PO TABS
25.0000 mg | ORAL_TABLET | Freq: Every day | ORAL | Status: DC
  Administered 2021-07-31: 25 mg via ORAL
  Filled 2021-07-31: qty 1

## 2021-07-31 MED ORDER — CLONIDINE HCL 0.1 MG PO TABS
0.1000 mg | ORAL_TABLET | Freq: Once | ORAL | Status: AC
  Administered 2021-07-31: 0.1 mg via ORAL
  Filled 2021-07-31: qty 1

## 2021-07-31 MED ORDER — HYDROCHLOROTHIAZIDE 25 MG PO TABS
25.0000 mg | ORAL_TABLET | Freq: Every day | ORAL | 6 refills | Status: DC
  Filled 2021-07-31: qty 30, 30d supply, fill #0

## 2021-07-31 NOTE — ED Provider Triage Note (Signed)
Emergency Medicine Provider Triage Evaluation Note ? ?[name] , a 61 y.o. female  was evaluated in triage.  Pt complains of med refill for hypertension meds.  Is been out of medications for 2 weeks.. ? ?Review of Systems  ?Positive: Med refill ?Negative: Chest pain or shortness of breath ? ?Physical Exam  ?BP (!) 201/118   Pulse 80   Temp 99.1 ?F (37.3 ?C) (Oral)   Resp 20   SpO2 99%  ?Gen:   Awake, no distress   ?Resp:  Normal effort  ?MSK:   Moves extremities without difficulty  ?Other:  Patient's blood pressure is grossly elevated at 201/118 ? ?Medical Decision Making  ?Medically screening exam initiated at 2:30 PM.  Appropriate orders placed.  Farra Vonasek was informed that the remainder of the evaluation will be completed by another provider, this initial triage assessment does not replace that evaluation, and the importance of remaining in the ED until their evaluation is complete. ? ?We will give patient her regular medications along with Catapres.  States she normally takes hydrochlorothiazide.  Says she takes lisinopril but on her chart it is listed as an allergy.  So I will give her Catapres instead.  Anticipate discharge.  Patient would like the medication sent to medication management. ?  ?Versie Starks, PA-C ?07/31/21 1431 ? ?

## 2021-07-31 NOTE — ED Provider Notes (Signed)
? ?[facility] ?Provider Note ? ? ? Event Date/Time  ? First MD Initiated Contact with Patient 07/31/21 1445   ?  (approximate) ? ? ?History  ? ?Medication Refill ? ? ?HPI ? ?[name] is a 61 y.o. female presents emergency department stating that she is out of her blood pressure medication.  Is been out of her medicine for over 2 weeks.  No chest pain or shortness of breath. ? ?  ? ? ?Physical Exam  ? ?Triage Vital Signs: ?ED Triage Vitals  ?Enc Vitals Group  ?   BP 07/31/21 1427 (!) 201/118  ?   Pulse Rate 07/31/21 1427 80  ?   Resp 07/31/21 1427 20  ?   Temp 07/31/21 1427 99.1 ?F (37.3 ?C)  ?   Temp Source 07/31/21 1427 Oral  ?   SpO2 07/31/21 1427 99 %  ?   Weight 07/31/21 1444 169 lb 15.6 oz (77.1 kg)  ?   Height 07/31/21 1444 5\' 7"  (1.702 m)  ?   Head Circumference --   ?   Peak Flow --   ?   Pain Score 07/31/21 1408 0  ?   Pain Loc --   ?   Pain Edu? --   ?   Excl. in GC? --   ? ? ?Most recent vital signs: ?Vitals:  ? 07/31/21 1427 07/31/21 1521  ?BP: (!) 201/118 (!) 168/106  ?Pulse: 80 72  ?Resp: 20 19  ?Temp: 99.1 ?F (37.3 ?C)   ?SpO2: 99% 98%  ? ? ? ?General: Awake, no distress.   ?CV:  Good peripheral perfusion. regular rate and  rhythm ?Resp:  Normal effort. ?Abd:  No distention.   ?Other:  Patient's blood pressure is elevated at 201/118 ? ? ?ED Results / Procedures / Treatments  ? ?Labs ?(all labs ordered are listed, but only abnormal results are displayed) ?Labs Reviewed - No data to display ? ? ?EKG ? ? ? ? ?RADIOLOGY ? ? ? ? ?PROCEDURES: ? ? ?Procedures ? ? ?MEDICATIONS ORDERED IN ED: ?Medications  ?hydrochlorothiazide (HYDRODIURIL) tablet 25 mg (25 mg Oral Given 07/31/21 1453)  ?cloNIDine (CATAPRES) tablet 0.1 mg (0.1 mg Oral Given 07/31/21 1453)  ? ? ? ?IMPRESSION / MDM / ASSESSMENT AND PLAN / ED COURSE  ?I reviewed the triage vital signs and the nursing notes. ?             ?               ? ?Differential diagnosis includes, but is not limited to, primary  hypertension, hypertension emergency, CVA ? ?Patient appears to not have a CVA as she is acting normal with no altered mental status or slurred speech.  I do feel that she has primary hypertension but is not in the hypertensive emergency.  We gave her her regular blood pressure medication and will recheck her blood pressure.  If it starts to decrease we will discharge her home with her blood pressure medication. ? ?Patient's blood pressure did improve.  Sent a prescription to medication management for her.  She is discharged stable condition. ? ? ?  ? ? ?FINAL CLINICAL IMPRESSION(S) / ED DIAGNOSES  ? ?Final diagnoses:  ?Primary hypertension  ?Medication refill  ? ? ? ?Rx / DC Orders  ? ?ED Discharge Orders   ? ?      Ordered  ?  hydrochlorothiazide (HYDRODIURIL) 25 MG tablet  Daily       ? 07/31/21  1523  ? ?  ?  ? ?  ? ? ? ?Note:  This document was prepared using Dragon voice recognition software and may include unintentional dictation errors. ? ?  ?Versie Starks, PA-C ?07/31/21 1525 ? ?  ?Harvest Dark, MD ?07/31/21 1532 ? ?

## 2021-07-31 NOTE — ED Triage Notes (Signed)
Pt comes with needing medication refill of Bp meds. Pt states she has been out for two weeks. Pt states she has been waiting for her insurance to go through. ?

## 2021-07-31 NOTE — Discharge Instructions (Signed)
Follow-up with your regular doctor.  Return for worsening.  Take your medication as prescribed ?

## 2021-07-31 NOTE — Telephone Encounter (Signed)
Patient has Medicaid with prescription drug coverage.  No longer qualifies to receive free medications from [facility]. ? ?[name] ?Care Manager ?[facility] ?

## 2021-08-26 IMAGING — CR DG CHEST 2V
2 series · 2 of 2 positions shown · non-contrast
Comparison: Radiograph 02/19/2020.

CLINICAL DATA: Altered mental status. "evaluate for infiltrate ?

EXAM:
CHEST - 2 VIEW

[chest pa]
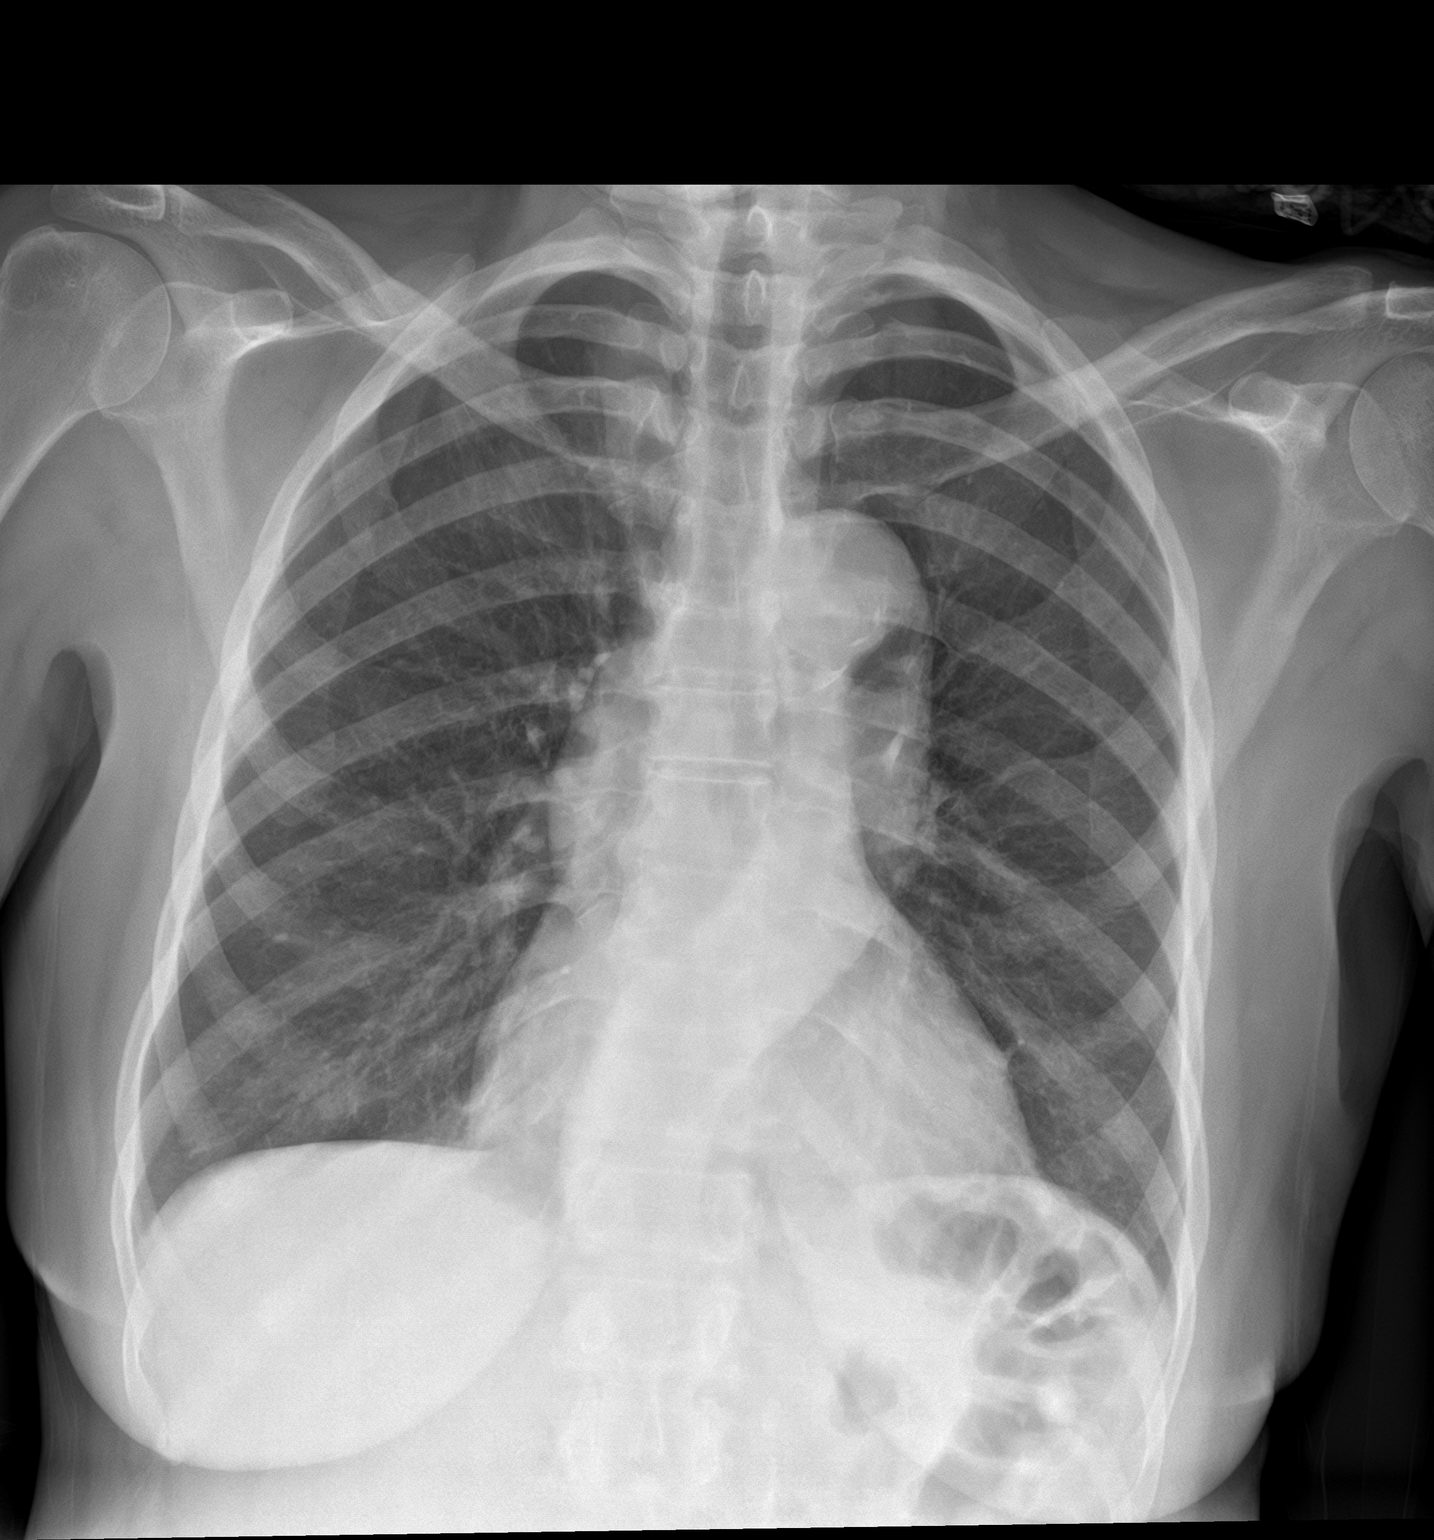

[chest lat]
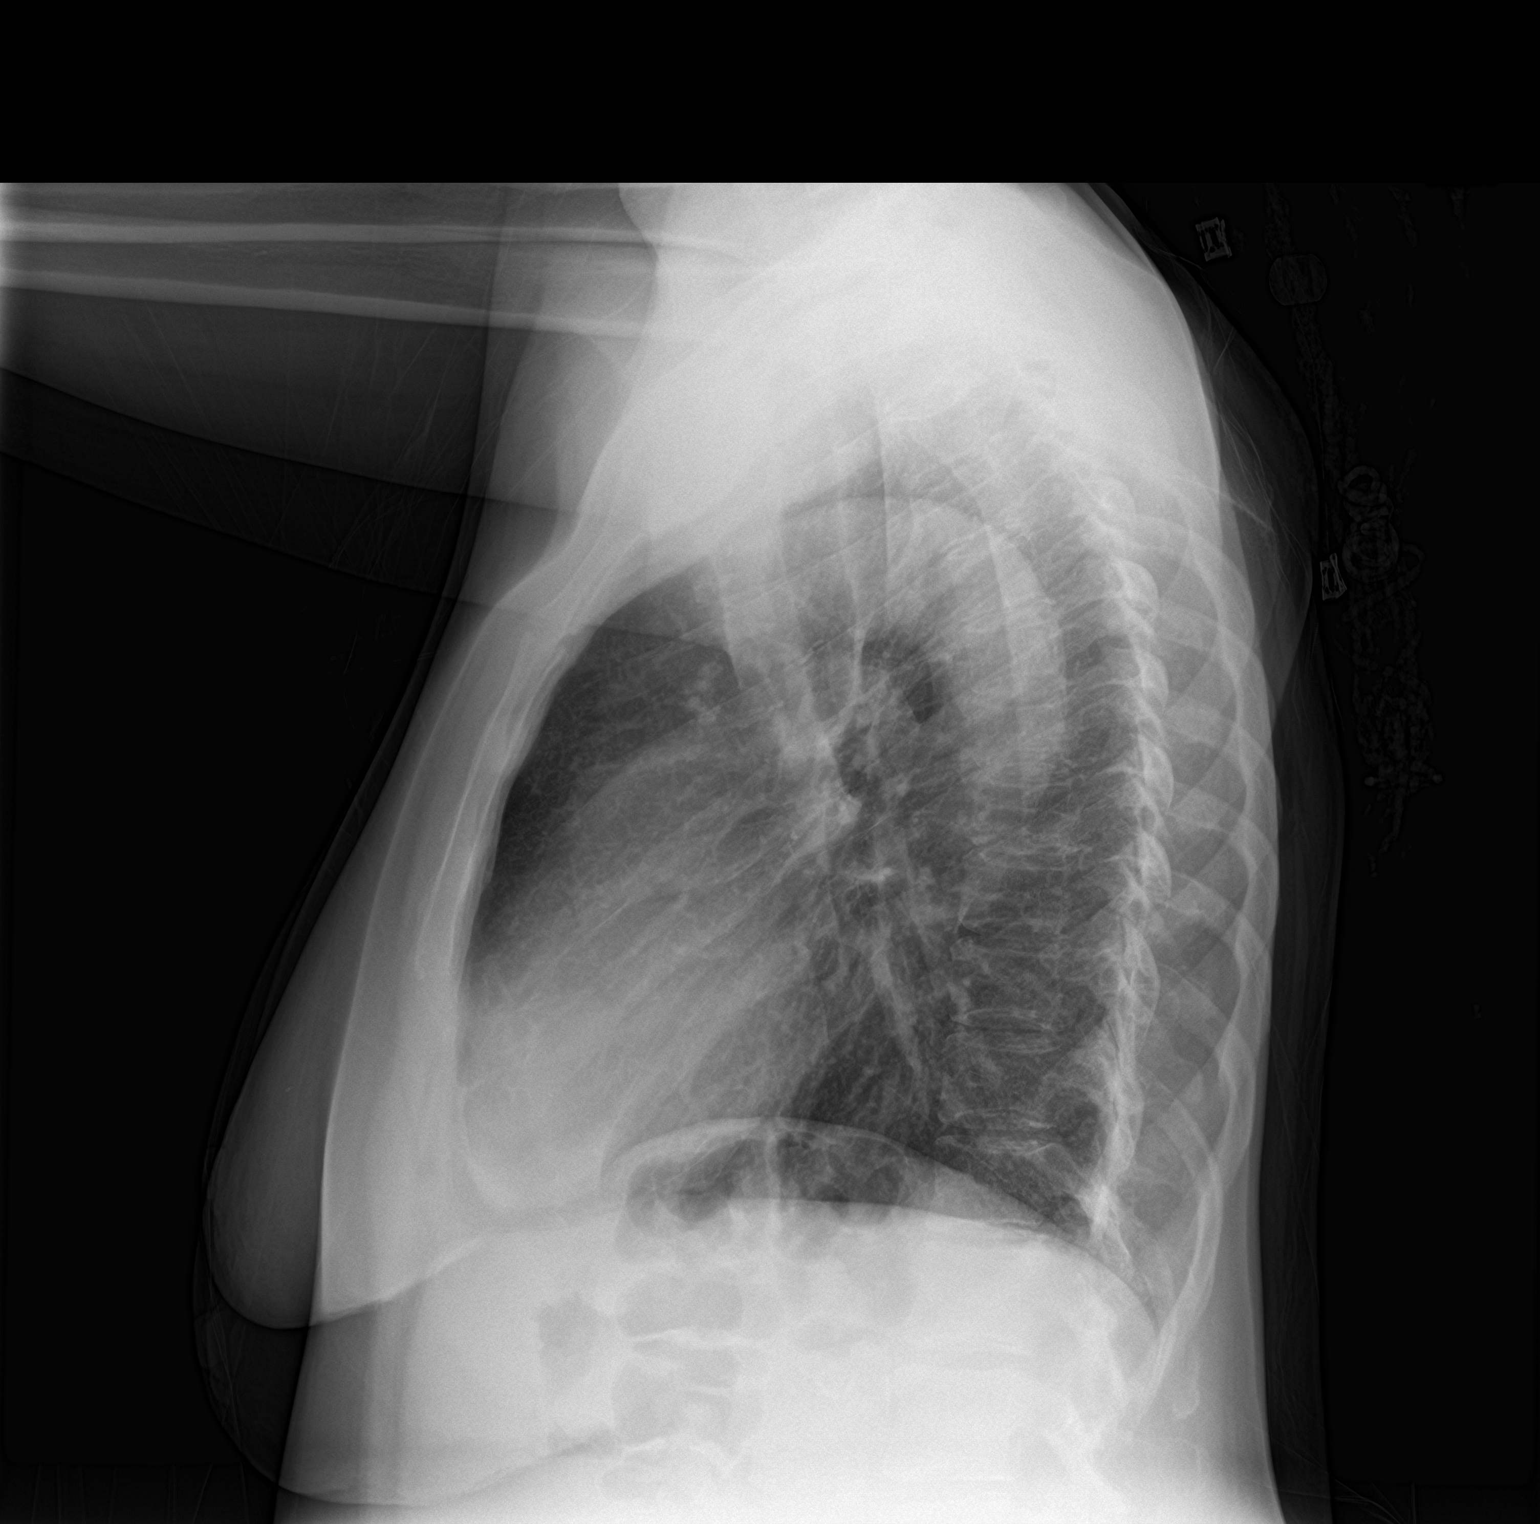

[2 of 2 positions shown; findings below may reference images not displayed]

FINDINGS: The heart is normal in size. Stable aortic tortuosity and
atherosclerosis. The lungs are clear. Pulmonary vasculature is
normal. No consolidation, pleural effusion, or pneumothorax. No
acute osseous abnormalities are seen.
IMPRESSION: No acute findings or evidence of pneumonia.

## 2021-09-15 ENCOUNTER — Other Ambulatory Visit: Payer: Self-pay

## 2021-09-15 ENCOUNTER — Emergency Department: Payer: Medicaid Other

## 2021-09-15 ENCOUNTER — Emergency Department
Admission: EM | Admit: 2021-09-15 | Discharge: 2021-09-15 | Disposition: A | Discharge status: Home / Self Care | Payer: Medicaid Other | Attending: Emergency Medicine | Admitting: Emergency Medicine

## 2021-09-15 DIAGNOSIS — Z79899 Other long term (current) drug therapy: Secondary | ICD-10-CM | POA: Diagnosis not present

## 2021-09-15 DIAGNOSIS — Y92410 Unspecified street and highway as the place of occurrence of the external cause: Secondary | ICD-10-CM | POA: Diagnosis not present

## 2021-09-15 DIAGNOSIS — S92512A Displaced fracture of proximal phalanx of left lesser toe(s), initial encounter for closed fracture: Secondary | ICD-10-CM | POA: Diagnosis not present

## 2021-09-15 DIAGNOSIS — S92352A Displaced fracture of fifth metatarsal bone, left foot, initial encounter for closed fracture: Secondary | ICD-10-CM | POA: Insufficient documentation

## 2021-09-15 DIAGNOSIS — S92902A Unspecified fracture of left foot, initial encounter for closed fracture: Secondary | ICD-10-CM

## 2021-09-15 DIAGNOSIS — S92342A Displaced fracture of fourth metatarsal bone, left foot, initial encounter for closed fracture: Secondary | ICD-10-CM | POA: Insufficient documentation

## 2021-09-15 DIAGNOSIS — S50312A Abrasion of left elbow, initial encounter: Secondary | ICD-10-CM | POA: Diagnosis not present

## 2021-09-15 DIAGNOSIS — S99922A Unspecified injury of left foot, initial encounter: Secondary | ICD-10-CM | POA: Diagnosis present

## 2021-09-15 DIAGNOSIS — I10 Essential (primary) hypertension: Secondary | ICD-10-CM | POA: Diagnosis not present

## 2021-09-15 DIAGNOSIS — S92332A Displaced fracture of third metatarsal bone, left foot, initial encounter for closed fracture: Secondary | ICD-10-CM | POA: Diagnosis not present

## 2021-09-15 DIAGNOSIS — S92322A Displaced fracture of second metatarsal bone, left foot, initial encounter for closed fracture: Secondary | ICD-10-CM | POA: Insufficient documentation

## 2021-09-15 MED ORDER — OXYCODONE-ACETAMINOPHEN 5-325 MG PO TABS
2.0000 | ORAL_TABLET | Freq: Once | ORAL | Status: AC
  Administered 2021-09-15: 2 via ORAL
  Filled 2021-09-15: qty 2

## 2021-09-15 MED ORDER — HYDROCHLOROTHIAZIDE 25 MG PO TABS: 25.0000 mg | ORAL_TABLET | Freq: Every day | ORAL | 1 refills | Status: DC

## 2021-09-15 MED ORDER — OXYCODONE-ACETAMINOPHEN 5-325 MG PO TABS: 2.0000 | ORAL_TABLET | Freq: Four times a day (QID) | ORAL | 0 refills | Status: DC | PRN

## 2021-09-15 MED ORDER — ONDANSETRON 4 MG PO TBDP: 4.0000 mg | ORAL_TABLET | Freq: Four times a day (QID) | ORAL | 0 refills | Status: DC | PRN

## 2021-09-15 MED ORDER — ONDANSETRON 4 MG PO TBDP
4.0000 mg | ORAL_TABLET | Freq: Once | ORAL | Status: AC
  Administered 2021-09-15: 4 mg via ORAL
  Filled 2021-09-15: qty 1
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
## 2021-09-15 NOTE — ED Notes (Signed)
Pt outside, will attempt to revital when pt returns to lobby.

## 2021-09-15 NOTE — Discharge Instructions (Addendum)
Please follow up with podiatry for your multiple foot fractures.  You will likely need surgery.  Please keep your splint on at all times and you should not bear any weight on this foot and will need to use crutches to get around.  Your xray showed - Acute mildly comminuted and displaced fractures of the 2nd through 5th Left metatarsals (5th metatarsal fractured in two places) and at the base of the 3rd proximal phalanx.   You are being provided a prescription for opiates (also known as narcotics) for pain control.  Opiates can be addictive and should only be used when absolutely necessary for pain control when other alternatives do not work.  We recommend you only use them for the recommended amount of time and only as prescribed.  Please do not take with other sedative medications or alcohol.  Please do not drive, operate machinery, make important decisions while taking opiates.  Please note that these medications can be addictive and have high abuse potential.  Patients can become addicted to narcotics after only taking them for a few days.  Please keep these medications locked away from children, teenagers or any family members with history of substance abuse.  Narcotic pain medicine may also make you constipated.  You may use over-the-counter medications such as MiraLAX, Colace to prevent constipation.  If you become constipated, you may use over-the-counter enemas as needed.  Itching and nausea are also common side effects of narcotic pain medication.  If you develop uncontrolled vomiting or a rash, please stop these medications and seek medical care.

## 2021-09-19 ENCOUNTER — Telehealth: Payer: Self-pay | Admitting: *Deleted
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
## 2021-09-19 NOTE — Telephone Encounter (Signed)
"  I'm calling in regards to [facility].  Please give me a call back."

## 2021-09-19 NOTE — Telephone Encounter (Signed)
I called the patient and informed her that she needs to be seen sooner.  I rescheduled her appointment from 10/02/2021 to 09/21/2021 at 12:45 pm with Dr. Logan Bores in [place].

## 2021-09-21 ENCOUNTER — Ambulatory Visit: Payer: Medicaid Other | Admitting: Podiatry

## 2021-10-02 ENCOUNTER — Ambulatory Visit: Payer: Medicaid Other | Admitting: Podiatry

## 2021-10-05 ENCOUNTER — Ambulatory Visit (INDEPENDENT_AMBULATORY_CARE_PROVIDER_SITE_OTHER): Payer: Medicaid Other

## 2021-10-05 ENCOUNTER — Telehealth: Payer: Self-pay | Admitting: *Deleted

## 2021-10-05 ENCOUNTER — Encounter: Payer: Self-pay | Admitting: Podiatry

## 2021-10-05 ENCOUNTER — Ambulatory Visit: Payer: Medicaid Other | Admitting: Podiatry

## 2021-10-05 ENCOUNTER — Other Ambulatory Visit: Payer: Self-pay

## 2021-10-05 ENCOUNTER — Other Ambulatory Visit: Payer: Self-pay | Admitting: Podiatry

## 2021-10-05 DIAGNOSIS — S92345A Nondisplaced fracture of fourth metatarsal bone, left foot, initial encounter for closed fracture: Secondary | ICD-10-CM | POA: Diagnosis not present

## 2021-10-05 DIAGNOSIS — R609 Edema, unspecified: Secondary | ICD-10-CM | POA: Diagnosis not present

## 2021-10-05 DIAGNOSIS — S92302A Fracture of unspecified metatarsal bone(s), left foot, initial encounter for closed fracture: Secondary | ICD-10-CM

## 2021-10-05 MED ORDER — GENTAMICIN SULFATE 0.1 % EX CREA: 1.0000 | TOPICAL_CREAM | Freq: Two times a day (BID) | CUTANEOUS | 1 refills | Status: DC

## 2021-10-05 MED ORDER — HYDROCODONE-ACETAMINOPHEN 10-325 MG PO TABS: 1.0000 | ORAL_TABLET | ORAL | 0 refills | Status: AC | PRN

## 2021-10-05 MED ORDER — HYDROCODONE-ACETAMINOPHEN 10-325 MG PO TABS
1.0000 | ORAL_TABLET | ORAL | 0 refills | Status: DC | PRN
  Filled 2021-10-05: qty 30, 5d supply, fill #0

## 2021-10-05 MED ORDER — GENTAMICIN SULFATE 0.1 % EX CREA
1.0000 | TOPICAL_CREAM | Freq: Two times a day (BID) | CUTANEOUS | 1 refills | Status: DC
Start: 2021-10-05 — End: 2021-10-05
  Filled 2021-10-05: qty 30, 15d supply, fill #0

## 2021-10-05 NOTE — Telephone Encounter (Signed)
Prescriptions resent.  Thanks, Dr. Logan Bores
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
## 2021-10-05 NOTE — Telephone Encounter (Signed)
"  This is [name]'s daughter.  We just left the doctor for her ankle.  We asked the doctor to send the prescription to [facility] pharmacy, not [facility].  Please give me a call back."

## 2021-10-23 ENCOUNTER — Ambulatory Visit (INDEPENDENT_AMBULATORY_CARE_PROVIDER_SITE_OTHER): Payer: Medicaid Other | Admitting: Podiatry

## 2021-10-23 ENCOUNTER — Encounter: Payer: Self-pay | Admitting: Podiatry

## 2021-10-23 DIAGNOSIS — L089 Local infection of the skin and subcutaneous tissue, unspecified: Secondary | ICD-10-CM

## 2021-10-23 DIAGNOSIS — L97922 Non-pressure chronic ulcer of unspecified part of left lower leg with fat layer exposed: Secondary | ICD-10-CM

## 2021-10-23 DIAGNOSIS — S92345A Nondisplaced fracture of fourth metatarsal bone, left foot, initial encounter for closed fracture: Secondary | ICD-10-CM

## 2021-10-23 MED ORDER — OXYCODONE-ACETAMINOPHEN 5-325 MG PO TABS: 1.0000 | ORAL_TABLET | ORAL | 0 refills | Status: AC | PRN

## 2021-10-23 MED ORDER — DOXYCYCLINE HYCLATE 100 MG PO TABS: 100.0000 mg | ORAL_TABLET | Freq: Two times a day (BID) | ORAL | 0 refills | Status: DC

## 2021-10-23 MED ORDER — GENTAMICIN SULFATE 0.1 % EX CREA: 1.0000 | TOPICAL_CREAM | Freq: Two times a day (BID) | CUTANEOUS | 1 refills | Status: DC

## 2021-10-23 NOTE — Addendum Note (Signed)
Addended by: Felecia Shelling on: 10/23/2021 01:19 PM   Modules accepted: Orders
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
## 2021-11-08 ENCOUNTER — Encounter: Payer: Medicaid Other | Attending: Physician Assistant | Admitting: Physician Assistant

## 2021-11-08 DIAGNOSIS — X58XXXA Exposure to other specified factors, initial encounter: Secondary | ICD-10-CM | POA: Diagnosis not present

## 2021-11-08 DIAGNOSIS — F17200 Nicotine dependence, unspecified, uncomplicated: Secondary | ICD-10-CM | POA: Insufficient documentation

## 2021-11-08 DIAGNOSIS — I10 Essential (primary) hypertension: Secondary | ICD-10-CM | POA: Diagnosis not present

## 2021-11-08 DIAGNOSIS — I872 Venous insufficiency (chronic) (peripheral): Secondary | ICD-10-CM | POA: Diagnosis not present

## 2021-11-08 DIAGNOSIS — L97822 Non-pressure chronic ulcer of other part of left lower leg with fat layer exposed: Secondary | ICD-10-CM | POA: Insufficient documentation

## 2021-11-08 DIAGNOSIS — S81812A Laceration without foreign body, left lower leg, initial encounter: Secondary | ICD-10-CM | POA: Diagnosis present

## 2021-11-08 NOTE — Progress Notes (Addendum)
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 [facility] Procedures CPT4 Code: 46503546 Description: 99213 - WOUND CARE VISIT-LEV 3 EST PT Modifier: Quantity: 1 CPT4 Code: 56812751 Description: 11042 - DEB SUBQ TISSUE 20 SQ CM/< Modifier: Quantity: 1 CPT4 Code: Description: ICD-10 Diagnosis Description L97.822 Non-pressure chronic ulcer of other part of left lower leg with fat layer Modifier: exposed Quantity: Physician Procedures CPT4 Code: 7001749 Description: WC PHYS LEVEL 3 o NEW PT Modifier: 25 Quantity: 1 CPT4 Code: Description: ICD-10 Diagnosis Description S81.812A Laceration without foreign body, left lower leg, initial encounter L97.822 Non-pressure chronic ulcer of other part of left lower leg with  fat layer I10 Essential (primary) hypertension Modifier: exposed Quantity: CPT4 Code: 1610960 Description: 11042 - WC PHYS SUBQ TISS 20 SQ CM Modifier: Quantity: 1 CPT4 Code: Description: ICD-10 Diagnosis Description L97.822 Non-pressure chronic ulcer of other part of left lower leg with fat layer Modifier: exposed Quantity: Electronic Signature(s) Signed: 11/08/2021 3:06:41 PM By: Lenda Kelp PA-C Entered By: Lenda Kelp on 11/08/2021 15:06:41

## 2021-11-08 NOTE — Progress Notes (Signed)
LYASIA, NORTH ([id number]) Visit Report for 11/08/2021 Allergy List Details Patient Name: [name], [name] Date of Service: 11/08/2021 12:30 PM Medical Record Number: [id number] Patient Account Number: [id number] Date of Birth/Sex: 01/07/1961 (61 y.o. F) Treating RN: Carlene Coria Primary Care Bernis Stecher: PATIENT, NO Other Clinician: Referring Razi Hickle: Daylene Katayama Treating Uzair Godley/Extender: Skipper Cliche in Treatment: 0 Allergies Active Allergies lisinopril Allergy Notes Electronic Signature(s) Signed: 11/08/2021 4:19:23 PM By: Carlene Coria RN Entered By: Carlene Coria on 11/08/2021 12:57:48 [name] ([id number]) -------------------------------------------------------------------------------- Arrival Information Details Patient Name: [name] Date of Service: 11/08/2021 12:30 PM Medical Record Number: [id number] Patient Account Number: [id number] Date of Birth/Sex: 1960/06/09 (61 y.o. F) Treating RN: Carlene Coria Primary Care Kimball Appleby: PATIENT, NO Other Clinician: Referring Trammell Bowden: Daylene Katayama Treating Trianna Lupien/Extender: Skipper Cliche in Treatment: 0 Visit Information Patient Arrived: Ambulatory Arrival Time: 12:48 Accompanied By: daughter Transfer Assistance: None Patient Identification Verified: Yes Secondary Verification Process Completed: Yes Patient Requires Transmission-Based Precautions: No Patient Has Alerts: No Electronic Signature(s) Signed: 11/08/2021 4:19:23 PM By: Carlene Coria RN Entered By: Carlene Coria on 11/08/2021 12:54:12 [name] ([id number]) -------------------------------------------------------------------------------- Clinic Level of Care Assessment Details Patient Name: [name], [name] Date of Service: 11/08/2021 12:30 PM Medical Record Number: [id number] Patient Account Number: [id number] Date of Birth/Sex: [birth year] (61 y.o. F) Treating RN: Carlene Coria Primary Care Adriane Guglielmo: PATIENT, NO Other  Clinician: Referring Rache Klimaszewski: Daylene Katayama Treating Jenai Scaletta/Extender: Skipper Cliche in Treatment: 0 Clinic Level of Care Assessment Items TOOL 1 Quantity Score X - Use when EandM and Procedure is performed on INITIAL visit 1 0 ASSESSMENTS - Nursing Assessment / Reassessment X - General Physical Exam (combine w/ comprehensive assessment (listed just below) when performed on new 1 20 pt. evals) X- 1 25 Comprehensive Assessment (HX, ROS, Risk Assessments, Wounds Hx, etc.) ASSESSMENTS - Wound and Skin Assessment / Reassessment []  - Dermatologic / Skin Assessment (not related to wound area) 0 ASSESSMENTS - Ostomy and/or Continence Assessment and Care []  - Incontinence Assessment and Management 0 []  - 0 Ostomy Care Assessment and Management (repouching, etc.) PROCESS - Coordination of Care X - Simple Patient / Family Education for ongoing care 1 15 []  - 0 Complex (extensive) Patient / Family Education for ongoing care X- 1 10 Staff obtains Programmer, systems, Records, Test Results / Process Orders []  - 0 Staff telephones HHA, Nursing Homes / Clarify orders / etc []  - 0 Routine Transfer to another Facility (non-emergent condition) []  - 0 Routine Hospital Admission (non-emergent condition) X- 1 15 New Admissions / Biomedical engineer / Ordering NPWT, Apligraf, etc. []  - 0 Emergency Hospital Admission (emergent condition) PROCESS - Special Needs []  - Pediatric / Minor Patient Management 0 []  - 0 Isolation Patient Management []  - 0 Hearing / Language / Visual special needs []  - 0 Assessment of Community assistance (transportation, D/C planning, etc.) []  - 0 Additional assistance / Altered mentation []  - 0 Support Surface(s) Assessment (bed, cushion, seat, etc.) INTERVENTIONS - Miscellaneous []  - External ear exam 0 []  - 0 Patient Transfer (multiple staff / Civil Service fast streamer / Similar devices) []  - 0 Simple Staple / Suture removal (25 or less) []  - 0 Complex Staple / Suture  removal (26 or more) []  - 0 Hypo/Hyperglycemic Management (do not check if billed separately) X- 1 15 Ankle / Brachial Index (ABI) - do not check if billed separately Has the patient been seen at the hospital within the last three years: Yes Total Score: 100 Level Of Care: New/Established -  Level 3 [name], [name] ([id number]) Electronic Signature(s) Signed: 11/08/2021 4:19:23 PM By: Yevonne Pax RN Entered By: Yevonne Pax on 11/08/2021 13:49:56 [name] ([id number]) -------------------------------------------------------------------------------- Encounter Discharge Information Details Patient Name: [name] Noe E. Date of Service: 11/08/2021 12:30 PM Medical Record Number: [id number] Patient Account Number: [id number] Date of Birth/Sex: March 21, 1960 (61 y.o. F) Treating RN: Yevonne Pax Primary Care Angelgabriel Willmore: PATIENT, NO Other Clinician: Referring Daanya Lanphier: Gala Lewandowsky Treating Anel Creighton/Extender: Rowan Blase in Treatment: 0 Encounter Discharge Information Items Post Procedure Vitals Discharge Condition: Stable Temperature (F): 98.3 Ambulatory Status: Ambulatory Pulse (bpm): 92 Discharge Destination: Home Respiratory Rate (breaths/min): 18 Transportation: Private Auto Blood Pressure (mmHg): 168/124 Accompanied By: daughter Schedule Follow-up Appointment: Yes Clinical Summary of Care: Electronic Signature(s) Signed: 11/08/2021 4:19:23 PM By: Yevonne Pax RN Entered By: Yevonne Pax on 11/08/2021 13:51:13 [name] ([id number]) -------------------------------------------------------------------------------- Lower Extremity Assessment Details Patient Name: [name], [name] Date of Service: 11/08/2021 12:30 PM Medical Record Number: [id number] Patient Account Number: [id number] Date of Birth/Sex: 1961-01-03 (61 y.o. F) Treating RN: Yevonne Pax Primary Care Judia Arnott: PATIENT, NO Other Clinician: Referring Ebin Palazzi: Gala Lewandowsky Treating  Phelan Schadt/Extender: Rowan Blase in Treatment: 0 Edema Assessment Assessed: [Left: No] [Right: No] Edema: [Left: Ye] [Right: s] Calf Left: Right: Point of Measurement: 36 cm From Medial Instep 33 cm Ankle Left: Right: Point of Measurement: 12 cm From Medial Instep 23 cm Knee To Floor Left: Right: From Medial Instep 50 cm Vascular Assessment Pulses: Dorsalis Pedis Palpable: [Left:Yes] Blood Pressure: Brachial: [Left:168] Ankle: [Left:Dorsalis Pedis: 160 0.95] Electronic Signature(s) Signed: 11/08/2021 4:19:23 PM By: Yevonne Pax RN Entered By: Yevonne Pax on 11/08/2021 13:20:22 Freehling, Genae EMarland Kitchen ([id number]) -------------------------------------------------------------------------------- Multi Wound Chart Details Patient Name: [name] Noe E. Date of Service: 11/08/2021 12:30 PM Medical Record Number: [id number] Patient Account Number: [id number] Date of Birth/Sex: October 18, 1960 (61 y.o. F) Treating RN: Yevonne Pax Primary Care Shakina Choy: PATIENT, NO Other Clinician: Referring Theoren Palka: Gala Lewandowsky Treating Bobbette Eakes/Extender: Rowan Blase in Treatment: 0 Vital Signs Height(in): 67 Pulse(bpm): 92 Weight(lbs): 169 Blood Pressure(mmHg): 168/124 Body Mass Index(BMI): 26.5 Temperature(F): 98.3 Respiratory Rate(breaths/min): 18 Photos: [N/A:N/A] Wound Location: Left, Proximal, Medial Lower Leg Left, Distal, Medial Lower Leg N/A Wounding Event: Trauma Trauma N/A Primary Etiology: Lesion Lesion N/A Comorbid History: Hypertension Hypertension N/A Date Acquired: 10/16/2021 10/16/2021 N/A Weeks of Treatment: 0 0 N/A Wound Status: Open Open N/A Wound Recurrence: No No N/A Measurements L x W x D (cm) 6x7.5x0.3 1.7x2x0.4 N/A Area (cm) : 35.343 2.67 N/A Volume (cm) : 10.603 1.068 N/A Classification: Full Thickness Without Exposed Full Thickness Without Exposed N/A Support Structures Support Structures Exudate Amount: Medium Medium N/A Exudate Type: Serosanguineous  Serosanguineous N/A Exudate Color: red, brown red, brown N/A Granulation Amount: Medium (34-66%) Small (1-33%) N/A Granulation Quality: Red, Pink Pink N/A Necrotic Amount: Medium (34-66%) Large (67-100%) N/A Exposed Structures: Fat Layer (Subcutaneous Tissue): Fat Layer (Subcutaneous Tissue): N/A Yes Yes Fascia: No Fascia: No Tendon: No Tendon: No Muscle: No Muscle: No Joint: No Joint: No Bone: No Bone: No Epithelialization: None None N/A Treatment Notes Electronic Signature(s) Signed: 11/08/2021 4:19:23 PM By: Yevonne Pax RN Entered By: Yevonne Pax on 11/08/2021 13:34:39 Madkins, [name] ([id number]) -------------------------------------------------------------------------------- Multi-Disciplinary Care Plan Details Patient Name: [name] Simmonds. Date of Service: 11/08/2021 12:30 PM Medical Record Number: [id number] Patient Account Number: [id number] Date of Birth/Sex: 07-Jul-1960 (61 y.o. F) Treating RN: Yevonne Pax Primary Care Jarmon Javid: PATIENT, NO Other Clinician: Referring Stasia Somero: Gala Lewandowsky Treating Naylee Frankowski/Extender: Rowan Blase in Treatment: 0 Active  Inactive Wound/Skin Impairment Nursing Diagnoses: Knowledge deficit related to ulceration/compromised skin integrity Goals: Patient/caregiver will verbalize understanding of skin care regimen Date Initiated: 11/08/2021 Target Resolution Date: 12/09/2021 Goal Status: Active Ulcer/skin breakdown will have a volume reduction of 30% by week 4 Date Initiated: 11/08/2021 Target Resolution Date: 12/09/2021 Goal Status: Active Ulcer/skin breakdown will have a volume reduction of 50% by week 8 Date Initiated: 11/08/2021 Target Resolution Date: 01/08/2022 Goal Status: Active Ulcer/skin breakdown will have a volume reduction of 80% by week 12 Date Initiated: 11/08/2021 Target Resolution Date: 02/08/2022 Goal Status: Active Ulcer/skin breakdown will heal within 14 weeks Date Initiated: 11/08/2021 Target  Resolution Date: 03/10/2022 Goal Status: Active Interventions: Assess patient/caregiver ability to obtain necessary supplies Assess patient/caregiver ability to perform ulcer/skin care regimen upon admission and as needed Assess ulceration(s) every visit Notes: Electronic Signature(s) Signed: 11/08/2021 4:19:23 PM By: Yevonne Pax RN Entered By: Yevonne Pax on 11/08/2021 13:34:11 [name], [name] ([id number]) -------------------------------------------------------------------------------- Pain Assessment Details Patient Name: [name] Noe E. Date of Service: 11/08/2021 12:30 PM Medical Record Number: [id number] Patient Account Number: [id number] Date of Birth/Sex: [birth year] (61 y.o. F) Treating RN: Yevonne Pax Primary Care Jeven Topper: PATIENT, NO Other Clinician: Referring Ricki Clack: Gala Lewandowsky Treating Derion Kreiter/Extender: Rowan Blase in Treatment: 0 Active Problems Location of Pain Severity and Description of Pain Patient Has Paino Yes Site Locations With Dressing Change: Yes Duration of the Pain. Constant / Intermittento Intermittent How Long Does it Lasto Hours: Minutes: 15 Rate the pain. Current Pain Level: 7 Worst Pain Level: 8 Least Pain Level: 0 Tolerable Pain Level: 5 Character of Pain Describe the Pain: Sharp, Shooting Pain Management and Medication Current Pain Management: Medication: Yes Cold Application: No Rest: Yes Massage: No Activity: No T.MorenoN.S.: No Heat Application: No Leg drop or elevation: No Is the Current Pain Management Adequate: Inadequate How does your wound impact your activities of daily livingo Sleep: No Bathing: No Appetite: No Relationship With Others: No Bladder Continence: No Emotions: No Bowel Continence: No Work: No Toileting: No Drive: No Dressing: No Hobbies: No Electronic Signature(s) Signed: 11/08/2021 4:19:23 PM By: Yevonne Pax RN Entered By: Yevonne Pax on 11/08/2021 12:56:48 [name], [name]  ([id number]) -------------------------------------------------------------------------------- Patient/Caregiver Education Details Patient Name: [name] Simmonds. Date of Service: 11/08/2021 12:30 PM Medical Record Number: [id number] Patient Account Number: [id number] Date of Birth/Gender: [birth year] (61 y.o. F) Treating RN: Yevonne Pax Primary Care Physician: PATIENT, NO Other Clinician: Referring Physician: Gala Lewandowsky Treating Physician/Extender: Rowan Blase in Treatment: 0 Education Assessment Education Provided To: Patient Education Topics Provided Wound/Skin Impairment: Methods: Explain/Verbal Responses: State content correctly Electronic Signature(s) Signed: 11/08/2021 4:19:23 PM By: Yevonne Pax RN Entered By: Yevonne Pax on 11/08/2021 13:50:21 [name], [name] ([id number]) -------------------------------------------------------------------------------- Wound Assessment Details Patient Name: [name], [name] Date of Service: 11/08/2021 12:30 PM Medical Record Number: [id number] Patient Account Number: [id number] Date of Birth/Sex: 10-Jun-1960 (61 y.o. F) Treating RN: Yevonne Pax Primary Care Janice Bodine: PATIENT, NO Other Clinician: Referring Coran Dipaola: Gala Lewandowsky Treating Rainna Nearhood/Extender: Rowan Blase in Treatment: 0 Wound Status Wound Number: 1 Primary Etiology: Lesion Wound Location: Left, Proximal, Medial Lower Leg Wound Status: Open Wounding Event: Trauma Comorbid History: Hypertension Date Acquired: 10/16/2021 Weeks Of Treatment: 0 Clustered Wound: No Photos Wound Measurements Length: (cm) 6 Width: (cm) 7.5 Depth: (cm) 0.3 Area: (cm) 35.343 Volume: (cm) 10.603 % Reduction in Area: % Reduction in Volume: Epithelialization: None Tunneling: No Undermining: No Wound Description Classification: Full Thickness Without Exposed Support Structu Exudate Amount: Medium Exudate Type: Serosanguineous Exudate Color:  red, brown res Foul Odor After  Cleansing: No Slough/Fibrino Yes Wound Bed Granulation Amount: Medium (34-66%) Exposed Structure Granulation Quality: Red, Pink Fascia Exposed: No Necrotic Amount: Medium (34-66%) Fat Layer (Subcutaneous Tissue) Exposed: Yes Necrotic Quality: Adherent Slough Tendon Exposed: No Muscle Exposed: No Joint Exposed: No Bone Exposed: No Electronic Signature(s) Signed: 11/08/2021 4:19:23 PM By: Carlene Coria RN Entered By: Carlene Coria on 11/08/2021 13:17:20 [name], [name] ([id number]) -------------------------------------------------------------------------------- Wound Assessment Details Patient Name: [name], [name] Date of Service: 11/08/2021 12:30 PM Medical Record Number: [id number] Patient Account Number: [id number] Date of Birth/Sex: [birth year] (61 y.o. F) Treating RN: Carlene Coria Primary Care Fernie Grimm: PATIENT, NO Other Clinician: Referring Damiana Berrian: Daylene Katayama Treating Zyaire Dumas/Extender: Skipper Cliche in Treatment: 0 Wound Status Wound Number: 2 Primary Etiology: Lesion Wound Location: Left, Distal, Medial Lower Leg Wound Status: Open Wounding Event: Trauma Comorbid History: Hypertension Date Acquired: 10/16/2021 Weeks Of Treatment: 0 Clustered Wound: No Photos Wound Measurements Length: (cm) 1.7 Width: (cm) 2 Depth: (cm) 0.4 Area: (cm) 2.67 Volume: (cm) 1.068 % Reduction in Area: % Reduction in Volume: Epithelialization: None Tunneling: No Undermining: No Wound Description Classification: Full Thickness Without Exposed Support Structu Exudate Amount: Medium Exudate Type: Serosanguineous Exudate Color: red, brown res Foul Odor After Cleansing: No Slough/Fibrino Yes Wound Bed Granulation Amount: Small (1-33%) Exposed Structure Granulation Quality: Pink Fascia Exposed: No Necrotic Amount: Large (67-100%) Fat Layer (Subcutaneous Tissue) Exposed: Yes Necrotic Quality: Adherent Slough Tendon Exposed: No Muscle Exposed: No Joint Exposed: No Bone  Exposed: No Electronic Signature(s) Signed: 11/08/2021 4:19:23 PM By: Carlene Coria RN Entered By: Carlene Coria on 11/08/2021 13:18:57 [name], [name] ([id number]) -------------------------------------------------------------------------------- Vitals Details Patient Name: [name] Bast E. Date of Service: 11/08/2021 12:30 PM Medical Record Number: [id number] Patient Account Number: [id number] Date of Birth/Sex: [birth year] (61 y.o. F) Treating RN: Carlene Coria Primary Care Tyneisha Hegeman: PATIENT, NO Other Clinician: Referring Keonna Raether: Daylene Katayama Treating Jj Enyeart/Extender: Skipper Cliche in Treatment: 0 Vital Signs Time Taken: 12:56 Temperature (F): 98.3 Height (in): 67 Pulse (bpm): 92 Source: Stated Respiratory Rate (breaths/min): 18 Weight (lbs): 169 Blood Pressure (mmHg): 168/124 Source: Stated Reference Range: 80 - 120 mg / dl Body Mass Index (BMI): 26.5 Electronic Signature(s) Signed: 11/08/2021 4:19:23 PM By: Carlene Coria RN Entered By: Carlene Coria on 11/08/2021 12:57:30

## 2021-11-08 NOTE — Progress Notes (Signed)
CHARLISA, CHAM ([id number]) Visit Report for 11/08/2021 Abuse Risk Screen Details Patient Name: NOHEA, KRAS. Date of Service: 11/08/2021 12:30 PM Medical Record Number: [id number] Patient Account Number: [id number] Date of Birth/Sex: 1960-04-01 (61 y.o. F) Treating RN: Yevonne Pax Primary Care Zenya Hickam: PATIENT, NO Other Clinician: Referring Etherine Mackowiak: Gala Lewandowsky Treating Britain Anagnos/Extender: Rowan Blase in Treatment: 0 Abuse Risk Screen Items Answer ABUSE RISK SCREEN: Has anyone close to you tried to hurt or harm you recentlyo No Do you feel uncomfortable with anyone in your familyo No Has anyone forced you do things that you didnot want to doo No Electronic Signature(s) Signed: 11/08/2021 4:19:23 PM By: Yevonne Pax RN Entered By: Yevonne Pax on 11/08/2021 13:00:29 Barhorst, Crystal E. ([id number]) -------------------------------------------------------------------------------- Activities of Daily Living Details Patient Name: Bissette, Summar E. Date of Service: 11/08/2021 12:30 PM Medical Record Number: [id number] Patient Account Number: [id number] Date of Birth/Sex: 03-30-1960 (61 y.o. F) Treating RN: Yevonne Pax Primary Care Shelie Lansing: PATIENT, NO Other Clinician: Referring Sarenity Ramaker: Gala Lewandowsky Treating Miyah Hampshire/Extender: Rowan Blase in Treatment: 0 Activities of Daily Living Items Answer Activities of Daily Living (Please select one for each item) Drive Automobile Completely Able Take Medications Completely Able Use Telephone Completely Able Care for Appearance Completely Able Use Toilet Completely Able Bath / Shower Completely Able Dress Self Completely Able Feed Self Completely Able Walk Completely Able Get In / Out Bed Completely Able Housework Completely Able Prepare Meals Completely Able Handle Money Completely Able Shop for Self Completely Able Electronic Signature(s) Signed: 11/08/2021 4:19:23 PM By: Yevonne Pax RN Entered By: Yevonne Pax on  11/08/2021 13:01:10 Suppa, Veronia Beets ([id number]) -------------------------------------------------------------------------------- Education Screening Details Patient Name: [name] Date of Service: 11/08/2021 12:30 PM Medical Record Number: [id number] Patient Account Number: [id number] Date of Birth/Sex: [birth year] (61 y.o. F) Treating RN: Yevonne Pax Primary Care Chrys Landgrebe: PATIENT, NO Other Clinician: Referring Encarnacion Bole: Gala Lewandowsky Treating Seymone Forlenza/Extender: Rowan Blase in Treatment: 0 Primary Learner Assessed: Patient Learning Preferences/Education Level/Primary Language Learning Preference: Explanation Highest Education Level: High School Preferred Language: English Cognitive Barrier Language Barrier: No Translator Needed: No Memory Deficit: No Emotional Barrier: No Cultural/Religious Beliefs Affecting Medical Care: No Physical Barrier Impaired Vision: Yes Glasses Knowledge/Comprehension Knowledge Level: Medium Comprehension Level: Medium Ability to understand written instructions: Medium Ability to understand verbal instructions: Medium Motivation Anxiety Level: Anxious Cooperation: Cooperative Education Importance: Acknowledges Need Interest in Health Problems: Asks Questions Perception: Coherent Willingness to Engage in Self-Management High Activities: Readiness to Engage in Self-Management High Activities: Electronic Signature(s) Signed: 11/08/2021 4:19:23 PM By: Yevonne Pax RN Entered By: Yevonne Pax on 11/08/2021 13:01:50 Mccurley, Veronia Beets ([id number]) -------------------------------------------------------------------------------- Fall Risk Assessment Details Patient Name: [name]. Date of Service: 11/08/2021 12:30 PM Medical Record Number: [id number] Patient Account Number: [id number] Date of Birth/Sex: [birth year] (61 y.o. F) Treating RN: Yevonne Pax Primary Care Arvetta Araque: PATIENT, NO Other Clinician: Referring Mihaela Fajardo: Gala Lewandowsky Treating Ygnacio Fecteau/Extender: Rowan Blase in Treatment: 0 Fall Risk Assessment Items Have you had 2 or more falls in the last 12 monthso 0 No Have you had any fall that resulted in injury in the last 12 monthso 0 No FALLS RISK SCREEN History of falling - immediate or within 3 months 0 No Secondary diagnosis (Do you have 2 or more medical diagnoseso) 0 No Ambulatory aid None/bed rest/wheelchair/nurse 0 No Crutches/cane/walker 0 No Furniture 0 No Intravenous therapy Access/Saline/Heparin Lock 0 No Gait/Transferring Normal/ bed rest/ wheelchair 0 No Weak (short steps with or without shuffle, stooped but able to  lift head while walking, may 0 No seek support from furniture) Impaired (short steps with shuffle, may have difficulty arising from chair, head down, impaired 0 No balance) Mental Status Oriented to own ability 0 No Electronic Signature(s) Signed: 11/08/2021 4:19:23 PM By: Yevonne Pax RN Entered By: Yevonne Pax on 11/08/2021 13:01:56 Laatsch, Veronia Beets ([id number]) -------------------------------------------------------------------------------- Foot Assessment Details Patient Name: [name] Date of Service: 11/08/2021 12:30 PM Medical Record Number: [id number] Patient Account Number: [id number] Date of Birth/Sex: 1960-07-20 (61 y.o. F) Treating RN: Yevonne Pax Primary Care Lowen Barringer: PATIENT, NO Other Clinician: Referring Saloni Lablanc: Gala Lewandowsky Treating Rosevelt Luu/Extender: Rowan Blase in Treatment: 0 Foot Assessment Items Site Locations + = Sensation present, - = Sensation absent, C = Callus, U = Ulcer R = Redness, W = Warmth, M = Maceration, PU = Pre-ulcerative lesion F = Fissure, S = Swelling, D = Dryness Assessment Right: Left: Other Deformity: No No Prior Foot Ulcer: No No Prior Amputation: No No Charcot Joint: No No Ambulatory Status: Ambulatory Without Help Gait: Steady Electronic Signature(s) Signed: 11/08/2021 4:19:23 PM By: Yevonne Pax RN Entered By: Yevonne Pax on 11/08/2021 13:15:48 Durfey, Veronia Beets ([id number]) -------------------------------------------------------------------------------- Nutrition Risk Screening Details Patient Name: Mccleod, Azalia E. Date of Service: 11/08/2021 12:30 PM Medical Record Number: [id number] Patient Account Number: [id number] Date of Birth/Sex: [birth year] (61 y.o. F) Treating RN: Yevonne Pax Primary Care Kelsen Celona: PATIENT, NO Other Clinician: Referring Cressie Betzler: Gala Lewandowsky Treating Anu Stagner/Extender: Rowan Blase in Treatment: 0 Height (in): 67 Weight (lbs): 169 Body Mass Index (BMI): 26.5 Nutrition Risk Screening Items Score Screening NUTRITION RISK SCREEN: I have an illness or condition that made me change the kind and/or amount of food I eat 0 No I eat fewer than two meals per day 0 No I eat few fruits and vegetables, or milk products 0 No I have three or more drinks of beer, liquor or wine almost every day 0 No I have tooth or mouth problems that make it hard for me to eat 0 No I don't always have enough money to buy the food I need 0 No I eat alone most of the time 0 No I take three or more different prescribed or over-the-counter drugs a day 1 Yes Without wanting to, I have lost or gained 10 pounds in the last six months 0 No I am not always physically able to shop, cook and/or feed myself 0 No Nutrition Protocols Good Risk Protocol 0 No interventions needed Moderate Risk Protocol High Risk Proctocol Risk Level: Good Risk Score: 1 Electronic Signature(s) Signed: 11/08/2021 4:19:23 PM By: Yevonne Pax RN Entered By: Yevonne Pax on 11/08/2021 13:02:13

## 2021-11-12 LAB — WOUND CULTURE

## 2021-11-12 LAB — SPECIMEN STATUS REPORT

## 2021-11-15 DIAGNOSIS — S81812A Laceration without foreign body, left lower leg, initial encounter: Secondary | ICD-10-CM | POA: Diagnosis not present

## 2021-11-16 NOTE — Progress Notes (Signed)
SAIYA, CRIST ([id number]) Visit Report for 11/15/2021 Arrival Information Details Patient Name: [name], [name] Date of Service: 11/15/2021 3:30 PM Medical Record Number: [id number] Patient Account Number: [id number] Date of Birth/Sex: 07-May-1960 (61 y.o. F) Treating RN: Fonnie Mu Primary Care Ramiro Pangilinan: PATIENT, NO Other Clinician: Referring Zyrion Coey: Gala Lewandowsky Treating Jeancarlo Leffler/Extender: Rowan Blase in Treatment: 1 Visit Information History Since Last Visit Added or deleted any medications: No Patient Arrived: Walker Any new allergies or adverse reactions: No Arrival Time: 13:56 Had a fall or experienced change in No Accompanied By: daughter activities of daily living that may affect Transfer Assistance: None risk of falls: Patient Identification Verified: Yes Signs or symptoms of abuse/neglect since last visito No Secondary Verification Process Completed: Yes Hospitalized since last visit: No Patient Requires Transmission-Based Precautions: No Implantable device outside of the clinic excluding No Patient Has Alerts: No cellular tissue based products placed in the center since last visit: Has Dressing in Place as Prescribed: Yes Has Compression in Place as Prescribed: Yes Pain Present Now: Yes Electronic Signature(s) Signed: 11/15/2021 4:20:12 PM By: Fonnie Mu RN Entered By: Fonnie Mu on 11/15/2021 13:59:59 Melling, Tenecia E. ([id number]) -------------------------------------------------------------------------------- Compression Therapy Details Patient Name: [name] Noe E. Date of Service: 11/15/2021 3:30 PM Medical Record Number: [id number] Patient Account Number: [id number] Date of Birth/Sex: 02-17-1961 (61 y.o. F) Treating RN: Fonnie Mu Primary Care Aryanne Gilleland: PATIENT, NO Other Clinician: Referring Khris Jansson: Gala Lewandowsky Treating Izzabelle Bouley/Extender: Rowan Blase in Treatment: 1 Compression Therapy Performed for Wound  Assessment: Wound #1 Left,Proximal,Medial Lower Leg Performed By: Clinician Fonnie Mu, RN Compression Type: Three Layer Electronic Signature(s) Signed: 11/15/2021 4:20:12 PM By: Fonnie Mu RN Entered By: Fonnie Mu on 11/15/2021 14:00:30 Shimizu, Veronia Beets ([id number]) -------------------------------------------------------------------------------- Compression Therapy Details Patient Name: [name] Noe E. Date of Service: 11/15/2021 3:30 PM Medical Record Number: [id number] Patient Account Number: [id number] Date of Birth/Sex: [birth year] (61 y.o. F) Treating RN: Fonnie Mu Primary Care Modestine Scherzinger: PATIENT, NO Other Clinician: Referring Adanely Reynoso: Gala Lewandowsky Treating Sameen Leas/Extender: Rowan Blase in Treatment: 1 Compression Therapy Performed for Wound Assessment: Wound #2 Left,Distal,Medial Lower Leg Performed By: Clinician Fonnie Mu, RN Compression Type: Three Layer Electronic Signature(s) Signed: 11/15/2021 4:20:12 PM By: Fonnie Mu RN Entered By: Fonnie Mu on 11/15/2021 14:00:30 Gomes, Veronia Beets ([id number]) -------------------------------------------------------------------------------- Encounter Discharge Information Details Patient Name: [name] Date of Service: 11/15/2021 3:30 PM Medical Record Number: [id number] Patient Account Number: [id number] Date of Birth/Sex: 1960/04/04 (61 y.o. F) Treating RN: Fonnie Mu Primary Care Emilyn Ruble: PATIENT, NO Other Clinician: Referring Latonya Knight: Gala Lewandowsky Treating Lilibeth Opie/Extender: Rowan Blase in Treatment: 1 Encounter Discharge Information Items Discharge Condition: Stable Ambulatory Status: Walker Discharge Destination: Home Transportation: Private Auto Accompanied By: daughter Schedule Follow-up Appointment: Yes Clinical Summary of Care: Patient Declined Electronic Signature(s) Signed: 11/15/2021 4:20:12 PM By: Fonnie Mu RN Entered By:  Fonnie Mu on 11/15/2021 14:01:09 Devos, Fabiha E. ([id number]) -------------------------------------------------------------------------------- Pain Assessment Details Patient Name: [name] Noe E. Date of Service: 11/15/2021 3:30 PM Medical Record Number: [id number] Patient Account Number: [id number] Date of Birth/Sex: February 16, 1961 (61 y.o. F) Treating RN: Fonnie Mu Primary Care Pricilla Moehle: PATIENT, NO Other Clinician: Referring Hira Trent: Gala Lewandowsky Treating Galia Rahm/Extender: Rowan Blase in Treatment: 1 Active Problems Location of Pain Severity and Description of Pain Patient Has Paino Yes Site Locations Pain Location: Generalized Pain, Pain in Ulcers With Dressing Change: Yes Duration of the Pain. Constant / Intermittento Constant Rate the pain. Current Pain Level: 8 Worst Pain Level: 10 Least Pain Level: 0 Tolerable Pain  Level: 8 Character of Pain Describe the Pain: Aching Pain Management and Medication Current Pain Management: Medication: No Cold Application: No Rest: No Massage: No Activity: No T.MorenoN.S.: No Heat Application: No Leg drop or elevation: No Is the Current Pain Management Adequate: Adequate How does your wound impact your activities of daily livingo Sleep: No Bathing: No Appetite: No Relationship With Others: No Bladder Continence: No Emotions: No Bowel Continence: No Work: No Toileting: No Drive: No Dressing: No Hobbies: No Electronic Signature(s) Signed: 11/15/2021 4:20:12 PM By: Fonnie Mu RN Entered By: Fonnie Mu on 11/15/2021 14:29:47 Pridmore, Deslyn EMarland Kitchen ([id number]) -------------------------------------------------------------------------------- Wound Assessment Details Patient Name: [name] Noe E. Date of Service: 11/15/2021 3:30 PM Medical Record Number: [id number] Patient Account Number: [id number] Date of Birth/Sex: [birth year] (61 y.o. F) Treating RN: Fonnie Mu Primary Care Laurynn Mccorvey:  PATIENT, NO Other Clinician: Referring Meris Reede: Gala Lewandowsky Treating Tanika Bracco/Extender: Rowan Blase in Treatment: 1 Wound Status Wound Number: 1 Primary Etiology: Lesion Wound Location: Left, Proximal, Medial Lower Leg Wound Status: Open Wounding Event: Trauma Date Acquired: 09/15/2021 Weeks Of Treatment: 1 Clustered Wound: No Wound Measurements Length: (cm) 6 Width: (cm) 7.5 Depth: (cm) 0.3 Area: (cm) 35.343 Volume: (cm) 10.603 % Reduction in Area: 0% % Reduction in Volume: 0% Wound Description Classification: Full Thickness Without Exposed Support Structu Exudate Amount: Medium Exudate Type: Serosanguineous Exudate Color: red, brown res Treatment Notes Wound #1 (Lower Leg) Wound Laterality: Left, Medial, Proximal Cleanser Soap and Water Discharge Instruction: Gently cleanse wound with antibacterial soap, rinse and pat dry prior to dressing wounds Wound Cleanser Discharge Instruction: Wash your hands with soap and water. Remove old dressing, discard into plastic bag and place into trash. Cleanse the wound with Wound Cleanser prior to applying a clean dressing using gauze sponges, not tissues or cotton balls. Do not scrub or use excessive force. Pat dry using gauze sponges, not tissue or cotton balls. Peri-Wound Care Topical Primary Dressing Hydrofera Blue Ready Transfer Foam, 2.5x2.5 (in/in) Discharge Instruction: Apply Hydrofera Blue Ready to wound bed as directed Secondary Dressing ABD Pad 5x9 (in/in) Discharge Instruction: Cover with ABD pad Gauze Secured With Compression Wrap 3-LAYER WRAP - Profore Lite LF 3 Multilayer Compression Bandaging System Discharge Instruction: Apply 3 multi-layer wrap as prescribed. Compression Stockings Add-Ons ROBBI, SPELLS ([id number]) Electronic Signature(s) Signed: 11/15/2021 4:20:12 PM By: Fonnie Mu RN Entered By: Fonnie Mu on 11/15/2021 14:00:13 Lafavor, Veronia Beets  ([id number]) -------------------------------------------------------------------------------- Wound Assessment Details Patient Name: [name] Noe E. Date of Service: 11/15/2021 3:30 PM Medical Record Number: [id number] Patient Account Number: [id number] Date of Birth/Sex: [birth year] (61 y.o. F) Treating RN: Fonnie Mu Primary Care Alexsis Kathman: PATIENT, NO Other Clinician: Referring Ellise Kovack: Gala Lewandowsky Treating Daylin Eads/Extender: Rowan Blase in Treatment: 1 Wound Status Wound Number: 2 Primary Etiology: Lesion Wound Location: Left, Distal, Medial Lower Leg Wound Status: Open Wounding Event: Trauma Date Acquired: 09/15/2021 Weeks Of Treatment: 1 Clustered Wound: No Wound Measurements Length: (cm) 1.7 Width: (cm) 2 Depth: (cm) 0.4 Area: (cm) 2.67 Volume: (cm) 1.068 % Reduction in Area: 0% % Reduction in Volume: 0% Wound Description Classification: Full Thickness Without Exposed Support Structu Exudate Amount: Medium Exudate Type: Serosanguineous Exudate Color: red, brown res Treatment Notes Wound #2 (Lower Leg) Wound Laterality: Left, Medial, Distal Cleanser Soap and Water Discharge Instruction: Gently cleanse wound with antibacterial soap, rinse and pat dry prior to dressing wounds Wound Cleanser Discharge Instruction: Wash your hands with soap and water. Remove old dressing, discard into plastic bag and place into trash. Cleanse the wound  with Wound Cleanser prior to applying a clean dressing using gauze sponges, not tissues or cotton balls. Do not scrub or use excessive force. Pat dry using gauze sponges, not tissue or cotton balls. Peri-Wound Care Topical Primary Dressing Hydrofera Blue Ready Transfer Foam, 2.5x2.5 (in/in) Discharge Instruction: Apply Hydrofera Blue Ready to wound bed as directed Secondary Dressing ABD Pad 5x9 (in/in) Discharge Instruction: Cover with ABD pad Gauze Secured With Compression Wrap 3-LAYER WRAP - Profore Lite LF 3  Multilayer Compression Bandaging System Discharge Instruction: Apply 3 multi-layer wrap as prescribed. Compression Stockings Add-Ons LULAR, ELIE ([id number]) Electronic Signature(s) Signed: 11/15/2021 4:20:12 PM By: Rhae Hammock RN Entered By: Rhae Hammock on 11/15/2021 14:00:13

## 2021-11-22 ENCOUNTER — Telehealth: Payer: Self-pay | Admitting: Podiatry

## 2021-11-22 ENCOUNTER — Ambulatory Visit: Payer: Medicaid Other | Admitting: Physician Assistant

## 2021-11-22 NOTE — Telephone Encounter (Signed)
Patient called and stated that she was not scheduled for a follow up for a bandage change and wanted to know when she should come back.

## 2021-11-23 NOTE — Telephone Encounter (Signed)
Please have patient follow-up with me around 12/03/2021.  Thanks, Dr. Logan Bores

## 2021-11-29 ENCOUNTER — Ambulatory Visit: Payer: Medicaid Other | Admitting: Physician Assistant

## 2021-12-10 ENCOUNTER — Encounter: Payer: Medicaid Other | Attending: Physician Assistant | Admitting: Physician Assistant

## 2021-12-10 DIAGNOSIS — I10 Essential (primary) hypertension: Secondary | ICD-10-CM | POA: Diagnosis not present

## 2021-12-10 DIAGNOSIS — X58XXXA Exposure to other specified factors, initial encounter: Secondary | ICD-10-CM | POA: Insufficient documentation

## 2021-12-10 DIAGNOSIS — L97822 Non-pressure chronic ulcer of other part of left lower leg with fat layer exposed: Secondary | ICD-10-CM | POA: Diagnosis not present

## 2021-12-10 DIAGNOSIS — S81812A Laceration without foreign body, left lower leg, initial encounter: Secondary | ICD-10-CM | POA: Insufficient documentation

## 2021-12-10 NOTE — Progress Notes (Addendum)
[name], [name] ([id number]) Visit Report for 12/10/2021 Arrival Information Details Patient Name: [name], [name] Date of Service: 12/10/2021 10:15 AM Medical Record Number: [id number] Patient Account Number: [id number] Date of Birth/Sex: Jul 16, 1960 (61 y.o. F) Treating RN: Yevonne PaxEpps, Carrie Primary Care Ira Busbin: PATIENT, NO Other Clinician: Referring Jamion Carter: Gala LewandowskyEvans, Brent Treating Jayliah Benett/Extender: Rowan BlaseStone, Hoyt Weeks in Treatment: 4 Visit Information History Since Last Visit All ordered tests and consults were completed: No Patient Arrived: Ambulatory Added or deleted any medications: No Arrival Time: 10:29 Any new allergies or adverse reactions: No Accompanied By: caregiver Had a fall or experienced change in No Transfer Assistance: None activities of daily living that may affect Patient Identification Verified: Yes risk of falls: Secondary Verification Process Completed: Yes Signs or symptoms of abuse/neglect since last visito No Patient Requires Transmission-Based Precautions: No Hospitalized since last visit: No Patient Has Alerts: No Implantable device outside of the clinic excluding No cellular tissue based products placed in the center since last visit: Has Dressing in Place as Prescribed: Yes Has Footwear/Offloading in Place as Prescribed: Yes Left: Other:cam walker boot Pain Present Now: No Electronic Signature(s) Signed: 12/10/2021 11:54:39 AM By: Yevonne PaxEpps, Carrie RN Entered By: Yevonne PaxEpps, Carrie on 12/10/2021 11:54:38 [name], [name] ([id number]) -------------------------------------------------------------------------------- Clinic Level of Care Assessment Details Patient Name: [name], Ameerah E. Date of Service: 12/10/2021 10:15 AM Medical Record Number: [id number] Patient Account Number: [id number] Date of Birth/Sex: Jul 16, 1960 (61 y.o. F) Treating RN: Yevonne PaxEpps, Carrie Primary Care Parisha Beaulac: PATIENT, NO Other Clinician: Referring Daegan Arizmendi: Gala LewandowskyEvans, Brent Treating  Azariyah Luhrs/Extender: Rowan BlaseStone, Hoyt Weeks in Treatment: 4 Clinic Level of Care Assessment Items TOOL 4 Quantity Score X - Use when only an EandM is performed on FOLLOW-UP visit 1 0 ASSESSMENTS - Nursing Assessment / Reassessment X - Reassessment of Co-morbidities (includes updates in patient status) 1 10 X- 1 5 Reassessment of Adherence to Treatment Plan ASSESSMENTS - Wound and Skin Assessment / Reassessment X - Simple Wound Assessment / Reassessment - one wound 1 5 []  - 0 Complex Wound Assessment / Reassessment - multiple wounds []  - 0 Dermatologic / Skin Assessment (not related to wound area) ASSESSMENTS - Focused Assessment []  - Circumferential Edema Measurements - multi extremities 0 []  - 0 Nutritional Assessment / Counseling / Intervention []  - 0 Lower Extremity Assessment (monofilament, tuning fork, pulses) []  - 0 Peripheral Arterial Disease Assessment (using hand held doppler) ASSESSMENTS - Ostomy and/or Continence Assessment and Care []  - Incontinence Assessment and Management 0 []  - 0 Ostomy Care Assessment and Management (repouching, etc.) PROCESS - Coordination of Care X - Simple Patient / Family Education for ongoing care 1 15 []  - 0 Complex (extensive) Patient / Family Education for ongoing care []  - 0 Staff obtains ChiropractorConsents, Records, Test Results / Process Orders []  - 0 Staff telephones HHA, Nursing Homes / Clarify orders / etc []  - 0 Routine Transfer to another Facility (non-emergent condition) []  - 0 Routine Hospital Admission (non-emergent condition) []  - 0 New Admissions / Manufacturing engineernsurance Authorizations / Ordering NPWT, Apligraf, etc. []  - 0 Emergency Hospital Admission (emergent condition) X- 1 10 Simple Discharge Coordination []  - 0 Complex (extensive) Discharge Coordination PROCESS - Special Needs []  - Pediatric / Minor Patient Management 0 []  - 0 Isolation Patient Management []  - 0 Hearing / Language / Visual special needs []  - 0 Assessment of  Community assistance (transportation, D/C planning, etc.) []  - 0 Additional assistance / Altered mentation []  - 0 Support Surface(s) Assessment (bed, cushion, seat, etc.) INTERVENTIONS - Wound Cleansing / Measurement  [name], [name] ([id number]) []  - 0 Simple Wound Cleansing - one wound X- 2 5 Complex Wound Cleansing - multiple wounds X- 1 5 Wound Imaging (photographs - any number of wounds) []  - 0 Wound Tracing (instead of photographs) []  - 0 Simple Wound Measurement - one wound X- 2 5 Complex Wound Measurement - multiple wounds INTERVENTIONS - Wound Dressings X - Small Wound Dressing one or multiple wounds 2 10 []  - 0 Medium Wound Dressing one or multiple wounds []  - 0 Large Wound Dressing one or multiple wounds X- 1 5 Application of Medications - topical []  - 0 Application of Medications - injection INTERVENTIONS - Miscellaneous []  - External ear exam 0 []  - 0 Specimen Collection (cultures, biopsies, blood, body fluids, etc.) []  - 0 Specimen(s) / Culture(s) sent or taken to Lab for analysis []  - 0 Patient Transfer (multiple staff / / Similar devices) []  - 0 Simple Staple / Suture removal (25 or less) []  - 0 Complex Staple / Suture removal (26 or more) []  - 0 Hypo / Hyperglycemic Management (close monitor of Blood Glucose) []  - 0 Ankle / Brachial Index (ABI) - do not check if billed separately X- 1 5 Vital Signs Has the patient been seen at the hospital within the last three years: Yes Total Score: 100 Level Of Care: New/Established - Level 3 Electronic Signature(s) Signed: 12/10/2021 4:20:15 PM By: RN Entered By: on 12/10/2021 11:13:02 Reierson, ( ) -------------------------------------------------------------------------------- Encounter Discharge Information Details Patient Name: E. Date of Service: 12/10/2021 10:15 AM Medical Record Number: Patient Account Number:  Nurse, adult Date of Birth/Sex: 14-Jul-1960 (61 y.o. F) Treating RN: Primary Care Abdulkarim Eberlin: PATIENT, NO Other Clinician: Referring Juneau Doughman: Treating Kriste Broman/Extender: 12/12/2021 in Treatment: 4 Encounter Discharge Information Items Discharge Condition: Stable Ambulatory Status: Walker Discharge Destination: Home Transportation: Private Auto Accompanied By: caregiver Schedule Follow-up Appointment: Yes Clinical Summary of Care: Electronic Signature(s) Signed: 12/10/2021 4:20:15 PM By: Yevonne Pax RN Entered By: 12/12/2021 on 12/10/2021 11:13:55 [place]sma, [id number] (Lindell Noe) -------------------------------------------------------------------------------- Lower Extremity Assessment Details Patient Name: Guilmette, Jlee E. Date of Service: 12/10/2021 10:15 AM Medical Record Number: [id number] Patient Account Number: [id number] Date of Birth/Sex: 11/11/1960 (61 y.o. F) Treating RN: Yevonne Pax Primary Care Khyree Carillo: PATIENT, NO Other Clinician: Referring Berdena Cisek: Gala Lewandowsky Treating Ronika Kelson/Extender: Rowan Blase in Treatment: 4 Edema Assessment Assessed: [Left: No] [Right: No] Edema: [Left: Ye] [Right: s] Calf Left: Right: Point of Measurement: 3 cm From Medial Instep 32 cm Ankle Left: Right: Point of Measurement: 12 cm From Medial Instep 22 cm Vascular Assessment Pulses: Dorsalis Pedis Palpable: [Left:Yes] Electronic Signature(s) Signed: 12/10/2021 4:20:15 PM By: Yevonne Pax RN Entered By: Yevonne Pax on 12/10/2021 10:36:56 [name], [name] ([id number]) -------------------------------------------------------------------------------- Multi Wound Chart Details Patient Name: 12/12/2021 E. Date of Service: 12/10/2021 10:15 AM Medical Record Number: [id number] Patient Account Number: 09/30/1960 Date of Birth/Sex: 1960/04/22 (61 y.o. F) Treating RN: Gala Lewandowsky Primary Care Ligaya Cormier: PATIENT, NO Other Clinician: Referring  Nikia Mangino: Rowan Blase Treating Mortimer Bair/Extender: 12/12/2021 in Treatment: 4 Vital Signs Height(in): 67 Pulse(bpm): 73 Weight(lbs): 169 Blood Pressure(mmHg): 148/93 Body Mass Index(BMI): 26.5 Temperature(F): 98.2 Respiratory Rate(breaths/min): 18 Photos: [N/A:N/A] Wound Location: Left, Proximal, Medial Lower Leg Left, Distal, Medial Lower Leg N/A Wounding Event: Trauma Trauma N/A Primary Etiology: Lesion Lesion N/A Comorbid History: Hypertension Hypertension N/A Date Acquired: 09/15/2021 09/15/2021 N/A Weeks of Treatment: 4 4 N/A Wound Status: Open Open N/A Wound  Recurrence: No No N/A Measurements L x W x D (cm) 4.5x3.5x0.1 1.5x1x0.4 N/A Area (cm) : 12.37 1.178 N/A Volume (cm) : 1.237 0.471 N/A % Reduction in Area: 65.00% 55.90% N/A % Reduction in Volume: 88.30% 55.90% N/A Classification: Full Thickness Without Exposed Full Thickness Without Exposed N/A Support Structures Support Structures Exudate Amount: Medium Medium N/A Exudate Type: Serosanguineous Serosanguineous N/A Exudate Color: red, brown red, brown N/A Granulation Amount: Medium (34-66%) Medium (34-66%) N/A Necrotic Amount: Medium (34-66%) Medium (34-66%) N/A Exposed Structures: Fat Layer (Subcutaneous Tissue): Fat Layer (Subcutaneous Tissue): N/A Yes Yes Fascia: No Tendon: No Muscle: No Joint: No Bone: No Epithelialization: Small (1-33%) N/A N/A Treatment Notes Electronic Signature(s) Signed: 12/10/2021 4:20:15 PM By: Carlene Coria RN Entered By: Carlene Coria on 12/10/2021 10:37:41 [name], [name] ([id number]) -------------------------------------------------------------------------------- [place] Details Patient Name: [name] Campbell. Date of Service: 12/10/2021 10:15 AM Medical Record Number: [id number] Patient Account Number: [id number] Date of Birth/Sex: 10/21/1960 (61 y.o. F) Treating RN: Carlene Coria Primary Care Percy Winterrowd: PATIENT, NO Other Clinician: Referring  Khylan Sawyer: Daylene Katayama Treating Diyana Starrett/Extender: Skipper Cliche in Treatment: 4 Active Inactive Wound/Skin Impairment Nursing Diagnoses: Knowledge deficit related to ulceration/compromised skin integrity Goals: Patient/caregiver will verbalize understanding of skin care regimen Date Initiated: 11/08/2021 Target Resolution Date: 12/09/2021 Goal Status: Active Ulcer/skin breakdown will have a volume reduction of 30% by week 4 Date Initiated: 11/08/2021 Target Resolution Date: 12/09/2021 Goal Status: Active Ulcer/skin breakdown will have a volume reduction of 50% by week 8 Date Initiated: 11/08/2021 Target Resolution Date: 01/08/2022 Goal Status: Active Ulcer/skin breakdown will have a volume reduction of 80% by week 12 Date Initiated: 11/08/2021 Target Resolution Date: 02/08/2022 Goal Status: Active Ulcer/skin breakdown will heal within 14 weeks Date Initiated: 11/08/2021 Target Resolution Date: 03/10/2022 Goal Status: Active Interventions: Assess patient/caregiver ability to obtain necessary supplies Assess patient/caregiver ability to perform ulcer/skin care regimen upon admission and as needed Assess ulceration(s) every visit Notes: Electronic Signature(s) Signed: 12/10/2021 4:20:15 PM By: Carlene Coria RN Entered By: Carlene Coria on 12/10/2021 10:37:00 [name], [name] ([id number]) -------------------------------------------------------------------------------- Pain Assessment Details Patient Name: Dennis Bast E. Date of Service: 12/10/2021 10:15 AM Medical Record Number: [id number] Patient Account Number: [id number] Date of Birth/Sex: 01/31/1961 (61 y.o. F) Treating RN: Carlene Coria Primary Care Burlin Mcnair: PATIENT, NO Other Clinician: Referring Charlsey Moragne: Daylene Katayama Treating Yesenia Locurto/Extender: Skipper Cliche in Treatment: 4 Active Problems Location of Pain Severity and Description of Pain Patient Has Paino No Site Locations Pain Management and Medication Current  Pain Management: Electronic Signature(s) Signed: 12/10/2021 4:20:15 PM By: Carlene Coria RN Entered By: Carlene Coria on 12/10/2021 10:31:07 [name], [name] ([id number]) -------------------------------------------------------------------------------- Patient/Caregiver Education Details Patient Name: [name] Campbell. Date of Service: 12/10/2021 10:15 AM Medical Record Number: [id number] Patient Account Number: [id number] Date of Birth/Gender: [birth year] (61 y.o. F) Treating RN: Carlene Coria Primary Care Physician: PATIENT, NO Other Clinician: Referring Physician: Daylene Katayama Treating Physician/Extender: Skipper Cliche in Treatment: 4 Education Assessment Education Provided To: Patient Education Topics Provided Wound/Skin Impairment: Methods: Explain/Verbal Responses: State content correctly Electronic Signature(s) Signed: 12/10/2021 4:20:15 PM By: Carlene Coria RN Entered By: Carlene Coria on 12/10/2021 11:13:15 [name], [name] ([id number]) -------------------------------------------------------------------------------- Wound Assessment Details Patient Name: [name], [name] Date of Service: 12/10/2021 10:15 AM Medical Record Number: [id number] Patient Account Number: [id number] Date of Birth/Sex: Dec 21, 1960 (61 y.o. F) Treating RN: Carlene Coria Primary Care Vedha Tercero: PATIENT, NO Other Clinician: Referring Kynesha Guerin: Daylene Katayama Treating Nayquan Evinger/Extender: Skipper Cliche in Treatment: 4 Wound Status Wound Number: 1  Primary Etiology: Lesion Wound Location: Left, Proximal, Medial Lower Leg Wound Status: Open Wounding Event: Trauma Comorbid History: Hypertension Date Acquired: 09/15/2021 Weeks Of Treatment: 4 Clustered Wound: No Photos Wound Measurements Length: (cm) 4.5 Width: (cm) 3.5 Depth: (cm) 0.1 Area: (cm) 12.37 Volume: (cm) 1.237 % Reduction in Area: 65% % Reduction in Volume: 88.3% Epithelialization: Small (1-33%) Tunneling: No Undermining: No Wound  Description Classification: Full Thickness Without Exposed Support Structu Exudate Amount: Medium Exudate Type: Serosanguineous Exudate Color: red, brown res Wound Bed Granulation Amount: Medium (34-66%) Exposed Structure Necrotic Amount: Medium (34-66%) Fat Layer (Subcutaneous Tissue) Exposed: Yes Treatment Notes Wound #1 (Lower Leg) Wound Laterality: Left, Medial, Proximal Cleanser Soap and Water Discharge Instruction: black african soap per patient request Wound Cleanser Discharge Instruction: Wash your hands with soap and water. Remove old dressing, discard into plastic bag and place into trash. Cleanse the wound with Wound Cleanser prior to applying a clean dressing using gauze sponges, not tissues or cotton balls. Do not scrub or use excessive force. Pat dry using gauze sponges, not tissue or cotton balls. Peri-Wound Care AandD Ointment Hallquist, Kemesha E. ([id number]) Discharge Instruction: Apply AandD Ointment as directed Topical Primary Dressing Telfa Non-adherent Dressing, 3X8 (in/in) Discharge Instruction: Add to wound bed to alleviate sticking. Secondary Dressing Secured With Tubigrip Size C, 2.75x10 (in/yd) Discharge Instruction: Apply 3 Tubigrip C 3-finger-widths below knee to base of toes to secure dressing and/or for swelling. Compression Wrap Compression Stockings Add-Ons Electronic Signature(s) Signed: 12/10/2021 4:20:15 PM By: Yevonne Pax RN Entered By: Yevonne Pax on 12/10/2021 10:35:12 Rinkenberger, [name] ([id number]) -------------------------------------------------------------------------------- Wound Assessment Details Patient Name: Spinner, Basil E. Date of Service: 12/10/2021 10:15 AM Medical Record Number: [id number] Patient Account Number: [id number] Date of Birth/Sex: 10/19/1960 (61 y.o. F) Treating RN: Yevonne Pax Primary Care Israel Werts: PATIENT, NO Other Clinician: Referring Devrin Monforte: Gala Lewandowsky Treating Kelvon Giannini/Extender: Rowan Blase in  Treatment: 4 Wound Status Wound Number: 2 Primary Etiology: Lesion Wound Location: Left, Distal, Medial Lower Leg Wound Status: Open Wounding Event: Trauma Comorbid History: Hypertension Date Acquired: 09/15/2021 Weeks Of Treatment: 4 Clustered Wound: No Photos Wound Measurements Length: (cm) 1.5 Width: (cm) 1 Depth: (cm) 0.4 Area: (cm) 1.178 Volume: (cm) 0.471 % Reduction in Area: 55.9% % Reduction in Volume: 55.9% Tunneling: No Undermining: No Wound Description Classification: Full Thickness Without Exposed Support Structu Exudate Amount: Medium Exudate Type: Serosanguineous Exudate Color: red, brown res Wound Bed Granulation Amount: Medium (34-66%) Exposed Structure Necrotic Amount: Medium (34-66%) Fascia Exposed: No Necrotic Quality: Adherent Slough Fat Layer (Subcutaneous Tissue) Exposed: Yes Tendon Exposed: No Muscle Exposed: No Joint Exposed: No Bone Exposed: No Treatment Notes Wound #2 (Lower Leg) Wound Laterality: Left, Medial, Distal Cleanser Soap and Water Discharge Instruction: black african soap per patient request Wound Cleanser Discharge Instruction: Wash your hands with soap and water. Remove old dressing, discard into plastic bag and place into trash. Cleanse the wound with Wound Cleanser prior to applying a clean dressing using gauze sponges, not tissues or cotton balls. Do not Korson, Shawniece E. ([id number]) scrub or use excessive force. Pat dry using gauze sponges, not tissue or cotton balls. Peri-Wound Care AandD Ointment Discharge Instruction: Apply AandD Ointment as directed Topical Primary Dressing Telfa Non-adherent Dressing, 3X8 (in/in) Discharge Instruction: Add to wound bed to alleviate sticking. Secondary Dressing Secured With Tubigrip Size C, 2.75x10 (in/yd) Discharge Instruction: Apply 3 Tubigrip C 3-finger-widths below knee to base of toes to secure dressing and/or for swelling. Compression Wrap Compression  Stockings Add-Ons Electronic Signature(s) Signed: 12/10/2021  4:20:15 PM By: Yevonne Pax RN Entered By: Yevonne Pax on 12/10/2021 10:36:14 Fennelly, [name] ([id number]) -------------------------------------------------------------------------------- Vitals Details Patient Name: [name] Date of Service: 12/10/2021 10:15 AM Medical Record Number: [id number] Patient Account Number: [id number] Date of Birth/Sex: Oct 28, 1960 (61 y.o. F) Treating RN: Yevonne Pax Primary Care Rayah Fines: PATIENT, NO Other Clinician: Referring Bernadett Milian: Gala Lewandowsky Treating Arcenia Scarbro/Extender: Rowan Blase in Treatment: 4 Vital Signs Time Taken: 10:30 Temperature (F): 98.2 Height (in): 67 Pulse (bpm): 73 Weight (lbs): 169 Respiratory Rate (breaths/min): 18 Body Mass Index (BMI): 26.5 Blood Pressure (mmHg): 148/93 Reference Range: 80 - 120 mg / dl Electronic Signature(s) Signed: 12/10/2021 4:20:15 PM By: Yevonne Pax RN Entered By: Yevonne Pax on 12/10/2021 10:30:58
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
## 2021-12-17 ENCOUNTER — Encounter: Payer: Medicaid Other | Attending: Physician Assistant | Admitting: Physician Assistant

## 2021-12-17 DIAGNOSIS — I872 Venous insufficiency (chronic) (peripheral): Secondary | ICD-10-CM | POA: Insufficient documentation

## 2021-12-17 DIAGNOSIS — L97822 Non-pressure chronic ulcer of other part of left lower leg with fat layer exposed: Secondary | ICD-10-CM | POA: Diagnosis not present

## 2021-12-17 DIAGNOSIS — I10 Essential (primary) hypertension: Secondary | ICD-10-CM | POA: Diagnosis not present

## 2021-12-17 DIAGNOSIS — S81812A Laceration without foreign body, left lower leg, initial encounter: Secondary | ICD-10-CM | POA: Insufficient documentation
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
## 2021-12-17 NOTE — Progress Notes (Signed)
[name], [name] ([id number]) Visit Report for 12/17/2021 Arrival Information Details Patient Name: [name], [name] Date of Service: 12/17/2021 3:30 PM Medical Record Number: [id number] Patient Account Number: [id number] Date of Birth/Sex: 01/20/1961 (61 y.o. F) Treating RN: Yevonne Pax Primary Care Jerald Villalona: PATIENT, NO Other Clinician: Referring Travus Oren: Gala Lewandowsky Treating Cori Justus/Extender: Rowan Blase in Treatment: 5 Visit Information History Since Last Visit All ordered tests and consults were completed: No Patient Arrived: Ambulatory Added or deleted any medications: No Arrival Time: 15:46 Any new allergies or adverse reactions: No Accompanied By: daughter Had a fall or experienced change in No Transfer Assistance: None activities of daily living that may affect Patient Identification Verified: Yes risk of falls: Secondary Verification Process Completed: Yes Signs or symptoms of abuse/neglect since last visito No Patient Requires Transmission-Based Precautions: No Hospitalized since last visit: No Patient Has Alerts: No Implantable device outside of the clinic excluding No cellular tissue based products placed in the center since last visit: Has Dressing in Place as Prescribed: Yes Pain Present Now: No Electronic Signature(s) Unsigned Entered ByYevonne Pax on 12/17/2021 15:57:36 Signature(s): Date(s): [name], [name] ([id number]) -------------------------------------------------------------------------------- Clinic Level of Care Assessment Details Patient Name: [name], [name] Date of Service: 12/17/2021 3:30 PM Medical Record Number: [id number] Patient Account Number: [id number] Date of Birth/Sex: [birth year] (61 y.o. F) Treating RN: Yevonne Pax Primary Care Kavin Weckwerth: PATIENT, NO Other Clinician: Referring Benigna Delisi: Gala Lewandowsky Treating Pegeen Stiger/Extender: Rowan Blase in Treatment: 5 Clinic Level of Care Assessment Items TOOL 4 Quantity  Score X - Use when only an EandM is performed on FOLLOW-UP visit 1 0 ASSESSMENTS - Nursing Assessment / Reassessment X - Reassessment of Co-morbidities (includes updates in patient status) 1 10 X- 1 5 Reassessment of Adherence to Treatment Plan ASSESSMENTS - Wound and Skin Assessment / Reassessment []  - Simple Wound Assessment / Reassessment - one wound 0 X- 2 5 Complex Wound Assessment / Reassessment - multiple wounds []  - 0 Dermatologic / Skin Assessment (not related to wound area) ASSESSMENTS - Focused Assessment []  - Circumferential Edema Measurements - multi extremities 0 []  - 0 Nutritional Assessment / Counseling / Intervention []  - 0 Lower Extremity Assessment (monofilament, tuning fork, pulses) []  - 0 Peripheral Arterial Disease Assessment (using hand held doppler) ASSESSMENTS - Ostomy and/or Continence Assessment and Care []  - Incontinence Assessment and Management 0 []  - 0 Ostomy Care Assessment and Management (repouching, etc.) PROCESS - Coordination of Care X - Simple Patient / Family Education for ongoing care 1 15 []  - 0 Complex (extensive) Patient / Family Education for ongoing care []  - 0 Staff obtains , Records, Test Results / Process Orders []  - 0 Staff telephones HHA, Nursing Homes / Clarify orders / etc []  - 0 Routine Transfer to another Facility (non-emergent condition) []  - 0 Routine Hospital Admission (non-emergent condition) []  - 0 New Admissions / / Ordering NPWT, Apligraf, etc. []  - 0 Emergency Hospital Admission (emergent condition) X- 1 10 Simple Discharge Coordination []  - 0 Complex (extensive) Discharge Coordination PROCESS - Special Needs []  - Pediatric / Minor Patient Management 0 []  - 0 Isolation Patient Management []  - 0 Hearing / Language / Visual special needs []  - 0 Assessment of Community assistance (transportation, D/C planning, etc.) []  - 0 Additional assistance / Altered mentation []  -  0 Support Surface(s) Assessment (bed, cushion, seat, etc.) INTERVENTIONS - Wound Cleansing / Measurement [name], [name] ( ) []  - 0 Simple Wound Cleansing - one wound X- 2 5 Complex  Wound Cleansing - multiple wounds X- 1 5 Wound Imaging (photographs - any number of wounds) []  - 0 Wound Tracing (instead of photographs) []  - 0 Simple Wound Measurement - one wound X- 2 5 Complex Wound Measurement - multiple wounds INTERVENTIONS - Wound Dressings []  - Small Wound Dressing one or multiple wounds 0 []  - 0 Medium Wound Dressing one or multiple wounds []  - 0 Large Wound Dressing one or multiple wounds X- 1 5 Application of Medications - topical []  - 0 Application of Medications - injection INTERVENTIONS - Miscellaneous []  - External ear exam 0 []  - 0 Specimen Collection (cultures, biopsies, blood, body fluids, etc.) []  - 0 Specimen(s) / Culture(s) sent or taken to Lab for analysis []  - 0 Patient Transfer (multiple staff / / Similar devices) []  - 0 Simple Staple / Suture removal (25 or less) []  - 0 Complex Staple / Suture removal (26 or more) []  - 0 Hypo / Hyperglycemic Management (close monitor of Blood Glucose) []  - 0 Ankle / Brachial Index (ABI) - do not check if billed separately X- 1 5 Vital Signs Has the patient been seen at the hospital within the last three years: Yes Total Score: 85 Level Of Care: New/Established - Level 3 Electronic Signature(s) Unsigned Entered By on 12/17/2021 16:10:45 Signature(s): Date(s): [name], [name] ( ) -------------------------------------------------------------------------------- Encounter Discharge Information Details Patient Name: [name], [name] Date of Service: 12/17/2021 3:30 PM Medical Record Number: Patient Account Number: Date of Birth/Sex: Nov 24, 1960 (61 y.o. F) Treating RN: Nurse, adult Primary Care Micaylah Bertucci: PATIENT, NO Other Clinician: Referring  Sava Proby: Treating Bijou Easler/Extender: in Treatment: 5 Encounter Discharge Information Items Discharge Condition: Stable Ambulatory Status: Ambulatory Discharge Destination: Home Transportation: Private Auto Accompanied By: daughter Schedule Follow-up Appointment: Yes Clinical Summary of Care: Electronic Signature(s) Unsigned Entered By on 12/17/2021 16:11:54 Signature(s): Date(s): [name], [name] (Yevonne Pax) -------------------------------------------------------------------------------- Lower Extremity Assessment Details Patient Name: [name], [name] Date of Service: 12/17/2021 3:30 PM Medical Record Number: Marland Kitchen Patient Account Number: [id number] Date of Birth/Sex: [birth year] (61 y.o. F) Treating RN: [id number] Primary Care Jovontae Banko: PATIENT, NO Other Clinician: Referring Jozeph Persing: 09/30/1960 Treating Jerardo Costabile/Extender: 77 in Treatment: 5 Edema Assessment Assessed: [Left: No] [Right: No] Edema: [Left: Ye] [Right: s] Calf Left: Right: Point of Measurement: 30 cm From Medial Instep 32 cm Ankle Left: Right: Point of Measurement: 10 cm From Medial Instep 22 cm Vascular Assessment Pulses: Dorsalis Pedis Palpable: [Left:Yes] Electronic Signature(s) Unsigned Entered ByYevonne Pax on 12/17/2021 15:59:55 Signature(s): Date(s): Mayabb, Kielee Moreno. (Rowan Blase) -------------------------------------------------------------------------------- Multi Wound Chart Details Patient Name: [name], [name] Date of Service: 12/17/2021 3:30 PM Medical Record Number: 02/16/2022 Patient Account Number: [id number] Date of Birth/Sex: 1960-04-11 (61 y.o. F) Treating RN: [id number] Primary Care Latoy Labriola: PATIENT, NO Other Clinician: Referring Harnoor Kohles: 09/30/1960 Treating Matthewjames Petrasek/Extender: 77 in Treatment: 5 Vital Signs Height(in): 67 Pulse(bpm): 69 Weight(lbs): 169 Blood Pressure(mmHg):  157/105 Body Mass Index(BMI): 26.5 Temperature(F): 98.2 Respiratory Rate(breaths/min): 18 Photos: [N/A:N/A] Wound Location: Left, Proximal, Medial Lower Leg Left, Distal, Medial Lower Leg N/A Wounding Event: Trauma Trauma N/A Primary Etiology: Lesion Lesion N/A Comorbid History: Hypertension Hypertension N/A Date Acquired: 09/15/2021 09/15/2021 N/A Weeks of Treatment: 5 5 N/A Wound Status: Open Open N/A Wound Recurrence: No No N/A Measurements L x W x D (cm) 4.5x3.5x0.1 1x0.3x0.4 N/A Area (cm) : 12.37 0.236 N/A Volume (cm) : 1.237 0.094 N/A % Reduction in Area: 65.00% 91.20% N/A %  Reduction in Volume: 88.30% 91.20% N/A Classification: Full Thickness Without Exposed Full Thickness Without Exposed N/A Support Structures Support Structures Exudate Amount: Medium Medium N/A Exudate Type: Serosanguineous Serosanguineous N/A Exudate Color: red, brown red, brown N/A Granulation Amount: Medium (34-66%) Medium (34-66%) N/A Necrotic Amount: Medium (34-66%) Medium (34-66%) N/A Exposed Structures: Fat Layer (Subcutaneous Tissue): Fat Layer (Subcutaneous Tissue): N/A Yes Yes Fascia: No Fascia: No Tendon: No Tendon: No Muscle: No Muscle: No Joint: No Joint: No Bone: No Bone: No Epithelialization: Small (1-33%) N/A N/A Treatment Notes Electronic Signature(s) Unsigned Entered ByCarlene Coria on 12/17/2021 16:01:01 Signature(s): Date(s): Hewlett, Granville Lewis ([id number]) -------------------------------------------------------------------------------- [place] Details Patient Name: [name], [name] Date of Service: 12/17/2021 3:30 PM Medical Record Number: [id number] Patient Account Number: [id number] Date of Birth/Sex: 04-08-1960 (60 y.o. F) Treating RN: Carlene Coria Primary Care Khyan Oats: PATIENT, NO Other Clinician: Referring Dijon Cosens: Daylene Katayama Treating Benson Porcaro/Extender: Skipper Cliche in Treatment: 5 Active Inactive Wound/Skin Impairment Nursing  Diagnoses: Knowledge deficit related to ulceration/compromised skin integrity Goals: Patient/caregiver will verbalize understanding of skin care regimen Date Initiated: 11/08/2021 Target Resolution Date: 01/08/2022 Goal Status: Active Ulcer/skin breakdown will have a volume reduction of 30% by week 4 Date Initiated: 11/08/2021 Date Inactivated: 12/17/2021 Target Resolution Date: 12/09/2021 Goal Status: Unmet Unmet Reason: comordbities Ulcer/skin breakdown will have a volume reduction of 50% by week 8 Date Initiated: 11/08/2021 Target Resolution Date: 01/08/2022 Goal Status: Active Ulcer/skin breakdown will have a volume reduction of 80% by week 12 Date Initiated: 11/08/2021 Target Resolution Date: 02/08/2022 Goal Status: Active Ulcer/skin breakdown will heal within 14 weeks Date Initiated: 11/08/2021 Target Resolution Date: 03/10/2022 Goal Status: Active Interventions: Assess patient/caregiver ability to obtain necessary supplies Assess patient/caregiver ability to perform ulcer/skin care regimen upon admission and as needed Assess ulceration(s) every visit Notes: Electronic Signature(s) Unsigned Entered By: Carlene Coria on 12/17/2021 16:00:32 Signature(s): Date(s): Loja, Arleny Moreno. ([id number]) -------------------------------------------------------------------------------- Pain Assessment Details Patient Name: [name], [name] Date of Service: 12/17/2021 3:30 PM Medical Record Number: [id number] Patient Account Number: [id number] Date of Birth/Sex: [birth year] (61 y.o. F) Treating RN: Carlene Coria Primary Care Izyk Marty: PATIENT, NO Other Clinician: Referring Bellami Farrelly: Daylene Katayama Treating Jaydan Meidinger/Extender: Skipper Cliche in Treatment: 5 Active Problems Location of Pain Severity and Description of Pain Patient Has Paino No Site Locations Pain Management and Medication Current Pain Management: Electronic Signature(s) Unsigned Entered ByCarlene Coria on 12/17/2021  15:58:20 Signature(s): Date(s): Lacivita, Granville Lewis ([id number]) -------------------------------------------------------------------------------- Patient/Caregiver Education Details Patient Name: [name] Bast Moreno. Date of Service: 12/17/2021 3:30 PM Medical Record Number: [id number] Patient Account Number: [id number] Date of Birth/Gender: 1960/09/05 (61 y.o. F) Treating RN: Carlene Coria Primary Care Physician: PATIENT, NO Other Clinician: Referring Physician: Daylene Katayama Treating Physician/Extender: Skipper Cliche in Treatment: 5 Education Assessment Education Provided To: Patient Education Topics Provided Wound/Skin Impairment: Methods: Explain/Verbal Responses: State content correctly Electronic Signature(s) Unsigned Entered By: Carlene Coria on 12/17/2021 16:11:05 Signature(s): Date(s): Renwick, Rosita Moreno. ([id number]) -------------------------------------------------------------------------------- Wound Assessment Details Patient Name: Burandt, Aryka Moreno. Date of Service: 12/17/2021 3:30 PM Medical Record Number: [id number] Patient Account Number: [id number] Date of Birth/Sex: [birth year] (61 y.o. F) Treating RN: Carlene Coria Primary Care Jaleeya Mcnelly: PATIENT, NO Other Clinician: Referring Youssef Footman: Daylene Katayama Treating Leticia Coletta/Extender: Skipper Cliche in Treatment: 5 Wound Status Wound Number: 1 Primary Etiology: Lesion Wound Location: Left, Proximal, Medial Lower Leg Wound Status: Open Wounding Event: Trauma Comorbid History: Hypertension Date Acquired: 09/15/2021 Weeks Of Treatment: 5 Clustered Wound: No Photos Wound Measurements Length: (cm) 4.5 Width: (cm) 3.5  Depth: (cm) 0.1 Area: (cm) 12.37 Volume: (cm) 1.237 % Reduction in Area: 65% % Reduction in Volume: 88.3% Epithelialization: Small (1-33%) Tunneling: No Undermining: No Wound Description Classification: Full Thickness Without Exposed Support Structu Exudate Amount: Medium Exudate Type:  Serosanguineous Exudate Color: red, brown res Wound Bed Granulation Amount: Medium (34-66%) Exposed Structure Necrotic Amount: Medium (34-66%) Fascia Exposed: No Fat Layer (Subcutaneous Tissue) Exposed: Yes Tendon Exposed: No Muscle Exposed: No Joint Exposed: No Bone Exposed: No Treatment Notes Wound #1 (Lower Leg) Wound Laterality: Left, Medial, Proximal Cleanser Soap and Water Discharge Instruction: black african soap per patient request Wound Cleanser Discharge Instruction: Wash your hands with soap and water. Remove old dressing, discard into plastic bag and place into trash. Cleanse the wound with Wound Cleanser prior to applying a clean dressing using gauze sponges, not tissues or cotton balls. Do not Jonsson, Tanette Moreno. ([id number]) scrub or use excessive force. Pat dry using gauze sponges, not tissue or cotton balls. Peri-Wound Care AandD Ointment Discharge Instruction: Apply AandD Ointment as directed Topical Primary Dressing Telfa Non-adherent Dressing, 3X8 (in/in) Discharge Instruction: Add to wound bed to alleviate sticking. Secondary Dressing Secured With Tubigrip Size C, 2.75x10 (in/yd) Discharge Instruction: Apply 3 Tubigrip C 3-finger-widths below knee to base of toes to secure dressing and/or for swelling. Compression Wrap Compression Stockings Add-Ons Electronic Signature(s) Unsigned Entered By: Yevonne PaxEpps, Carrie on 12/17/2021 15:59:12 Signature(s): Date(s): Fusco, Tana Moreno. ([id number]) -------------------------------------------------------------------------------- Wound Assessment Details Patient Name: Blitzer, Annabel Moreno. Date of Service: 12/17/2021 3:30 PM Medical Record Number: [id number] Patient Account Number: [id number] Date of Birth/Sex: 25-Aug-1960 (61 y.o. F) Treating RN: Yevonne PaxEpps, Carrie Primary Care Libra Gatz: PATIENT, NO Other Clinician: Referring Tyera Hansley: Gala LewandowskyEvans, Brent Treating Antonette Hendricks/Extender: Rowan BlaseStone, Hoyt Weeks in Treatment: 5 Wound Status Wound  Number: 2 Primary Etiology: Lesion Wound Location: Left, Distal, Medial Lower Leg Wound Status: Open Wounding Event: Trauma Comorbid History: Hypertension Date Acquired: 09/15/2021 Weeks Of Treatment: 5 Clustered Wound: No Photos Wound Measurements Length: (cm) 1 Width: (cm) 0.3 Depth: (cm) 0.4 Area: (cm) 0.236 Volume: (cm) 0.094 % Reduction in Area: 91.2% % Reduction in Volume: 91.2% Tunneling: No Undermining: No Wound Description Classification: Full Thickness Without Exposed Support Structu Exudate Amount: Medium Exudate Type: Serosanguineous Exudate Color: red, brown res Wound Bed Granulation Amount: Medium (34-66%) Exposed Structure Necrotic Amount: Medium (34-66%) Fascia Exposed: No Necrotic Quality: Adherent Slough Fat Layer (Subcutaneous Tissue) Exposed: Yes Tendon Exposed: No Muscle Exposed: No Joint Exposed: No Bone Exposed: No Treatment Notes Wound #2 (Lower Leg) Wound Laterality: Left, Medial, Distal Cleanser Soap and Water Discharge Instruction: black african soap per patient request Wound Cleanser Discharge Instruction: Wash your hands with soap and water. Remove old dressing, discard into plastic bag and place into trash. Cleanse the wound with Wound Cleanser prior to applying a clean dressing using gauze sponges, not tissues or cotton balls. Do not Oliphant, Vennela Moreno. ([id number]) scrub or use excessive force. Pat dry using gauze sponges, not tissue or cotton balls. Peri-Wound Care AandD Ointment Discharge Instruction: Apply AandD Ointment as directed Topical Primary Dressing Telfa Non-adherent Dressing, 3X8 (in/in) Discharge Instruction: Add to wound bed to alleviate sticking. Secondary Dressing Secured With Tubigrip Size C, 2.75x10 (in/yd) Discharge Instruction: Apply 3 Tubigrip C 3-finger-widths below knee to base of toes to secure dressing and/or for swelling. Compression Wrap Compression Stockings Add-Ons Electronic  Signature(s) Unsigned Entered By: Yevonne PaxEpps, Carrie on 12/17/2021 15:59:32 Signature(s): Date(s): Ainley, Olanna Moreno. ([id number]) -------------------------------------------------------------------------------- Vitals Details Patient Name: Gerard, Toryn Moreno. Date of Service: 12/17/2021 3:30 PM Medical Record  Number: [id number] Patient Account Number: [id number] Date of Birth/Sex: June 04, 1960 (61 y.o. F) Treating RN: Yevonne Pax Primary Care Nou Chard: PATIENT, NO Other Clinician: Referring Qiara Minetti: Gala Lewandowsky Treating Walterine Amodei/Extender: Rowan Blase in Treatment: 5 Vital Signs Time Taken: 13:57 Temperature (F): 98.2 Height (in): 67 Pulse (bpm): 69 Weight (lbs): 169 Respiratory Rate (breaths/min): 18 Body Mass Index (BMI): 26.5 Blood Pressure (mmHg): 157/105 Reference Range: 80 - 120 mg / dl Electronic Signature(s) Unsigned Entered ByYevonne Pax on 12/17/2021 15:58:07 Signature(s): Date(s):

## 2021-12-31 ENCOUNTER — Encounter: Payer: Medicaid Other | Admitting: Physician Assistant

## 2021-12-31 DIAGNOSIS — S81812A Laceration without foreign body, left lower leg, initial encounter: Secondary | ICD-10-CM | POA: Diagnosis not present
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
## 2022-01-02 NOTE — Progress Notes (Signed)
[name] ([id number]) [phone].pdf Page 1 of 10 Visit Report for 12/31/2021 Arrival Information Details Patient Name: Date of Service: [name], Moreno 12/31/2021 12:30 PM Medical Record Number: [id number] Patient Account Number: [id number] Date of Birth/Sex: Treating RN: 1960/09/02 (61 y.o. Freddy Finner Primary Care Mazell Aylesworth: PA Zenovia Jordan, [place] Other Clinician: Referring Salomon Ganser: Treating Akshaya Toepfer/Extender: Florentina Jenny in Treatment: 7 Visit Information History Since Last Visit All ordered tests and consults were completed: No Patient Arrived: Ambulatory Added or deleted any medications: No Arrival Time: 12:35 Any new allergies or adverse reactions: No Accompanied By: friend Had a fall or experienced change in No Transfer Assistance: None activities of daily living that may affect Patient Identification Verified: Yes risk of falls: Secondary Verification Process Completed: Yes Signs or symptoms of abuse/neglect since last visito No Patient Requires Transmission-Based Precautions: No Hospitalized since last visit: No Patient Has Alerts: No Implantable device outside of the clinic excluding No cellular tissue based products placed in the center since last visit: Has Dressing in Place as Prescribed: Yes Has Compression in Place as Prescribed: Yes Pain Present Now: No Notes patient denies s/s of elevated blood pressure, reports taking medicaitons as ordered, notified Allen Derry PA Electronic Signature(s) Signed: 01/02/2022 8:53:50 AM By: Yevonne Pax RN Entered By: Yevonne Pax on 12/31/2021 12:38:58 -------------------------------------------------------------------------------- Clinic Level of Care Assessment Details Patient Name: Date of Service: [name] 12/31/2021 12:30 PM Medical Record Number: [id number] Patient Account Number: [id number] Date of Birth/Sex: Treating RN: [birth year] (61 y.o. Freddy Finner Primary Care Heer Justiss: PA Zenovia Jordan, [place] Other Clinician: Referring Sully Manzi: Treating Gertrude Tarbet/Extender: Florentina Jenny in Treatment: 7 Clinic Level of Care Assessment Items TOOL 4 Quantity Score X- 1 0 Use when only an EandM is performed on FOLLOW-UP visit [name] ([id number]) [phone].pdf Page 2 of 10 ASSESSMENTS - Nursing Assessment / Reassessment X- 1 10 Reassessment of Co-morbidities (includes updates in patient status) X- 1 5 Reassessment of Adherence to Treatment Plan ASSESSMENTS - Wound and Skin A ssessment / Reassessment []  - 0 Simple Wound Assessment / Reassessment - one wound X- 2 5 Complex Wound Assessment / Reassessment - multiple wounds []  - 0 Dermatologic / Skin Assessment (not related to wound area) ASSESSMENTS - Focused Assessment []  - 0 Circumferential Edema Measurements - multi extremities []  - 0 Nutritional Assessment / Counseling / Intervention []  - 0 Lower Extremity Assessment (monofilament, tuning fork, pulses) []  - 0 Peripheral Arterial Disease Assessment (using hand held doppler) ASSESSMENTS - Ostomy and/or Continence Assessment and Care []  - 0 Incontinence Assessment and Management []  - 0 Ostomy Care Assessment and Management (repouching, etc.) PROCESS - Coordination of Care X - Simple Patient / Family Education for ongoing care 1 15 []  - 0 Complex (extensive) Patient / Family Education for ongoing care []  - 0 Staff obtains , Records, T Results / Process Orders est []  - 0 Staff telephones HHA, Nursing Homes / Clarify orders / etc []  - 0 Routine Transfer to another Facility (non-emergent condition) []  - 0 Routine Hospital Admission (non-emergent condition) []  - 0 New Admissions / / Ordering NPWT Apligraf, etc. , []  - 0 Emergency Hospital Admission (emergent condition) X- 1 10 Simple Discharge Coordination []  - 0 Complex (extensive) Discharge  Coordination PROCESS - Special Needs []  - 0 Pediatric / Minor Patient Management []  - 0 Isolation Patient Management []  - 0 Hearing / Language / Visual special needs []  - 0 Assessment of Community assistance (transportation, D/C planning,  etc.) []  - 0 Additional assistance / Altered mentation []  - 0 Support Surface(s) Assessment (bed, cushion, seat, etc.) INTERVENTIONS - Wound Cleansing / Measurement []  - 0 Simple Wound Cleansing - one wound X- 2 5 Complex Wound Cleansing - multiple wounds X- 1 5 Wound Imaging (photographs - any number of wounds) []  - 0 Wound Tracing (instead of photographs) []  - 0 Simple Wound Measurement - one wound X- 2 5 Complex Wound Measurement - multiple wounds INTERVENTIONS - Wound Dressings []  - 0 Small Wound Dressing one or multiple wounds []  - 0 Medium Wound Dressing one or multiple wounds []  - 0 Large Wound Dressing one or multiple wounds X- 1 5 Application of Medications - topical [name], [name] ([id number]) [id number] Page 3 of 10 []  - 0 Application of Medications - injection INTERVENTIONS - Miscellaneous []  - 0 External ear exam []  - 0 Specimen Collection (cultures, biopsies, blood, body fluids, etc.) []  - 0 Specimen(s) / Culture(s) sent or taken to Lab for analysis []  - 0 Patient Transfer (multiple staff / Michiel SitesHoyer Lift / Similar devices) []  - 0 Simple Staple / Suture removal (25 or less) []  - 0 Complex Staple / Suture removal (26 or more) []  - 0 Hypo / Hyperglycemic Management (close monitor of Blood Glucose) []  - 0 Ankle / Brachial Index (ABI) - do not check if billed separately X- 1 5 Vital Signs Has the patient been seen at the hospital within the last three years: Yes Total Score: 85 Level Of Care: New/Established - Level 3 Electronic Signature(s) Signed: 01/02/2022 8:53:50 AM By: Yevonne PaxEpps, Carrie RN Entered By: Yevonne PaxEpps, Carrie on 12/31/2021  12:54:57 -------------------------------------------------------------------------------- Encounter Discharge Information Details Patient Name: Date of Service: [name], [name] 12/31/2021 12:30 PM Medical Record Number: [id number] Patient Account Number: [id number] Date of Birth/Sex: Treating RN: 1960/07/08 ([age] y.o. Freddy FinnerF) Epps, Carrie Primary Care Aneesa Romey: PA Zenovia [place], [place] Other Clinician: Referring Azad Calame: Treating Twylla Arceneaux/Extender: Florentina JennyStone, Hoyt Evans, Brent Weeks in Treatment: 7 Encounter Discharge Information Items Discharge Condition: Stable Ambulatory Status: Ambulatory Discharge Destination: Home Transportation: Private Auto Accompanied By: self Schedule Follow-up Appointment: Yes Clinical Summary of Care: Patient Declined Electronic Signature(s) Signed: 01/02/2022 8:53:50 AM By: Yevonne PaxEpps, Carrie RN Entered By: Yevonne PaxEpps, Carrie on 12/31/2021 12:55:46 [name], [name] BeetsJUSTINE Moreno ([id number]) [id number] Page 4 of 10 -------------------------------------------------------------------------------- Lower Extremity Assessment Details Patient Name: Date of Service: [name], [name] 12/31/2021 12:30 PM Medical Record Number: [id number] Patient Account Number: [id number] Date of Birth/Sex: Treating RN: 1960/07/08 (61 y.o. Freddy FinnerF) Epps, Carrie Primary Care Kiyra Slaubaugh: PA Zenovia [place], [place] Other Clinician: Referring Jamilah Jean: Treating Reace Breshears/Extender: Florentina JennyStone, Hoyt Evans, Brent Weeks in Treatment: 7 Edema Assessment Assessed: [Left: No] [Right: No] Edema: [Left: Ye] [Right: s] Calf Left: Right: Point of Measurement: 30 cm From Medial Instep 34 cm Ankle Left: Right: Point of Measurement: 10 cm From Medial Instep 22.4 cm Vascular Assessment Pulses: Dorsalis Pedis Palpable: [Left:Yes] Electronic Signature(s) Signed: 01/02/2022 8:53:50 AM By: Yevonne PaxEpps, Carrie RN Entered By: Yevonne PaxEpps, Carrie on 12/31/2021  12:43:00 -------------------------------------------------------------------------------- Multi Wound Chart Details Patient Name: Date of Service: [name], [name] 12/31/2021 12:30 PM Medical Record Number: [id number] Patient Account Number: [id number] Date of Birth/Sex: Treating RN: 1960/07/08 (61 y.o. Freddy FinnerF) Epps, Carrie Primary Care Tallula Grindle: PA Zenovia [place]IENT, [place] Other Clinician: Referring Kaien Pezzullo: Treating Greggory Safranek/Extender: Florentina JennyStone, Hoyt Evans, Brent Weeks in Treatment: 7 Vital Signs Height(in): 67 Pulse(bpm): 84 Weight(lbs): 169 Blood Pressure(mmHg): 163/107 Body Mass Index(BMI): 26.5 Temperature(F): 98.1 Respiratory Rate(breaths/min): 16 Comins, [name] ([id number]) [id number] Page 5 of 10 [1:Photos:] [N/A:N/A] Left, Proximal, Medial Lower Leg  Left, Distal, Medial Lower Leg N/A Wound Location: Trauma Trauma N/A Wounding Event: Lesion Lesion N/A Primary Etiology: Hypertension Hypertension N/A Comorbid History: 09/15/2021 09/15/2021 N/A Date Acquired: 7 7 N/A Weeks of Treatment: Open Open N/A Wound Status: No No N/A Wound Recurrence: 0x0x0 0.9x0.3x0.1 N/A Measurements L x W x D (cm) 0 0.212 N/A A (cm) : rea 0 0.021 N/A Volume (cm) : 100.00% 92.10% N/A % Reduction in A rea: 100.00% 98.00% N/A % Reduction in Volume: Full Thickness Without Exposed Full Thickness Without Exposed N/A Classification: Support Structures Support Structures None Present Medium N/A Exudate Amount: N/A Serosanguineous N/A Exudate Type: N/A red, brown N/A Exudate Color: None Present (0%) Large (67-100%) N/A Granulation Amount: None Present (0%) None Present (0%) N/A Necrotic Amount: Fascia: No Fat Layer (Subcutaneous Tissue): Yes N/A Exposed Structures: Fat Layer (Subcutaneous Tissue): No Fascia: No Tendon: No Tendon: No Muscle: No Muscle: No Joint: No Joint: No Bone: No Bone: No Large (67-100%) N/A N/A Epithelialization: Treatment Notes Electronic  Signature(s) Signed: 01/02/2022 8:53:50 AM By: Yevonne Pax RN Entered By: Yevonne Pax on 12/31/2021 12:43:22 -------------------------------------------------------------------------------- Multi-Disciplinary Care Plan Details Patient Name: Date of Service: [name] 12/31/2021 12:30 PM Medical Record Number: [id number] Patient Account Number: [id number] Date of Birth/Sex: Treating RN: 02-28-1961 (61 y.o. Freddy Finner Primary Care Ireland Chagnon: PA Zenovia Jordan, [place] Other Clinician: Referring Naysha Sholl: Treating Clarnce Homan/Extender: Florentina Jenny in Treatment: 7 Active Inactive Wound/Skin Impairment Nursing Diagnoses: Knowledge deficit related to ulceration/compromised skin integrity Geil, [name] ([id number]) [phone].pdf Page 6 of 10 Goals: Patient/caregiver will verbalize understanding of skin care regimen Date Initiated: 11/08/2021 Target Resolution Date: 01/08/2022 Goal Status: Active Ulcer/skin breakdown will have a volume reduction of 30% by week 4 Date Initiated: 11/08/2021 Date Inactivated: 12/17/2021 Target Resolution Date: 12/09/2021 Goal Status: Unmet Unmet Reason: comordbities Ulcer/skin breakdown will have a volume reduction of 50% by week 8 Date Initiated: 11/08/2021 Target Resolution Date: 01/08/2022 Goal Status: Active Ulcer/skin breakdown will have a volume reduction of 80% by week 12 Date Initiated: 11/08/2021 Target Resolution Date: 02/08/2022 Goal Status: Active Ulcer/skin breakdown will heal within 14 weeks Date Initiated: 11/08/2021 Target Resolution Date: 03/10/2022 Goal Status: Active Interventions: Assess patient/caregiver ability to obtain necessary supplies Assess patient/caregiver ability to perform ulcer/skin care regimen upon admission and as needed Assess ulceration(s) every visit Notes: Electronic Signature(s) Signed: 01/02/2022 8:53:50 AM By: Yevonne Pax RN Entered By: Yevonne Pax on 12/31/2021  12:43:06 -------------------------------------------------------------------------------- Pain Assessment Details Patient Name: Date of Service: [name], Moreno 12/31/2021 12:30 PM Medical Record Number: [id number] Patient Account Number: [id number] Date of Birth/Sex: Treating RN: Feb 07, 1961 (61 y.o. Freddy Finner Primary Care Salimah Martinovich: PA Zenovia Jordan, [place] Other Clinician: Referring Lydon Vansickle: Treating Caley Volkert/Extender: Florentina Jenny in Treatment: 7 Active Problems Location of Pain Severity and Description of Pain Patient Has Paino No Site Locations Pain Management and Medication Wedin, [name] ([id number]) [phone].pdf Page 7 of 10 Current Pain Management: Electronic Signature(s) Signed: 01/02/2022 8:53:50 AM By: Yevonne Pax RN Entered By: Yevonne Pax on 12/31/2021 12:39:27 -------------------------------------------------------------------------------- Patient/Caregiver Education Details Patient Name: Date of Service: [name] 10/16/2023andnbsp12:30 PM Medical Record Number: [id number] Patient Account Number: [id number] Date of Birth/Gender: Treating RN: August 24, 1960 (61 y.o. Freddy Finner Primary Care Physician: PA Zenovia Jordan, [place] Other Clinician: Referring Physician: Treating Physician/Extender: Florentina Jenny in Treatment: 7 Education Assessment Education Provided To: Patient Education Topics Provided Wound/Skin Impairment: Methods: Explain/Verbal Responses: State content correctly Electronic Signature(s) Signed: 01/02/2022 8:53:50 AM By: Yevonne Pax RN  Entered By: Carlene Coria on 12/31/2021 12:55:15 -------------------------------------------------------------------------------- Wound Assessment Details Patient Name: Date of Service: [name], Moreno 12/31/2021 12:30 PM Medical Record Number: [id number] Patient Account Number: [id number] Date of Birth/Sex: Treating RN: 03/28/1960 (61 y.o. Orvan Falconer Primary Care Jed Kutch: PA Haig Prophet, [place] Other Clinician: Referring Jerald Hennington: Treating Lydiana Milley/Extender: Primitivo Gauze in Treatment: 7 Wound Status Wound Number: 1 Primary Etiology: Lesion Wound Location: Left, Proximal, Medial Lower Leg Wound Status: Open Wounding Event: Trauma Comorbid History: Hypertension Cacho, [name] ([id number]) [id number].pdf Page 8 of 10 Date Acquired: 09/15/2021 Weeks Of Treatment: 7 Clustered Wound: No Photos Wound Measurements Length: (cm) Width: (cm) Depth: (cm) Area: (cm) Volume: (cm) 0 % Reduction in Area: 100% 0 % Reduction in Volume: 100% 0 Epithelialization: Large (67-100%) 0 Tunneling: No 0 Undermining: No Wound Description Classification: Full Thickness Without Exposed Support Exudate Amount: None Present Structures Wound Bed Granulation Amount: None Present (0%) Exposed Structure Necrotic Amount: None Present (0%) Fascia Exposed: No Fat Layer (Subcutaneous Tissue) Exposed: No Tendon Exposed: No Muscle Exposed: No Joint Exposed: No Bone Exposed: No Electronic Signature(s) Signed: 01/02/2022 8:53:50 AM By: Carlene Coria RN Entered By: Carlene Coria on 12/31/2021 12:42:02 -------------------------------------------------------------------------------- Wound Assessment Details Patient Name: Date of Service: [name], Moreno 12/31/2021 12:30 PM Medical Record Number: [id number] Patient Account Number: [id number] Date of Birth/Sex: Treating RN: October 31, 1960 (61 y.o. Orvan Falconer Primary Care Ritu Gagliardo: PA Haig Prophet, [place] Other Clinician: Referring Karthikeya Funke: Treating Mariapaula Krist/Extender: Griselda Miner Weeks in Treatment: 7 Wound Status Wound Number: 2 Primary Etiology: Lesion Wound Location: Left, Distal, Medial Lower Leg Wound Status: Open Wounding Event: Trauma Comorbid History: Hypertension Date Acquired: 09/15/2021 Weeks Of Treatment: 7 Clustered Wound: No Notaro, Alexiana Moreno  ([id number]) [phone].pdf Page 9 of 10 Photos Wound Measurements Length: (cm) 0.9 Width: (cm) 0.3 Depth: (cm) 0.1 Area: (cm) 0.212 Volume: (cm) 0.021 % Reduction in Area: 92.1% % Reduction in Volume: 98% Tunneling: No Undermining: No Wound Description Classification: Full Thickness Without Exposed Support Structures Exudate Amount: Medium Exudate Type: Serosanguineous Exudate Color: red, brown Foul Odor After Cleansing: No Slough/Fibrino No Wound Bed Granulation Amount: Large (67-100%) Exposed Structure Necrotic Amount: None Present (0%) Fascia Exposed: No Fat Layer (Subcutaneous Tissue) Exposed: Yes Tendon Exposed: No Muscle Exposed: No Joint Exposed: No Bone Exposed: No Treatment Notes Wound #2 (Lower Leg) Wound Laterality: Left, Medial, Distal Cleanser Soap and Water Discharge Instruction: black african soap per patient request Wound Cleanser Discharge Instruction: Wash your hands with soap and water. Remove old dressing, discard into plastic bag and place into trash. Cleanse the wound with Wound Cleanser prior to applying a clean dressing using gauze sponges, not tissues or cotton balls. Do not scrub or use excessive force. Pat dry using gauze sponges, not tissue or cotton balls. Peri-Wound Care AandD Ointment Discharge Instruction: Apply AandD Ointment as directed Topical Primary Dressing T Non-adherent Dressing, 3X8 (in/in) elfa Discharge Instruction: Add to wound bed to alleviate sticking. Secondary Dressing Secured With Tubigrip Size C, 2.75x10 (in/yd) Discharge Instruction: Apply 3 Tubigrip C 3-finger-widths below knee to base of toes to secure dressing and/or for swelling. Compression Wrap Compression Stockings Add-Ons Electronic Signature(s) Signed: 01/02/2022 8:53:50 AM By: Carlene Coria RN Entered By: Carlene Coria on 12/31/2021 12:42:25 Berg, Granville Lewis ([id number]) [id number] Page 10 of  10 -------------------------------------------------------------------------------- Vitals Details Patient Name: Date of Service: [name], Moreno 12/31/2021 12:30 PM Medical Record Number: [id number] Patient Account Number: [id number] Date of Birth/Sex: Treating RN: 08-20-1960 (61 y.o. F) Epps, Carrie Primary  Care Valyncia Wiens: PA Zenovia Jordan, [place] Other Clinician: Referring Alecxis Baltzell: Treating Ganon Demasi/Extender: Florentina Jenny in Treatment: 7 Vital Signs Time Taken: 12:39 Temperature (F): 98.1 Height (in): 67 Pulse (bpm): 84 Weight (lbs): 169 Respiratory Rate (breaths/min): 16 Body Mass Index (BMI): 26.5 Blood Pressure (mmHg): 163/107 Reference Range: 80 - 120 mg / dl Electronic Signature(s) Signed: 01/02/2022 8:53:50 AM By: Yevonne Pax RN Entered By: Yevonne Pax on 12/31/2021 12:39:16

## 2022-01-14 ENCOUNTER — Encounter: Payer: Medicaid Other | Admitting: Physician Assistant

## 2022-01-14 DIAGNOSIS — S81812A Laceration without foreign body, left lower leg, initial encounter: Secondary | ICD-10-CM | POA: Diagnosis not present
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
## 2022-01-15 NOTE — Progress Notes (Signed)
[name] ([id number]) [id number] Page 1 of 8 Visit Report for Moreno Arrival Information Details Patient Name: Date of Service: [name], [name] Moreno 9:45 A M Medical Record Number: [id number] Patient Account Number: [id number] Date of Birth/Sex: Treating RN: [birth year] (61 y.o. [name] Primary Care [name]: PA [name], [place] Other Clinician: Betha Moreno Referring [name]: Treating [name]/Extender: [name] in Treatment: 9 Visit Information History Since Last Visit All ordered tests and consults were completed: No Patient Arrived: Ambulatory Added or deleted any medications: No Arrival Time: 09:55 Any new allergies or adverse reactions: No Transfer Assistance: None Had a fall or experienced change in No Patient Requires Transmission-Based Precautions: No activities of daily living that may affect Patient Has Alerts: No risk of falls: Hospitalized since last visit: No Pain Present Now: Yes Electronic Signature(s) Signed: 01/14/2022 5:32:10 PM By: [name] Entered By: [name] 09:57:08 -------------------------------------------------------------------------------- Clinic Level of Care Assessment Details Patient Name: Date of Service: [name], [name] Moreno 9:45 A M Medical Record Number: [id number] Patient Account Number: [id number] Date of Birth/Sex: Treating RN: 28-Oct-1960 (61 y.o. [name] Primary Care [name]: PA [name], [place] Other Clinician: Betha Moreno Referring [name]: Treating [name] in Treatment: 9 Clinic Level of Care Assessment Items TOOL 4 Quantity Score []  - 0 Use when only an EandM is performed on FOLLOW-UP visit ASSESSMENTS - Nursing Assessment / Reassessment X- 1 10 Reassessment of Co-morbidities (includes updates in patient status) X- 1 5 Reassessment of Adherence to Treatment Plan ASSESSMENTS -  Wound and Skin A ssessment / Reassessment X - Simple Wound Assessment / Reassessment - one wound 1 5 []  - 0 Complex Wound Assessment / Reassessment - multiple wounds [name], [name] ( ) [id number] Page 2 of 8 []  - 0 Dermatologic / Skin Assessment (not related to wound area) ASSESSMENTS - Focused Assessment []  - 0 Circumferential Edema Measurements - multi extremities []  - 0 Nutritional Assessment / Counseling / Intervention []  - 0 Lower Extremity Assessment (monofilament, tuning fork, pulses) []  - 0 Peripheral Arterial Disease Assessment (using hand held doppler) ASSESSMENTS - Ostomy and/or Continence Assessment and Care []  - 0 Incontinence Assessment and Management []  - 0 Ostomy Care Assessment and Management (repouching, etc.) PROCESS - Coordination of Care X - Simple Patient / Family Education for ongoing care 1 15 []  - 0 Complex (extensive) Patient / Family Education for ongoing care []  - 0 Staff obtains [id number], Records, T Results / Process Orders est []  - 0 Staff telephones HHA, Nursing Homes / Clarify orders / etc []  - 0 Routine Transfer to another Facility (non-emergent condition) []  - 0 Routine Hospital Admission (non-emergent condition) []  - 0 New Admissions / 08-10-1982 / Ordering NPWT Apligraf, etc. , []  - 0 Emergency Hospital Admission (emergent condition) X- 1 10 Simple Discharge Coordination []  - 0 Complex (extensive) Discharge Coordination PROCESS - Special Needs []  - 0 Pediatric / Minor Patient Management []  - 0 Isolation Patient Management []  - 0 Hearing / Language / Visual special needs []  - 0 Assessment of Community assistance (transportation, D/C planning, etc.) []  - 0 Additional assistance / Altered mentation []  - 0 Support Surface(s) Assessment (bed, cushion, seat, etc.) INTERVENTIONS - Wound Cleansing / Measurement X - Simple Wound Cleansing - one wound 1 5 []  - 0 Complex Wound  Cleansing - multiple wounds X- 1 5 Wound Imaging (photographs - any number of wounds) []  - 0 Wound Tracing (instead of photographs) []  -  0 Simple Wound Measurement - one wound []  - 0 Complex Wound Measurement - multiple wounds INTERVENTIONS - Wound Dressings X - Small Wound Dressing one or multiple wounds 1 10 []  - 0 Medium Wound Dressing one or multiple wounds []  - 0 Large Wound Dressing one or multiple wounds X- 1 5 Application of Medications - topical []  - 0 Application of Medications - injection INTERVENTIONS - Miscellaneous []  - 0 External ear exam []  - 0 Specimen Collection (cultures, biopsies, blood, body fluids, etc.) []  - 0 Specimen(s) / Culture(s) sent or taken to Lab for analysis [name] ([id number]) [id number] Page 3 of 8 []  - 0 Patient Transfer (multiple staff / Civil Service fast streamer / Similar devices) []  - 0 Simple Staple / Suture removal (25 or less) []  - 0 Complex Staple / Suture removal (26 or more) []  - 0 Hypo / Hyperglycemic Management (close monitor of Blood Glucose) []  - 0 Ankle / Brachial Index (ABI) - do not check if billed separately X- 1 5 Vital Signs Has the patient been seen at the hospital within the last three years: Yes Total Score: 75 Level Of Care: New/Established - Level 2 Electronic Signature(s) Signed: 01/14/2022 5:32:10 PM By: [name] Entered By: [name] on Moreno 10:23:46 -------------------------------------------------------------------------------- Encounter Discharge Information Details Patient Name: Date of Service: [name] Moreno 9:45 A M Medical Record Number: [id number] Patient Account Number: [id number] Date of Birth/Sex: Treating RN: 1960-09-15 (61 y.o. [name] Primary Care Lilie Vezina: PA [name], [place] Other Clinician: Massie Moreno Referring [name]: Treating [name]/Extender: [name] in Treatment: 9 Encounter Discharge Information  Items Discharge Condition: Stable Ambulatory Status: Ambulatory Discharge Destination: Home Transportation: Private Auto Accompanied By: self Schedule Follow-up Appointment: Yes Clinical Summary of Care: Electronic Signature(s) Signed: 01/14/2022 5:32:10 PM By: [name] Entered By: [name] on Moreno 10:28:18 -------------------------------------------------------------------------------- Lower Extremity Assessment Details Patient Name: Date of Service: [name], [name] Moreno 9:45 A M Medical Record Number: [id number] Patient Account Number: [id number] Date of Birth/Sex: Treating RN: Jan 21, 1961 (61 y.o. [name] Primary Care Delfina Schreurs: PA [name], [place] Other Clinician: Massie Moreno Referring Lacy Taglieri: Treating Tumeka Chimenti/Extender: Griselda Miner [place], Granville Lewis ([id number]) [id number] Page 4 of 8 Weeks in Treatment: 9 Edema Assessment Assessed: [Left: Yes] [Right: No] Edema: [Left: Ye] [Right: s] Calf Left: Right: Point of Measurement: 30 cm From Medial Instep 30.6 cm Ankle Left: Right: Point of Measurement: 10 cm From Medial Instep 23 cm Vascular Assessment Pulses: Dorsalis Pedis Palpable: [Left:Yes] Posterior Tibial Palpable: [Left:Yes] Electronic Signature(s) Signed: 01/14/2022 5:32:10 PM By: [name] Signed: 01/15/2022 12:02:04 PM By: Gretta Cool, BSN, RN, CWS, Kim RN, BSN Entered By: [name] on Moreno 10:07:08 -------------------------------------------------------------------------------- Multi Wound Chart Details Patient Name: Date of Service: [name], [name] Moreno 9:45 A M Medical Record Number: [id number] Patient Account Number: [id number] Date of Birth/Sex: Treating RN: 1960/08/21 (61 y.o. [name] Primary Care Macaria Bias: PA [name], [place] Other Clinician: Massie Moreno Referring Lonie Newsham: Treating Lataya Varnell/Extender: [name] in Treatment: 9 Vital  Signs Height(in): 67 Pulse(bpm): 89 Weight(lbs): 169 Blood Pressure(mmHg): 141/95 Body Mass Index(BMI): 26.5 Temperature(F): 98.6 Respiratory Rate(breaths/min): 16 [2:Photos:] [N/A:N/A] Left, Distal, Medial Lower Leg N/A N/A Wound Location: Trauma N/A N/A Wounding Event: Lesion N/A N/A Primary Etiology: Hypertension N/A N/A Comorbid HistoryBRYANN, GENTZ Moreno ([id number]) [id number] Page 5 of 8 09/15/2021 N/A N/A Date Acquired: 9 N/A N/A Weeks of Treatment: Open N/A N/A Wound Status: No N/A N/A Wound Recurrence: 0.2x0.3x0.1 N/A N/A  Measurements L x W x D (cm) 0.047 N/A N/A A (cm) : rea 0.005 N/A N/A Volume (cm) : 98.20% N/A N/A % Reduction in Area: 99.50% N/A N/A % Reduction in Volume: Full Thickness Without Exposed N/A N/A Classification: Support Structures Medium N/A N/A Exudate Amount: Serosanguineous N/A N/A Exudate Type: red, brown N/A N/A Exudate Color: Large (67-100%) N/A N/A Granulation Amount: None Present (0%) N/A N/A Necrotic Amount: Fat Layer (Subcutaneous Tissue): Yes N/A N/A Exposed Structures: Fascia: No Tendon: No Muscle: No Joint: No Bone: No Treatment Notes Electronic Signature(s) Signed: 01/14/2022 5:32:10 PM By: [name] Entered By: [name] 10:07:34 -------------------------------------------------------------------------------- Multi-Disciplinary Care Plan Details Patient Name: Date of Service: [name] Moreno 9:45 A M Medical Record Number: [id number] Patient Account Number: [id number] Date of Birth/Sex: Treating RN: [birth year] (61 y.o. [name] Primary Care Whitt Auletta: PA [name], [place] Other Clinician: Betha Moreno Referring Treyvonne Tata: Treating Jacie Tristan/Extender: [name] in Treatment: 9 Active Inactive Electronic Signature(s) Signed: 01/14/2022 5:32:10 PM By: [name] Signed: 01/15/2022 12:02:04 PM By: Elliot Gurney BSN, RN, CWS,  Kim RN, BSN Entered By: [name] 10:28:45 Pain Assessment Details -------------------------------------------------------------------------------- [name] ([id number]) [id number] Page 6 of 8 Patient Name: Date of Service: [name], [name] Moreno 9:45 A M Medical Record Number: [id number] Patient Account Number: [id number] Date of Birth/Sex: Treating RN: 1960-04-12 (61 y.o. [name] Primary Care Bev Drennen: PA [name], [place] Other Clinician: Betha Moreno Referring Yossef Gilkison: Treating Delmon Andrada/Extender: [name] in Treatment: 9 Active Problems Location of Pain Severity and Description of Pain Patient Has Paino Yes Site Locations Pain Location: Pain in Ulcers Duration of the Pain. Constant / Intermittento Intermittent Rate the pain. Current Pain Level: 6 Character of Pain Describe the Pain: Shooting Pain Management and Medication Current Pain Management: Medication: Yes Rest: Yes Electronic Signature(s) Signed: 01/14/2022 5:32:10 PM By: [name] Signed: 01/15/2022 12:02:04 PM By: Elliot Gurney, BSN, RN, CWS, Kim RN, BSN Entered By: [name] 10:00:31 -------------------------------------------------------------------------------- Patient/Caregiver Education Details Patient Name: Date of Service: [name] 10/30/2023andnbsp9:45 A M Medical Record Number: [id number] Patient Account Number: [id number] Date of Birth/Gender: Treating RN: 28-Feb-1961 (61 y.o. [name] Primary Care Physician: PA [name], [place] Other Clinician: Betha Moreno Referring Physician: Treating Physician/Extender: [name] in Treatment: 9 Education Assessment Education Provided To: Patient Education Topics Provided Wound/Skin ImpairmentNIMISHA, RATHEL ([id number]) [id number] Page 7 of 8 Handouts: Other: wound is healed. Please call if any  further issues arise Methods: Explain/Verbal Responses: State content correctly Electronic Signature(s) Signed: 01/14/2022 5:32:10 PM By: [name] Entered By: [name] 10:24:26 -------------------------------------------------------------------------------- Wound Assessment Details Patient Name: Date of Service: [name], [name] Moreno 9:45 A M Medical Record Number: [id number] Patient Account Number: [id number] Date of Birth/Sex: Treating RN: 06-11-1960 (61 y.o. [name] Primary Care Verneda Hollopeter: PA [name], [place] Other Clinician: Betha Moreno Referring Gretna Bergin: Treating Leandre Wien/Extender: Baxter Kail Weeks in Treatment: 9 Wound Status Wound Number: 2 Primary Etiology: Lesion Wound Location: Left, Distal, Medial Lower Leg Wound Status: Healed - Epithelialized Wounding Event: Trauma Comorbid History: Hypertension Date Acquired: 09/15/2021 Weeks Of Treatment: 9 Clustered Wound: No Photos Wound Measurements Length: (cm) Width: (cm) Depth: (cm) Area: (cm) Volume: (cm) 0 % Reduction in Area: 100% 0 % Reduction in Volume: 100% 0 Epithelialization: Large (67-100%) 0 Tunneling: No 0 Undermining: No Wound Description Classification: Full Thickness Without Exposed Support Exudate Amount: None Present Structures Foul Odor  After Cleansing: No Slough/Fibrino No Wound Bed Granulation Amount: None Present (0%) Exposed Structure Necrotic Amount: None Present (0%) Fascia Exposed: No Fat Layer (Subcutaneous Tissue) Exposed: No Tendon Exposed: No Muscle Exposed: No Joint Exposed: No Bone Exposed: No [name], [name] ([id number]) [id number] Page 8 of 8 Treatment Notes Wound #2 (Lower Leg) Wound Laterality: Left, Medial, Distal Cleanser Peri-Wound Care Topical Primary Dressing Secondary Dressing Secured With Compression Wrap Compression Stockings Add-Ons Electronic Signature(s) Signed: 01/14/2022  5:32:10 PM By: [name] Signed: 01/15/2022 12:02:04 PM By: Elliot Gurney, BSN, RN, CWS, Kim RN, BSN Entered By: [name] 10:19:59 -------------------------------------------------------------------------------- Vitals Details Patient Name: Date of Service: [name], [name] Moreno 9:45 A M Medical Record Number: [id number] Patient Account Number: [id number] Date of Birth/Sex: Treating RN: 28-Dec-1960 (61 y.o. [name] Primary Care Dayanna Pryce: PA [name], [place] Other Clinician: Betha Moreno Referring Key Cen: Treating Keyarah Mcroy/Extender: [name] in Treatment: 9 Vital Signs Time Taken: 10:00 Temperature (F): 98.6 Height (in): 67 Pulse (bpm): 89 Weight (lbs): 169 Respiratory Rate (breaths/min): 16 Body Mass Index (BMI): 26.5 Blood Pressure (mmHg): 141/95 Reference Range: 80 - 120 mg / dl Electronic Signature(s) Signed: 01/14/2022 5:32:10 PM By: [name] Entered By: [name] 10:00:26

## 2022-02-25 ENCOUNTER — Emergency Department: Payer: Medicaid Other

## 2022-02-25 ENCOUNTER — Emergency Department
Admission: EM | Admit: 2022-02-25 | Discharge: 2022-02-26 | Disposition: A | Discharge status: Home / Self Care | Payer: Medicaid Other | Attending: Emergency Medicine | Admitting: Emergency Medicine

## 2022-02-25 ENCOUNTER — Encounter: Payer: Self-pay | Admitting: Emergency Medicine

## 2022-02-25 DIAGNOSIS — S161XXA Strain of muscle, fascia and tendon at neck level, initial encounter: Secondary | ICD-10-CM | POA: Insufficient documentation

## 2022-02-25 DIAGNOSIS — M542 Cervicalgia: Secondary | ICD-10-CM | POA: Diagnosis present
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
## 2022-02-26 MED ORDER — MUSCLE RUB 10-15 % EX CREA: 1.0000 | TOPICAL_CREAM | CUTANEOUS | 0 refills | Status: DC | PRN

## 2022-02-26 NOTE — ED Notes (Signed)
E-signature pad unavailable - Pt verbalized understanding of D/C information - no additional concerns at this time.  

## 2022-02-26 NOTE — Discharge Instructions (Addendum)
Take acetaminophen 650 mg and ibuprofen 400 mg every 6 hours for pain.  Take with food.  Thank you for choosing us for your health care today!  Please see your primary doctor this week for a follow up appointment.   Sometimes, in the early stages of certain disease courses it is difficult to detect in the emergency department evaluation -- so, it is important that you continue to monitor your symptoms and call your doctor right away or return to the emergency department if you develop any new or worsening symptoms.  Please go to the following website to schedule new (and existing) patient appointments:   [email or web address]  If you do not have a primary doctor try calling the following clinics to establish care:  If you have insurance:  [facility] [phone] [address] [place] [address]   [facility]  [phone] [address] [place] [address] [place] [address]   If you do not have insurance:  [facility]  [phone] [address] [place] [address]   The following is another list of primary care offices in the area who are accepting new patients at this time.  Please reach out to one of them directly and let them know you would like to schedule an appointment to follow up on an Emergency Department visit, and/or to establish a new primary care provider (PCP).  There are likely other primary care clinics in the are who are accepting new patients, but this is an excellent place to start:  [facility] Lead physician: Dr Angela Bacigalupo [address] [place], [place] [address] [phone]  [facility] Lead Physician: Dr Krichna Sowles [address], [place], [place] [address] [phone]  [facility]  Lead Physician: Dr Megan Johnson [address], [place], [place] [address] [phone]  [facility] [place] [facility] Lead Physician: Dr Alex  Karamalegos [address], [place], [place] [address] [phone]  [facility] [place] Lead Physician: Dr Laura Berglund [address], [place],  [address] [phone]   It was my pleasure to care for you today.   Malone Vanblarcom S. Megahn Killings, MD  
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
## 2022-04-04 ENCOUNTER — Other Ambulatory Visit (HOSPITAL_COMMUNITY): Payer: Self-pay

## 2022-08-14 ENCOUNTER — Other Ambulatory Visit: Payer: Self-pay

## 2022-08-14 ENCOUNTER — Emergency Department: Payer: Medicaid Other

## 2022-08-14 ENCOUNTER — Emergency Department
Admission: EM | Admit: 2022-08-14 | Discharge: 2022-08-14 | Disposition: A | Discharge status: Home / Self Care | Payer: Medicaid Other | Attending: Emergency Medicine | Admitting: Emergency Medicine

## 2022-08-14 DIAGNOSIS — G8929 Other chronic pain: Secondary | ICD-10-CM | POA: Insufficient documentation

## 2022-08-14 DIAGNOSIS — M79672 Pain in left foot: Secondary | ICD-10-CM | POA: Diagnosis present

## 2022-08-14 DIAGNOSIS — M25572 Pain in left ankle and joints of left foot: Secondary | ICD-10-CM | POA: Diagnosis not present

## 2022-08-14 DIAGNOSIS — I10 Essential (primary) hypertension: Secondary | ICD-10-CM | POA: Insufficient documentation

## 2022-08-14 DIAGNOSIS — M25511 Pain in right shoulder: Secondary | ICD-10-CM | POA: Diagnosis not present

## 2022-08-14 DIAGNOSIS — M545 Low back pain, unspecified: Secondary | ICD-10-CM | POA: Diagnosis not present

## 2022-08-14 DIAGNOSIS — Z760 Encounter for issue of repeat prescription: Secondary | ICD-10-CM | POA: Insufficient documentation

## 2022-08-14 LAB — URINALYSIS, ROUTINE W REFLEX MICROSCOPIC
Bacteria, UA: NONE SEEN
Bilirubin Urine: NEGATIVE
Glucose, UA: NEGATIVE mg/dL
Hgb urine dipstick: NEGATIVE
Ketones, ur: NEGATIVE mg/dL
Nitrite: NEGATIVE
Protein, ur: NEGATIVE mg/dL
Specific Gravity, Urine: 1.008 (ref 1.005–1.030)
pH: 6 (ref 5.0–8.0)

## 2022-08-14 MED ORDER — NAPROXEN 500 MG PO TABS
500.0000 mg | ORAL_TABLET | Freq: Once | ORAL | Status: AC
  Administered 2022-08-14: 500 mg via ORAL
  Filled 2022-08-14: qty 1

## 2022-08-14 MED ORDER — HYDROCHLOROTHIAZIDE 25 MG PO TABS: 25.0000 mg | ORAL_TABLET | Freq: Every day | ORAL | 0 refills | Status: DC

## 2022-08-14 MED ORDER — MELOXICAM 15 MG PO TABS: 15.0000 mg | ORAL_TABLET | Freq: Every day | ORAL | 2 refills | Status: DC

## 2022-08-14 MED ORDER — HYDROCHLOROTHIAZIDE 25 MG PO TABS
25.0000 mg | ORAL_TABLET | Freq: Once | ORAL | Status: AC
  Administered 2022-08-14: 25 mg via ORAL
  Filled 2022-08-14: qty 1

## 2022-08-14 NOTE — ED Notes (Signed)
Pt still unable to give urine sample at this time.

## 2022-08-14 NOTE — Discharge Instructions (Addendum)
Please take your blood pressure medication daily as prescribed.  I have submitted a referral so that you can have a primary care doctor and get your blood pressure medications monthly. I have sent in a 1 month supply today.  Take the meloxicam daily as prescribed for your pain.

## 2022-08-14 NOTE — ED Notes (Signed)
TV adjusted for pt. Family at bedside. Pt in NAD. Pt requesting something for pain. Provider CBT notified.
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
## 2022-08-14 NOTE — ED Provider Notes (Signed)
[facility] Provider Note    Event Date/Time   First MD Initiated Contact with Patient 08/14/22 1148     (approximate)   History   multiple complaints   HPI  [name] is a 62 y.o. female  with history of hypertension and as listed in EMR presents to the emergency department for treatment and evaluation of right shoulder pain, right lower back pain, and left foot and chronic ankle pain. No new injury. No relief with Tylenol. No dysuria or fever .      Physical Exam   Triage Vital Signs: ED Triage Vitals  Enc Vitals Group     BP 08/14/22 1119 (!) 171/107     Pulse Rate 08/14/22 1119 70     Resp 08/14/22 1119 16     Temp 08/14/22 1119 98.2 F (36.8 C)     Temp Source 08/14/22 1119 Oral     SpO2 08/14/22 1119 100 %     Weight 08/14/22 1121 170 lb (77.1 kg)     Height 08/14/22 1121 5\' 7"  (1.702 m)     Head Circumference --      Peak Flow --      Pain Score 08/14/22 1125 9     Pain Loc --      Pain Edu? --      Excl. in GC? --     Most recent vital signs: Vitals:   08/14/22 1119  BP: (!) 171/107  Pulse: 70  Resp: 16  Temp: 98.2 F (36.8 C)  SpO2: 100%    General: Awake, no distress.  CV:  Good peripheral perfusion.  Resp:  Normal effort.  Abd:  No distention.  Other:  Pain with very light touch to right shoulder, right lower back, and left foot/ankle. Compartments of the left ankle are soft. No pitting edema of the left lower extremity. No midline tenderness of the lower back. Pain in right shoulder with attempt to abduct right arm. No step off or other deformity noted.   ED Results / Procedures / Treatments   Labs (all labs ordered are listed, but only abnormal results are displayed) Labs Reviewed  URINALYSIS, ROUTINE W REFLEX MICROSCOPIC - Abnormal; Notable for the following components:      Result Value   Color, Urine YELLOW (*)    APPearance CLEAR (*)    Leukocytes,Ua SMALL (*)    All other components within  normal limits     EKG  Not indicated.   RADIOLOGY  Image and radiology report reviewed and interpreted by me. Radiology report consistent with the same.  Image of the right shoulder negative for acute bony abnormality.  PROCEDURES:  Critical Care performed: No  Procedures   MEDICATIONS ORDERED IN ED:  Medications  naproxen (NAPROSYN) tablet 500 mg (has no administration in time range)  hydrochlorothiazide (HYDRODIURIL) tablet 25 mg (25 mg Oral Given 08/14/22 1209)     IMPRESSION / MDM / ASSESSMENT AND PLAN / ED COURSE   I have reviewed the triage note.  Differential diagnosis includes, but is not limited to, functional symptoms, osteoarthritis, pyelonephritis, kidney stone  Patient's presentation is most consistent with acute complicated illness / injury requiring diagnostic workup.  62 year old female presenting to the emergency department for treatment and evaluation of multiple medical complaints.  She has had no recent injury.  She is also requesting refill of her blood pressure medicine.  She states that she has been out of it for approximately 2 weeks.  Image of the right shoulder is negative for acute concerns.  Urinalysis results are not consistent with acute cystitis/pyelonephritis.  Primary care provider referral requested for her.  She is to follow-up with whomever she is assigned for future hydrochlorothiazide refills and to discuss chronic pain management.  Prescription for meloxicam was provided today.  ER return precautions discussed.      FINAL CLINICAL IMPRESSION(S) / ED DIAGNOSES   Final diagnoses:  Chronic foot pain, left  Acute pain of right shoulder  Acute right-sided low back pain without sciatica  Medication refill     Rx / DC Orders   ED Discharge Orders          Ordered    hydrochlorothiazide (HYDRODIURIL) 25 MG tablet  Daily        08/14/22 1307    meloxicam (MOBIC) 15 MG tablet  Daily        08/14/22 1307    Ambulatory  Referral to Primary Care (Establish Care)        08/14/22 1308             Note:  This document was prepared using Dragon voice recognition software and may include unintentional dictation errors.   Chinita Pester, FNP 08/14/22 1458    Willy Eddy, MD 08/14/22 1504

## 2022-08-14 NOTE — ED Notes (Signed)
Pt verbalizes understanding of discharge instructions. Opportunity for questioning and answers were provided. Pt discharged from ED to home with family.    

## 2022-10-29 ENCOUNTER — Ambulatory Visit
Admission: RE | Admit: 2022-10-29 | Discharge: 2022-10-29 | Disposition: A | Discharge status: Home / Self Care | Payer: MEDICAID | Attending: Family Medicine | Admitting: Family Medicine

## 2022-10-29 ENCOUNTER — Ambulatory Visit: Payer: MEDICAID | Admitting: Family Medicine

## 2022-10-29 ENCOUNTER — Ambulatory Visit
Admission: RE | Admit: 2022-10-29 | Discharge: 2022-10-29 | Disposition: A | Discharge status: Home / Self Care | Payer: MEDICAID | Source: Ambulatory Visit | Attending: Family Medicine | Admitting: Family Medicine

## 2022-10-29 ENCOUNTER — Encounter: Payer: Self-pay | Admitting: Family Medicine

## 2022-10-29 VITALS — BP 154/98 | HR 75 | Ht 67.0 in | Wt 163.0 lb

## 2022-10-29 DIAGNOSIS — G8929 Other chronic pain: Secondary | ICD-10-CM

## 2022-10-29 DIAGNOSIS — M79672 Pain in left foot: Secondary | ICD-10-CM | POA: Insufficient documentation

## 2022-10-29 DIAGNOSIS — M25572 Pain in left ankle and joints of left foot: Secondary | ICD-10-CM | POA: Diagnosis not present

## 2022-10-29 DIAGNOSIS — F330 Major depressive disorder, recurrent, mild: Secondary | ICD-10-CM | POA: Diagnosis not present

## 2022-10-29 DIAGNOSIS — I10 Essential (primary) hypertension: Secondary | ICD-10-CM | POA: Diagnosis not present

## 2022-10-29 DIAGNOSIS — M545 Low back pain, unspecified: Secondary | ICD-10-CM | POA: Diagnosis not present

## 2022-10-29 DIAGNOSIS — K219 Gastro-esophageal reflux disease without esophagitis: Secondary | ICD-10-CM | POA: Diagnosis not present

## 2022-10-29 MED ORDER — ONDANSETRON 4 MG PO TBDP: 4.0000 mg | ORAL_TABLET | Freq: Four times a day (QID) | ORAL | 2 refills | Status: DC | PRN

## 2022-10-29 MED ORDER — MELOXICAM 15 MG PO TABS: 15.0000 mg | ORAL_TABLET | Freq: Every day | ORAL | 1 refills | Status: DC | PRN

## 2022-10-29 MED ORDER — HYDROCHLOROTHIAZIDE 25 MG PO TABS: 25.0000 mg | ORAL_TABLET | Freq: Every day | ORAL | 1 refills | Status: DC

## 2022-10-29 MED ORDER — FLUOXETINE HCL 40 MG PO CAPS: 40.0000 mg | ORAL_CAPSULE | Freq: Every morning | ORAL | 1 refills | Status: DC

## 2022-10-29 MED ORDER — OMEPRAZOLE 20 MG PO CPDR: 20.0000 mg | DELAYED_RELEASE_CAPSULE | Freq: Every day | ORAL | 1 refills | Status: DC

## 2022-10-29 NOTE — Patient Instructions (Addendum)
Thank you for coming to the office today.  X-rays today ankle and foot.  Medications refilled.  Take stomach acid medicine every day  Meloxicam as needed for inflammation back pain, can take 1-2 weeks then pause.  Restart Fluoxetine 40mg  daily  Please schedule a Follow-up Appointment to: Return in about 3 months (around 01/29/2023) for 3 month follow-up HTN, Back Pain, Mood, Updates.  If you have any other questions or concerns, please feel free to call the office or send a message through MyChart. You may also schedule an earlier appointment if necessary.  Additionally, you may be receiving a survey about your experience at our office within a few days to 1 week by e-mail or mail. We value your feedback.  Saralyn Pilar, DO [facility], [place]
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
## 2022-10-30 ENCOUNTER — Encounter: Payer: Self-pay | Admitting: Family Medicine

## 2022-11-05 ENCOUNTER — Other Ambulatory Visit: Payer: Self-pay | Admitting: Family Medicine

## 2022-11-05 DIAGNOSIS — G8929 Other chronic pain: Secondary | ICD-10-CM

## 2022-11-05 DIAGNOSIS — S92352G Displaced fracture of fifth metatarsal bone, left foot, subsequent encounter for fracture with delayed healing: Secondary | ICD-10-CM

## 2022-11-05 DIAGNOSIS — M84375A Stress fracture, left foot, initial encounter for fracture: Secondary | ICD-10-CM

## 2022-11-08 ENCOUNTER — Ambulatory Visit: Payer: Medicaid Other | Admitting: Podiatry

## 2022-11-19 ENCOUNTER — Ambulatory Visit: Payer: MEDICAID | Admitting: Podiatry

## 2023-02-05 ENCOUNTER — Ambulatory Visit: Payer: MEDICAID | Admitting: Family Medicine

## 2023-02-10 ENCOUNTER — Ambulatory Visit: Payer: MEDICAID | Admitting: Family Medicine

## 2023-04-05 ENCOUNTER — Other Ambulatory Visit: Payer: Self-pay | Admitting: Family Medicine

## 2023-04-05 DIAGNOSIS — K219 Gastro-esophageal reflux disease without esophagitis: Secondary | ICD-10-CM

## 2023-04-07 NOTE — Telephone Encounter (Signed)
Requested medication (s) are due for refill today: Yes  Requested medication (s) are on the active medication list: Yes  Last refill:  11/08/22  Future visit scheduled: No  Notes to clinic:  Not delegated.    Requested Prescriptions  Pending Prescriptions Disp Refills   ondansetron (ZOFRAN-ODT) 4 MG disintegrating tablet [Pharmacy Med Name: Ondansetron 4 MG Oral Tablet Disintegrating] 30 tablet 0    Sig: DISSOLVE 1 TABLET IN MOUTH EVERY 6 HOURS AS NEEDED FOR NAUSEA FOR VOMITING     Not Delegated - Gastroenterology: Antiemetics - ondansetron Failed - 04/07/2023 11:37 AM      Failed - This refill cannot be delegated      Failed - AST in normal range and within 360 days    AST  Date Value Ref Range Status  03/03/2020 50 (H) 15 - 41 U/L Final   SGOT(AST)  Date Value Ref Range Status  04/10/2011 55 (H) 15 - 37 Unit/L Final         Failed - ALT in normal range and within 360 days    ALT  Date Value Ref Range Status  03/03/2020 44 0 - 44 U/L Final   SGPT (ALT)  Date Value Ref Range Status  04/10/2011 71 U/L Final    Comment:    12-78 NOTE: NEW REFERENCE RANGE 02/08/2011          Passed - Valid encounter within last 6 months    Recent Outpatient Visits           5 months ago Benign essential HTN   [place] [facility] [place], Netta Neat, DO

## 2023-04-18 ENCOUNTER — Other Ambulatory Visit: Payer: Self-pay | Admitting: Family Medicine

## 2023-04-18 DIAGNOSIS — K219 Gastro-esophageal reflux disease without esophagitis: Secondary | ICD-10-CM

## 2023-04-18 NOTE — Telephone Encounter (Signed)
Requested medication (s) are due for refill today: Yes  Requested medication (s) are on the active medication list: Yes  Last refill:  10/29/22  Future visit scheduled: No  Notes to clinic:  Unable to refill per protocol, cannot delegate.      Requested Prescriptions  Pending Prescriptions Disp Refills   ondansetron (ZOFRAN-ODT) 4 MG disintegrating tablet [Pharmacy Med Name: Ondansetron 4 MG Oral Tablet Disintegrating] 30 tablet 0    Sig: DISSOLVE 1 TABLET IN MOUTH EVERY 6 HOURS AS NEEDED FOR NAUSEA FOR VOMITING     Not Delegated - Gastroenterology: Antiemetics - ondansetron Failed - 04/18/2023  3:39 PM      Failed - This refill cannot be delegated      Failed - AST in normal range and within 360 days    AST  Date Value Ref Range Status  03/03/2020 50 (H) 15 - 41 U/L Final   SGOT(AST)  Date Value Ref Range Status  04/10/2011 55 (H) 15 - 37 Unit/L Final         Failed - ALT in normal range and within 360 days    ALT  Date Value Ref Range Status  03/03/2020 44 0 - 44 U/L Final   SGPT (ALT)  Date Value Ref Range Status  04/10/2011 71 U/L Final    Comment:    12-78 NOTE: NEW REFERENCE RANGE 02/08/2011          Passed - Valid encounter within last 6 months    Recent Outpatient Visits           5 months ago Benign essential HTN   [place] [facility] [place], Netta Neat, DO

## 2023-06-13 ENCOUNTER — Other Ambulatory Visit: Payer: Self-pay | Admitting: Family Medicine

## 2023-06-13 DIAGNOSIS — I10 Essential (primary) hypertension: Secondary | ICD-10-CM

## 2023-06-16 NOTE — Telephone Encounter (Signed)
 Requested medications are due for refill today.  yes  Requested medications are on the active medications list.  yes  Last refill. 10/29/2022 #90 1 rf  Future visit scheduled.   no  Notes to clinic.  Pt only seen 1x 10/29/2022. No pcp listed.    Requested Prescriptions  Pending Prescriptions Disp Refills   hydrochlorothiazide (HYDRODIURIL) 25 MG tablet [Pharmacy Med Name: hydroCHLOROthiazide 25 MG Oral Tablet] 90 tablet 0    Sig: Take 1 tablet by mouth once daily     Cardiovascular: Diuretics - Thiazide Failed - 06/16/2023  2:31 PM      Failed - Cr in normal range and within 180 days    Creatinine  Date Value Ref Range Status  05/26/2013 0.78 0.60 - 1.30 mg/dL Final   Creatinine, Ser  Date Value Ref Range Status  05/08/2021 0.76 0.44 - 1.00 mg/dL Final         Failed - K in normal range and within 180 days    Potassium  Date Value Ref Range Status  05/08/2021 4.5 3.5 - 5.1 mmol/L Final  05/26/2013 4.1 3.5 - 5.1 mmol/L Final         Failed - Na in normal range and within 180 days    Sodium  Date Value Ref Range Status  05/08/2021 140 135 - 145 mmol/L Final  05/26/2013 139 136 - 145 mmol/L Final         Failed - Last BP in normal range    BP Readings from Last 1 Encounters:  10/30/22 (!) 154/98         Failed - Valid encounter within last 6 months    Recent Outpatient Visits   None

## 2023-06-23 ENCOUNTER — Other Ambulatory Visit: Payer: Self-pay | Admitting: Family Medicine

## 2023-06-23 DIAGNOSIS — I10 Essential (primary) hypertension: Secondary | ICD-10-CM

## 2023-06-24 NOTE — Telephone Encounter (Signed)
 Requested medications are due for refill today.  yes   Requested medications are on the active medications list.  yes   Last refill. 10/29/2022 #90 1 rf   Future visit scheduled.   no   Notes to clinic.  Pt only seen 1x 10/29/2022. No pcp listed.    Requested Prescriptions  Pending Prescriptions Disp Refills   hydrochlorothiazide (HYDRODIURIL) 25 MG tablet [Pharmacy Med Name: hydroCHLOROthiazide 25 MG Oral Tablet] 90 tablet 0    Sig: Take 1 tablet by mouth once daily     Cardiovascular: Diuretics - Thiazide Failed - 06/24/2023  1:25 PM      Failed - Cr in normal range and within 180 days    Creatinine  Date Value Ref Range Status  05/26/2013 0.78 0.60 - 1.30 mg/dL Final   Creatinine, Ser  Date Value Ref Range Status  05/08/2021 0.76 0.44 - 1.00 mg/dL Final         Failed - K in normal range and within 180 days    Potassium  Date Value Ref Range Status  05/08/2021 4.5 3.5 - 5.1 mmol/L Final  05/26/2013 4.1 3.5 - 5.1 mmol/L Final         Failed - Na in normal range and within 180 days    Sodium  Date Value Ref Range Status  05/08/2021 140 135 - 145 mmol/L Final  05/26/2013 139 136 - 145 mmol/L Final         Failed - Last BP in normal range    BP Readings from Last 1 Encounters:  10/30/22 (!) 154/98         Failed - Valid encounter within last 6 months    Recent Outpatient Visits   None

## 2023-06-24 NOTE — Telephone Encounter (Signed)
 Requested medications are due for refill today.  yes   Requested medications are on the active medications list.  yes   Last refill. 10/29/2022 #90 1 rf   Future visit scheduled.   no   Notes to clinic.  Pt only seen 1x 10/29/2022. No pcp listed.     Requested Prescriptions  Pending Prescriptions Disp Refills   hydrochlorothiazide (HYDRODIURIL) 25 MG tablet [Pharmacy Med Name: hydroCHLOROthiazide 25 MG Oral Tablet] 90 tablet 0    Sig: Take 1 tablet by mouth once daily     Cardiovascular: Diuretics - Thiazide Failed - 06/24/2023  1:26 PM      Failed - Cr in normal range and within 180 days    Creatinine  Date Value Ref Range Status  05/26/2013 0.78 0.60 - 1.30 mg/dL Final   Creatinine, Ser  Date Value Ref Range Status  05/08/2021 0.76 0.44 - 1.00 mg/dL Final         Failed - K in normal range and within 180 days    Potassium  Date Value Ref Range Status  05/08/2021 4.5 3.5 - 5.1 mmol/L Final  05/26/2013 4.1 3.5 - 5.1 mmol/L Final         Failed - Na in normal range and within 180 days    Sodium  Date Value Ref Range Status  05/08/2021 140 135 - 145 mmol/L Final  05/26/2013 139 136 - 145 mmol/L Final         Failed - Last BP in normal range    BP Readings from Last 1 Encounters:  10/30/22 (!) 154/98         Failed - Valid encounter within last 6 months    Recent Outpatient Visits   None

## 2023-06-26 ENCOUNTER — Other Ambulatory Visit: Payer: Self-pay | Admitting: Family Medicine

## 2023-06-26 DIAGNOSIS — I10 Essential (primary) hypertension: Secondary | ICD-10-CM

## 2023-06-27 NOTE — Telephone Encounter (Signed)
 Requested Prescriptions  Pending Prescriptions Disp Refills   hydrochlorothiazide (HYDRODIURIL) 25 MG tablet [Pharmacy Med Name: hydroCHLOROthiazide 25 MG Oral Tablet] 90 tablet 0    Sig: Take 1 tablet by mouth once daily     Cardiovascular: Diuretics - Thiazide Failed - 06/27/2023 11:02 AM      Failed - Cr in normal range and within 180 days    Creatinine  Date Value Ref Range Status  05/26/2013 0.78 0.60 - 1.30 mg/dL Final   Creatinine, Ser  Date Value Ref Range Status  05/08/2021 0.76 0.44 - 1.00 mg/dL Final         Failed - K in normal range and within 180 days    Potassium  Date Value Ref Range Status  05/08/2021 4.5 3.5 - 5.1 mmol/L Final  05/26/2013 4.1 3.5 - 5.1 mmol/L Final         Failed - Na in normal range and within 180 days    Sodium  Date Value Ref Range Status  05/08/2021 140 135 - 145 mmol/L Final  05/26/2013 139 136 - 145 mmol/L Final         Failed - Last BP in normal range    BP Readings from Last 1 Encounters:  10/30/22 (!) 154/98         Failed - Valid encounter within last 6 months    Recent Outpatient Visits   None

## 2023-07-09 ENCOUNTER — Ambulatory Visit: Payer: MEDICAID | Admitting: Internal Medicine

## 2023-07-14 ENCOUNTER — Ambulatory Visit (INDEPENDENT_AMBULATORY_CARE_PROVIDER_SITE_OTHER): Payer: MEDICAID | Admitting: Internal Medicine

## 2023-07-14 ENCOUNTER — Encounter: Payer: Self-pay | Admitting: Internal Medicine

## 2023-07-14 DIAGNOSIS — F330 Major depressive disorder, recurrent, mild: Secondary | ICD-10-CM | POA: Diagnosis not present

## 2023-07-14 DIAGNOSIS — K219 Gastro-esophageal reflux disease without esophagitis: Secondary | ICD-10-CM | POA: Insufficient documentation

## 2023-07-14 DIAGNOSIS — I10 Essential (primary) hypertension: Secondary | ICD-10-CM | POA: Diagnosis not present

## 2023-07-14 DIAGNOSIS — M545 Low back pain, unspecified: Secondary | ICD-10-CM | POA: Diagnosis not present

## 2023-07-14 DIAGNOSIS — G8929 Other chronic pain: Secondary | ICD-10-CM

## 2023-07-14 MED ORDER — AMLODIPINE BESYLATE 5 MG PO TABS: 5.0000 mg | ORAL_TABLET | Freq: Every day | ORAL | 0 refills | Status: DC

## 2023-07-14 MED ORDER — FLUOXETINE HCL 40 MG PO CAPS: 40.0000 mg | ORAL_CAPSULE | Freq: Every morning | ORAL | 1 refills | Status: DC

## 2023-07-14 MED ORDER — HYDROCHLOROTHIAZIDE 25 MG PO TABS: 25.0000 mg | ORAL_TABLET | Freq: Every day | ORAL | 1 refills | Status: DC

## 2023-07-14 MED ORDER — ONDANSETRON 4 MG PO TBDP: 4.0000 mg | ORAL_TABLET | Freq: Four times a day (QID) | ORAL | 2 refills | Status: DC | PRN

## 2023-07-14 MED ORDER — OMEPRAZOLE 20 MG PO CPDR: 20.0000 mg | DELAYED_RELEASE_CAPSULE | Freq: Every day | ORAL | 1 refills | Status: DC

## 2023-07-14 MED ORDER — MELOXICAM 15 MG PO TABS: 15.0000 mg | ORAL_TABLET | Freq: Every day | ORAL | 1 refills | Status: DC | PRN
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
## 2023-07-14 NOTE — Patient Instructions (Signed)
 GERD in Adults: Diet Changes When you have gastroesophageal reflux disease (GERD), you may need to make changes to your diet. Choosing the right foods can help with your symptoms. Think about working with an expert in healthy eating called a dietitian. They can help you make healthy food choices. What are tips for following this plan? Reading food labels Look for foods that are low in saturated fat. Foods that may help with your symptoms include: Foods with less than 5% of daily value (DV) of fat. Foods with 0 grams of trans fat. Cooking [employer] in ways that don't use a lot of fat. These ways include: Baking. Steaming. Grilling. Broiling. To add flavor, try to use herbs that are low in spice and acidity. Avoid frying your food. Meal planning  Eat small meals often rather than eating 3 large meals each day. Eat your meals slowly in a place where you feel relaxed. If told by your health care provider, avoid: Foods that cause symptoms. Keep a food diary to keep track of foods that cause symptoms. Alcohol. Drinking a lot of liquid with meals. General instructions For 2-3 hours after you eat, avoid: Bending over. Exercise. Lying down. Chew sugar-free gum after meals. What foods should I eat? Eat a healthy diet. Try to include: Foods with high amounts of fiber. These include: Fruits and vegetables. Whole grains and beans. Low-fat dairy products. Lean meats, fish, and poultry. Egg whites. Foods that cause symptoms in someone else may not cause symptoms for you. Work with your provider to find foods that are safe for you. The items listed above may not be all the foods and drinks you can have. Talk with a dietitian to learn more. The items listed above may not be a complete list of foods and beverages you can eat and drink. Contact a dietitian for more information. What foods should I avoid? Limiting some of these foods may help with your symptoms. Each person is different.  Talk with a dietitian or your provider to help you find the exact foods to avoid. Some of the foods to avoid may include: Fruits Fruits with a lot of acid in them. These may include citrus fruits, such as oranges, grapefruit, pineapple, and lemons. Vegetables Deep-fried vegetables, such as [place] fries. Vegetables, sauces, or toppings made with added fat and vegetables with acid in them. These may include tomatoes and tomato products, chili peppers, onions, garlic, and horseradish. Grains Pastries or quick breads with added fat. Meats and other proteins High-fat meats, such as fatty beef or pork, hot dogs, ribs, ham, sausage, salami, and bacon. Fried meat or protein, such as fried fish and fried chicken. Egg yolks. Fats and oils Butter. Margarine. Shortening. Ghee. Drinks Coffee and other drinks with caffeine in them. Fizzy and sugary drinks, such as soda and energy drinks. Fruit juice made with acidic fruits, such as orange or grapefruit. Tomato juice. Sweets and desserts Chocolate and cocoa. Donuts. Seasonings and condiments Mint, such as peppermint and spearmint. Condiments, herbs, or seasonings that cause symptoms. These may include curry, hot sauce, or vinegar-based salad dressings. The items listed above may not be all the foods and drinks you should avoid. Talk with a dietitian to learn more. Questions to ask your health care provider Changes to your diet and everyday life are often the first steps taken to manage symptoms of GERD. If these changes don't help, talk with your provider about taking medicines. Where to find more information International Foundation for Gastrointestinal Disorders:  aboutgerd.org This information is not intended to replace advice given to you by your health care provider. Make sure you discuss any questions you have with your health care provider. Document Revised: 01/14/2023 Document Reviewed: 07/31/2022 Elsevier Patient Education  2024 [employer].

## 2023-07-15 ENCOUNTER — Encounter: Payer: Self-pay | Admitting: Internal Medicine

## 2023-07-15 LAB — COMPREHENSIVE METABOLIC PANEL WITH GFR
AG Ratio: 1.1 (calc) (ref 1.0–2.5)
ALT: 115 U/L — ABNORMAL HIGH (ref 6–29)
AST: 88 U/L — ABNORMAL HIGH (ref 10–35)
Albumin: 3.9 g/dL (ref 3.6–5.1)
Alkaline phosphatase (APISO): 84 U/L (ref 37–153)
BUN/Creatinine Ratio: 17 (calc) (ref 6–22)
BUN: 22 mg/dL (ref 7–25)
CO2: 25 mmol/L (ref 20–32)
Calcium: 9.5 mg/dL (ref 8.6–10.4)
Chloride: 103 mmol/L (ref 98–110)
Creat: 1.32 mg/dL — ABNORMAL HIGH (ref 0.50–1.05)
Globulin: 3.6 g/dL (ref 1.9–3.7)
Glucose, Bld: 88 mg/dL (ref 65–139)
Potassium: 4.1 mmol/L (ref 3.5–5.3)
Sodium: 139 mmol/L (ref 135–146)
Total Bilirubin: 0.7 mg/dL (ref 0.2–1.2)
Total Protein: 7.5 g/dL (ref 6.1–8.1)
eGFR: 46 mL/min/{1.73_m2} — ABNORMAL LOW (ref >=60)

## 2023-07-22 ENCOUNTER — Telehealth: Payer: Self-pay

## 2023-07-22 NOTE — Telephone Encounter (Signed)
 Copied from CRM [id number]. Topic: Clinical - Lab/Test Results >> Jul 22, 2023  9:40 AM Stanly Early wrote: Reason for CRM: Would like a callback about recent test results at [phone]

## 2023-10-15 ENCOUNTER — Encounter: Payer: MEDICAID | Admitting: Family Medicine

## 2023-10-21 ENCOUNTER — Encounter: Payer: MEDICAID | Admitting: Family Medicine

## 2023-11-03 ENCOUNTER — Encounter: Payer: MEDICAID | Admitting: Family Medicine

## 2023-11-24 ENCOUNTER — Ambulatory Visit (INDEPENDENT_AMBULATORY_CARE_PROVIDER_SITE_OTHER): Payer: MEDICAID | Admitting: Family Medicine

## 2023-11-24 ENCOUNTER — Encounter: Payer: Self-pay | Admitting: Family Medicine

## 2023-11-24 VITALS — BP 124/80 | HR 81 | Ht 67.0 in | Wt 155.1 lb

## 2023-11-24 DIAGNOSIS — Z0000 Encounter for general adult medical examination without abnormal findings: Secondary | ICD-10-CM

## 2023-11-24 DIAGNOSIS — I10 Essential (primary) hypertension: Secondary | ICD-10-CM

## 2023-11-24 DIAGNOSIS — E785 Hyperlipidemia, unspecified: Secondary | ICD-10-CM

## 2023-11-24 DIAGNOSIS — R7309 Other abnormal glucose: Secondary | ICD-10-CM

## 2023-11-24 DIAGNOSIS — M545 Low back pain, unspecified: Secondary | ICD-10-CM | POA: Diagnosis not present

## 2023-11-24 DIAGNOSIS — G8929 Other chronic pain: Secondary | ICD-10-CM

## 2023-11-24 DIAGNOSIS — F330 Major depressive disorder, recurrent, mild: Secondary | ICD-10-CM | POA: Insufficient documentation

## 2023-11-24 DIAGNOSIS — K219 Gastro-esophageal reflux disease without esophagitis: Secondary | ICD-10-CM

## 2023-11-24 DIAGNOSIS — R7989 Other specified abnormal findings of blood chemistry: Secondary | ICD-10-CM

## 2023-11-24 DIAGNOSIS — Z1231 Encounter for screening mammogram for malignant neoplasm of breast: Secondary | ICD-10-CM

## 2023-11-24 DIAGNOSIS — Z1159 Encounter for screening for other viral diseases: Secondary | ICD-10-CM

## 2023-11-24 DIAGNOSIS — R748 Abnormal levels of other serum enzymes: Secondary | ICD-10-CM

## 2023-11-24 MED ORDER — HYDROCHLOROTHIAZIDE 25 MG PO TABS: 25.0000 mg | ORAL_TABLET | Freq: Every day | ORAL | 1 refills | Status: AC

## 2023-11-24 MED ORDER — FLUOXETINE HCL 40 MG PO CAPS: 40.0000 mg | ORAL_CAPSULE | Freq: Every morning | ORAL | 1 refills | Status: DC

## 2023-11-24 MED ORDER — AMLODIPINE BESYLATE 5 MG PO TABS: 5.0000 mg | ORAL_TABLET | Freq: Every day | ORAL | 1 refills | Status: DC

## 2023-11-24 MED ORDER — OMEPRAZOLE 40 MG PO CPDR: 40.0000 mg | DELAYED_RELEASE_CAPSULE | Freq: Every day | ORAL | 1 refills | Status: AC

## 2023-11-24 MED ORDER — ONDANSETRON 4 MG PO TBDP: 4.0000 mg | ORAL_TABLET | Freq: Three times a day (TID) | ORAL | 2 refills | Status: AC | PRN

## 2023-11-24 MED ORDER — MELOXICAM 15 MG PO TABS: 15.0000 mg | ORAL_TABLET | Freq: Every day | ORAL | 1 refills | Status: AC | PRN

## 2023-11-24 NOTE — Patient Instructions (Addendum)
 Thank you for coming to the office today.  Consider using topical.  START anti inflammatory topical - OTC Voltaren (generic Diclofenac) topical 2-4 times a day as needed for pain swelling of affected joint for 1-2 weeks or longer.  Labs today  All refilled for 90 days + 1 refill 6 months  For Mammogram screening for breast cancer   Call the Imaging Center below anytime to schedule your own appointment now that order has been placed.  [facility] [address] [place], [place] [address] Phone: [phone]  Referral to GI doctor for Colonoscopy  [facility] [address] [place], [place] [address] Hours: 8AM-5PM Phone: [phone]  Please schedule a Follow-up Appointment to: Return in about 6 months (around 05/23/2024) for 6 month follow-up HTN, updates.  If you have any other questions or concerns, please feel free to call the office or send a message through MyChart. You may also schedule an earlier appointment if necessary.  Additionally, you may be receiving a survey about your experience at our office within a few days to 1 week by e-mail or mail. We value your feedback.  Marsa Officer, DO [facility], [place]

## 2023-11-24 NOTE — Progress Notes (Signed)
 Subjective:    Patient ID: [name], female    DOB: 04/17/1960, 63 y.o.   MRN: [id number]  [name] is a 63 y.o. female presenting on 11/24/2023 for Annual Exam   HPI  Discussed the use of AI scribe software for clinical note transcription with the patient, who gave verbal consent to proceed.  History of Present Illness   [name] is a 63 year old female who presents for a routine annual physical exam. She is accompanied by her daughter, who assists with her medications.  Arthralgia and foot pain - Severe arthritis affecting the feet - Previously used meloxicam , which has run out - Currently uses over-the-counter naproxen  with symptomatic relief  GERD Gastrointestinal symptoms - Nausea and acid reflux, particularly in the mornings - Daily use of Zofran  for nausea - Uses omeprazole  for acid reflux, with inadequate symptom control - Occasional vomiting of acid - Intermittent use of over-the-counter antacids such as Pepto-Bismol  Anemia and constitutional symptoms - History of anemia - Frequent sensation of feeling cold - No recent blood work to confirm anemia - Last blood test in April showed liver enzyme abnormalities - Repeat blood panel scheduled for today  Medication management - Current medications include omeprazole , Zofran , two antihypertensive agents, and fluoxetine  - Fluoxetine  has run out - Daughter assists with medication management  Preventive health maintenance - No influenza, shingles, or pneumonia vaccines received - Due for mammogram - Last colonoscopy over five years ago; due for repeat      Chronic Left Foot Ankle Pain, history of fracture traumatic   CHRONIC HTN: Reports chronic problem, improved with medication  Current Meds - hydrochlorothiazide  25mg  daily , Amlodipine  5mg  daily Reports good compliance, did not take med today Tolerating well, w/o complaints. Denies CP, dyspnea, HA, edema, dizziness /  lightheadedness   Major Depression, recurrent Her husband passed 01/02/2022 On SSRI Fluoxetine  40mg    S/p Cataract surgery Upcoming Left eye cataract surgery  Health Maintenance: Overdue for Colonoscpy, will need referral      11/24/2023    1:43 PM 07/14/2023    1:16 PM 10/29/2022    9:49 AM  Depression screen PHQ 2/9  Decreased Interest 1 0 1  Down, Depressed, Hopeless 1 0 1  PHQ - 2 Score 2 0 2  Altered sleeping 2 0 1  Tired, decreased energy 1 0 2  Change in appetite 0 0 0  Feeling bad or failure about yourself  0 0 0  Trouble concentrating 0 0 1  Moving slowly or fidgety/restless 0 0 0  Suicidal thoughts 0 0 0  PHQ-9 Score 5 0 6  Difficult doing work/chores Not difficult at all Not difficult at all Not difficult at all       07/14/2023    1:17 PM 10/29/2022    9:49 AM  GAD 7 : Generalized Anxiety Score  Nervous, Anxious, on Edge 0 0  Control/stop worrying 0 0  Worry too much - different things 0 0  Trouble relaxing 0 0  Restless 0 0  Easily annoyed or irritable 0 0  Afraid - awful might happen 0 0  Total GAD 7 Score 0 0  Anxiety Difficulty Not difficult at all      Past Medical History:  Diagnosis Date   Depression    Hypertension    Past Surgical History:  Procedure Laterality Date   Moreno SURGERY     BREAST BIOPSY Left 2011   CORE W/CLIP   EYE SURGERY  Bilateral    glaucoma   TUBAL LIGATION     Social History   Socioeconomic History   Marital status: Widowed    Spouse name: Not on file   Number of children: Not on file   Years of education: Not on file   Highest education level: High school graduate  Occupational History   Not on file  Tobacco Use   Smoking status: Every Day    Current packs/day: 0.33    Types: Cigarettes   Smokeless tobacco: Never  Vaping Use   Vaping status: Never Used  Substance and Sexual Activity   Alcohol use: Yes    Comment: 3-4 40 oz beers per week - occasionally   Drug use: No   Sexual activity: Not  Currently  Other Topics Concern   Not on file  Social History Narrative   Not on file   Social Drivers of Health   Financial Resource Strain: Not on file  Food Insecurity: Not on file  Transportation Needs: Not on file  Physical Activity: Not on file  Stress: Not on file  Social Connections: Not on file  Intimate Partner Violence: Not on file   History reviewed. No pertinent family history. No current outpatient medications on file prior to visit.   No current facility-administered medications on file prior to visit.    Review of Systems  Constitutional:  Negative for activity change, appetite change, chills, diaphoresis, fatigue and fever.  HENT:  Negative for congestion and hearing loss.   Eyes:  Negative for visual disturbance.  Respiratory:  Negative for cough, chest tightness, shortness of breath and wheezing.   Cardiovascular:  Negative for chest pain, palpitations and leg swelling.  Gastrointestinal:  Negative for abdominal pain, constipation, diarrhea, nausea and vomiting.  Genitourinary:  Negative for dysuria, frequency and hematuria.  Musculoskeletal:  Negative for arthralgias and neck pain.  Skin:  Negative for rash.  Neurological:  Negative for dizziness, weakness, light-headedness, numbness and headaches.  Hematological:  Negative for adenopathy.  Psychiatric/Behavioral:  Negative for behavioral problems, dysphoric mood and sleep disturbance.    Per HPI unless specifically indicated above     Objective:    BP 124/80 (BP Location: Left Arm, Patient Position: Sitting, Cuff Size: Normal)   Pulse 81   Ht 5' 7 (1.702 m)   Wt 155 lb 2 oz (70.4 kg)   SpO2 98%   BMI 24.30 kg/m   Wt Readings from Last 3 Encounters:  11/24/23 155 lb 2 oz (70.4 kg)  07/14/23 154 lb 9.6 oz (70.1 kg)  10/29/22 163 lb (73.9 kg)    Physical Exam Vitals and nursing note reviewed.  Constitutional:      General: She is not in acute distress.    Appearance: She is well-developed.  She is not diaphoretic.     Comments: Well-appearing, comfortable, cooperative  HENT:     Head: Normocephalic and atraumatic.  Eyes:     General:        Right eye: No discharge.        Left eye: No discharge.     Conjunctiva/sclera: Conjunctivae normal.     Pupils: Pupils are equal, round, and reactive to light.  Neck:     Thyroid: No thyromegaly.  Cardiovascular:     Rate and Rhythm: Normal rate and regular rhythm.     Pulses: Normal pulses.     Heart sounds: Normal heart sounds. No murmur heard. Pulmonary:     Effort: Pulmonary effort is normal. No respiratory  distress.     Breath sounds: Normal breath sounds. No wheezing or rales.  Abdominal:     General: Bowel sounds are normal. There is no distension.     Palpations: Abdomen is soft. There is no mass.     Tenderness: There is no abdominal tenderness.  Musculoskeletal:        General: No tenderness. Normal range of motion.     Cervical back: Normal range of motion and neck supple.     Comments: Upper / Lower Extremities: - Normal muscle tone, strength bilateral upper extremities 5/5, lower extremities 5/5  Lymphadenopathy:     Cervical: No cervical adenopathy.  Skin:    General: Skin is warm and dry.     Findings: No erythema or rash.  Neurological:     Mental Status: She is alert and oriented to person, place, and time.     Comments: Distal sensation intact to light touch all extremities  Psychiatric:        Mood and Affect: Mood normal.        Behavior: Behavior normal.        Thought Content: Thought content normal.     Comments: Well groomed, good eye contact, normal speech and thoughts     Results for orders placed or performed in visit on 11/24/23  TSH   Collection Time: 11/24/23  1:50 PM  Result Value Ref Range   TSH 5.87 (H) 0.40 - 4.50 mIU/L  Lipid panel   Collection Time: 11/24/23  1:50 PM  Result Value Ref Range   Cholesterol 242 (H) <200 mg/dL   HDL 69 > OR = 50 mg/dL   Triglycerides 97 <849 mg/dL    LDL Cholesterol (Calc) 152 (H) mg/dL (calc)   Total CHOL/HDL Ratio 3.5 <5.0 (calc)   Non-HDL Cholesterol (Calc) 173 (H) <130 mg/dL (calc)  CBC with Differential/Platelet   Collection Time: 11/24/23  1:50 PM  Result Value Ref Range   WBC 6.0 3.8 - 10.8 Thousand/uL   RBC 4.12 3.80 - 5.10 Million/uL   Hemoglobin 13.6 11.7 - 15.5 g/dL   HCT 59.7 64.9 - 54.9 %   MCV 97.6 80.0 - 100.0 fL   MCH 33.0 27.0 - 33.0 pg   MCHC 33.8 32.0 - 36.0 g/dL   RDW 88.2 88.9 - 84.9 %   Platelets 297 140 - 400 Thousand/uL   MPV 11.0 7.5 - 12.5 fL   Neutro Abs 3,036 1,500 - 7,800 cells/uL   Absolute Lymphocytes 2,418 850 - 3,900 cells/uL   Absolute Monocytes 396 200 - 950 cells/uL   Eosinophils Absolute 102 15 - 500 cells/uL   Basophils Absolute 48 0 - 200 cells/uL   Neutrophils Relative % 50.6 %   Total Lymphocyte 40.3 %   Monocytes Relative 6.6 %   Eosinophils Relative 1.7 %   Basophils Relative 0.8 %  Comprehensive metabolic panel with GFR   Collection Time: 11/24/23  1:50 PM  Result Value Ref Range   Glucose, Bld 101 (H) 65 - 99 mg/dL   BUN 17 7 - 25 mg/dL   Creat 9.23 9.49 - 8.94 mg/dL   eGFR 88 > OR = 60 fO/fpw/8.26f7   BUN/Creatinine Ratio SEE NOTE: 6 - 22 (calc)   Sodium 136 135 - 146 mmol/L   Potassium 3.9 3.5 - 5.3 mmol/L   Chloride 101 98 - 110 mmol/L   CO2 23 20 - 32 mmol/L   Calcium 10.2 8.6 - 10.4 mg/dL   Total Protein 7.6 6.1 -  8.1 g/dL   Albumin 3.9 3.6 - 5.1 g/dL   Globulin 3.7 1.9 - 3.7 g/dL (calc)   AG Ratio 1.1 1.0 - 2.5 (calc)   Total Bilirubin 0.8 0.2 - 1.2 mg/dL   Alkaline phosphatase (APISO) 78 37 - 153 U/L   AST 90 (H) 10 - 35 U/L   ALT 74 (H) 6 - 29 U/L      Assessment & Plan:   Problem List Items Addressed This Visit     Benign essential HTN   Relevant Medications   hydrochlorothiazide  (HYDRODIURIL ) 25 MG tablet   amLODipine  (NORVASC ) 5 MG tablet   Other Relevant Orders   TSH (Completed)   CBC with Differential/Platelet (Completed)   Comprehensive  metabolic panel with GFR (Completed)   Gastroesophageal reflux disease without esophagitis   Relevant Medications   ondansetron  (ZOFRAN -ODT) 4 MG disintegrating tablet   omeprazole  (PRILOSEC) 40 MG capsule   Mild episode of recurrent major depressive disorder (HCC)   Relevant Medications   FLUoxetine  (PROZAC ) 40 MG capsule   Other Visit Diagnoses       Annual physical exam    -  Primary     Chronic bilateral low back pain without sciatica       Relevant Medications   meloxicam  (MOBIC ) 15 MG tablet   FLUoxetine  (PROZAC ) 40 MG capsule     Need for hepatitis C screening test       Relevant Orders   Hepatitis C antibody     Elevated serum creatinine       Relevant Orders   CBC with Differential/Platelet (Completed)   Comprehensive metabolic panel with GFR (Completed)     Elevated liver enzymes       Relevant Orders   CBC with Differential/Platelet (Completed)   Comprehensive metabolic panel with GFR (Completed)     Hyperlipidemia, unspecified hyperlipidemia type       Relevant Medications   hydrochlorothiazide  (HYDRODIURIL ) 25 MG tablet   amLODipine  (NORVASC ) 5 MG tablet   Other Relevant Orders   Lipid panel (Completed)     Abnormal glucose       Relevant Orders   Hemoglobin A1c     Encounter for screening mammogram for malignant neoplasm of breast       Relevant Orders   MM 3D SCREENING MAMMOGRAM BILATERAL BREAST   Ambulatory referral to Gastroenterology        Updated Health Maintenance information Reviewed recent lab results with patient Encouraged improvement to lifestyle with diet and exercise   Adult Wellness Visit Routine annual wellness visit focused on health maintenance, vaccinations, and cancer screenings. Blood work needed due to previous abnormal liver and kidney function results. - Order annual blood panel. - Discuss results and recommendations via MyChart. - Recommend flu shot, shingles vaccine, and pneumonia vaccine. - Provide information for  mammogram scheduling. - Refer to GI for colonoscopy.  Gastroesophageal reflux disease GERD with nausea and occasional vomiting, indicating suboptimal control. Current omeprazole  treatment may be insufficient. - Increase omeprazole  dosage from 20 mg to 40 mg. - Refill Zofran  for nausea. - Consider GI consultation if symptoms persist.  Essential hypertension - Continue current blood pressure medications. Hydrochlorothiazide  25mg  and amlodipine  5mg  - Refill blood pressure medications for 90 days with one refill.  Osteoarthritis of feet and ankles Chronic osteoarthritis in feet and ankles. Discussed potential kidney impact of NSAIDs and use of Voltaren topical gel as an alternative. - Refill meloxicam  with instructions for intermittent use. - Recommend Voltaren topical gel as an  alternative.  Depression Major, recurrent Depression with mood swings. - Refill fluoxetine  for mood stabilization.        Orders Placed This Encounter  Procedures   MM 3D SCREENING MAMMOGRAM BILATERAL BREAST    Standing Status:   Future    Expiration Date:   11/23/2024    Reason for Exam (SYMPTOM  OR DIAGNOSIS REQUIRED):   Screening bilateral 3D Mammogram Tomo    Preferred imaging location?:   [facility]   TSH   Lipid panel    Has the patient fasted?:   Yes   Hemoglobin A1c   CBC with Differential/Platelet   Comprehensive metabolic panel with GFR    Has the patient fasted?:   Yes   Hepatitis C antibody   Ambulatory referral to Gastroenterology    Referral Priority:   Routine    Referral Type:   Consultation    Referral Reason:   Specialty Services Required    Number of Visits Requested:   1    Meds ordered this encounter  Medications   meloxicam  (MOBIC ) 15 MG tablet    Sig: Take 1 tablet (15 mg total) by mouth daily as needed for pain.    Dispense:  90 tablet    Refill:  1   ondansetron  (ZOFRAN -ODT) 4 MG disintegrating tablet    Sig: Take 1 tablet (4 mg total) by mouth every 8 (eight)  hours as needed for nausea or vomiting.    Dispense:  30 tablet    Refill:  2   hydrochlorothiazide  (HYDRODIURIL ) 25 MG tablet    Sig: Take 1 tablet (25 mg total) by mouth daily.    Dispense:  90 tablet    Refill:  1   FLUoxetine  (PROZAC ) 40 MG capsule    Sig: Take 1 capsule (40 mg total) by mouth once daily every morning.    Dispense:  90 capsule    Refill:  1   amLODipine  (NORVASC ) 5 MG tablet    Sig: Take 1 tablet (5 mg total) by mouth daily.    Dispense:  90 tablet    Refill:  1   omeprazole  (PRILOSEC) 40 MG capsule    Sig: Take 1 capsule (40 mg total) by mouth daily before breakfast.    Dispense:  90 capsule    Refill:  1     Follow up plan: Return in about 6 months (around 05/23/2024) for 6 month follow-up HTN, updates.  Marsa Officer, DO [facility] 11/24/2023, 1:22 PM

## 2023-11-27 LAB — CBC WITH DIFFERENTIAL/PLATELET
Absolute Lymphocytes: 2418 {cells}/uL (ref 850–3900)
Absolute Monocytes: 396 {cells}/uL (ref 200–950)
Basophils Absolute: 48 {cells}/uL (ref 0–200)
Basophils Relative: 0.8 %
Eosinophils Absolute: 102 {cells}/uL (ref 15–500)
Eosinophils Relative: 1.7 %
HCT: 40.2 % (ref 35.0–45.0)
Hemoglobin: 13.6 g/dL (ref 11.7–15.5)
MCH: 33 pg (ref 27.0–33.0)
MCHC: 33.8 g/dL (ref 32.0–36.0)
MCV: 97.6 fL (ref 80.0–100.0)
MPV: 11 fL (ref 7.5–12.5)
Monocytes Relative: 6.6 %
Neutro Abs: 3036 {cells}/uL (ref 1500–7800)
Neutrophils Relative %: 50.6 %
Platelets: 297 Thousand/uL (ref 140–400)
RBC: 4.12 Million/uL (ref 3.80–5.10)
RDW: 11.7 % (ref 11.0–15.0)
Total Lymphocyte: 40.3 %
WBC: 6 Thousand/uL (ref 3.8–10.8)

## 2023-11-27 LAB — HEMOGLOBIN A1C
Hgb A1c MFr Bld: 5.4 % (ref <5.7)
Mean Plasma Glucose: 108 mg/dL
eAG (mmol/L): 6 mmol/L

## 2023-11-27 LAB — COMPREHENSIVE METABOLIC PANEL WITH GFR
AG Ratio: 1.1 (calc) (ref 1.0–2.5)
ALT: 74 U/L — ABNORMAL HIGH (ref 6–29)
AST: 90 U/L — ABNORMAL HIGH (ref 10–35)
Albumin: 3.9 g/dL (ref 3.6–5.1)
Alkaline phosphatase (APISO): 78 U/L (ref 37–153)
BUN: 17 mg/dL (ref 7–25)
CO2: 23 mmol/L (ref 20–32)
Calcium: 10.2 mg/dL (ref 8.6–10.4)
Chloride: 101 mmol/L (ref 98–110)
Creat: 0.76 mg/dL (ref 0.50–1.05)
Globulin: 3.7 g/dL (ref 1.9–3.7)
Glucose, Bld: 101 mg/dL — ABNORMAL HIGH (ref 65–99)
Potassium: 3.9 mmol/L (ref 3.5–5.3)
Sodium: 136 mmol/L (ref 135–146)
Total Bilirubin: 0.8 mg/dL (ref 0.2–1.2)
Total Protein: 7.6 g/dL (ref 6.1–8.1)
eGFR: 88 mL/min/1.73m2 (ref >=60)

## 2023-11-27 LAB — LIPID PANEL
Cholesterol: 242 mg/dL — ABNORMAL HIGH (ref <200)
HDL: 69 mg/dL (ref >=50)
LDL Cholesterol (Calc): 152 mg/dL — ABNORMAL HIGH
Non-HDL Cholesterol (Calc): 173 mg/dL — ABNORMAL HIGH (ref <130)
Total CHOL/HDL Ratio: 3.5 (calc) (ref <5.0)
Triglycerides: 97 mg/dL (ref <150)

## 2023-11-27 LAB — HEPATITIS C ANTIBODY: Hepatitis C Ab: REACTIVE — AB

## 2023-11-27 LAB — HEPATITIS C RNA QUANTITATIVE
HCV Quantitative Log: 6.61 {Log_IU}/mL — ABNORMAL HIGH
HCV RNA, PCR, QN: 4040000 [IU]/mL — ABNORMAL HIGH

## 2023-11-27 LAB — TSH: TSH: 5.87 m[IU]/L — ABNORMAL HIGH (ref 0.40–4.50)

## 2023-11-28 ENCOUNTER — Ambulatory Visit: Payer: Self-pay | Admitting: Family Medicine

## 2023-11-28 ENCOUNTER — Other Ambulatory Visit: Payer: Self-pay | Admitting: Family Medicine

## 2023-11-28 DIAGNOSIS — B182 Chronic viral hepatitis C: Secondary | ICD-10-CM

## 2023-12-02 ENCOUNTER — Encounter: Payer: Self-pay | Admitting: Family Medicine

## 2023-12-02 DIAGNOSIS — B182 Chronic viral hepatitis C: Secondary | ICD-10-CM | POA: Insufficient documentation

## 2023-12-16 ENCOUNTER — Other Ambulatory Visit
Admission: RE | Admit: 2023-12-16 | Discharge: 2023-12-16 | Disposition: A | Discharge status: Home / Self Care | Payer: MEDICAID | Source: Ambulatory Visit | Attending: Infectious Diseases | Admitting: Infectious Diseases

## 2023-12-16 ENCOUNTER — Encounter: Payer: Self-pay | Admitting: Infectious Diseases

## 2023-12-16 ENCOUNTER — Ambulatory Visit: Payer: MEDICAID | Attending: Infectious Diseases | Admitting: Infectious Diseases

## 2023-12-16 VITALS — BP 186/98 | HR 76 | Temp 97.6°F | Ht 67.0 in | Wt 151.0 lb

## 2023-12-16 DIAGNOSIS — F17210 Nicotine dependence, cigarettes, uncomplicated: Secondary | ICD-10-CM | POA: Insufficient documentation

## 2023-12-16 DIAGNOSIS — F32A Depression, unspecified: Secondary | ICD-10-CM | POA: Insufficient documentation

## 2023-12-16 DIAGNOSIS — Z79899 Other long term (current) drug therapy: Secondary | ICD-10-CM | POA: Diagnosis not present

## 2023-12-16 DIAGNOSIS — F1290 Cannabis use, unspecified, uncomplicated: Secondary | ICD-10-CM | POA: Diagnosis not present

## 2023-12-16 DIAGNOSIS — F1090 Alcohol use, unspecified, uncomplicated: Secondary | ICD-10-CM

## 2023-12-16 DIAGNOSIS — B182 Chronic viral hepatitis C: Secondary | ICD-10-CM | POA: Insufficient documentation

## 2023-12-16 DIAGNOSIS — G8921 Chronic pain due to trauma: Secondary | ICD-10-CM

## 2023-12-16 DIAGNOSIS — K219 Gastro-esophageal reflux disease without esophagitis: Secondary | ICD-10-CM | POA: Diagnosis not present

## 2023-12-16 DIAGNOSIS — F1490 Cocaine use, unspecified, uncomplicated: Secondary | ICD-10-CM | POA: Insufficient documentation

## 2023-12-16 DIAGNOSIS — I10 Essential (primary) hypertension: Secondary | ICD-10-CM | POA: Insufficient documentation

## 2023-12-16 DIAGNOSIS — M79673 Pain in unspecified foot: Secondary | ICD-10-CM | POA: Insufficient documentation

## 2023-12-16 DIAGNOSIS — F1010 Alcohol abuse, uncomplicated: Secondary | ICD-10-CM | POA: Insufficient documentation

## 2023-12-16 DIAGNOSIS — G8929 Other chronic pain: Secondary | ICD-10-CM | POA: Insufficient documentation

## 2023-12-16 LAB — HEPATITIS C ANTIBODY: HCV Ab: REACTIVE — AB

## 2023-12-16 LAB — APTT: aPTT: 28 s (ref 24–36)

## 2023-12-16 LAB — HIV ANTIBODY (ROUTINE TESTING W REFLEX): HIV Screen 4th Generation wRfx: NONREACTIVE

## 2023-12-16 LAB — HEPATITIS B SURFACE ANTIGEN: Hepatitis B Surface Ag: NONREACTIVE
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
## 2023-12-17 LAB — AFP TUMOR MARKER: AFP, Serum, Tumor Marker: 4.6 ng/mL (ref 0.0–9.2)

## 2023-12-17 LAB — HEPATITIS B SURFACE ANTIBODY, QUANTITATIVE: Hep B S AB Quant (Post): <3.5 m[IU]/mL — ABNORMAL LOW

## 2023-12-17 LAB — HEPATITIS B CORE ANTIBODY, TOTAL: HEP B CORE AB: NEGATIVE

## 2023-12-18 ENCOUNTER — Other Ambulatory Visit: Payer: Self-pay

## 2023-12-18 ENCOUNTER — Other Ambulatory Visit: Payer: Self-pay | Admitting: Pharmacist

## 2023-12-18 ENCOUNTER — Ambulatory Visit: Payer: Self-pay | Admitting: Pharmacist

## 2023-12-18 ENCOUNTER — Other Ambulatory Visit: Payer: Self-pay | Admitting: Infectious Diseases

## 2023-12-18 ENCOUNTER — Other Ambulatory Visit (HOSPITAL_COMMUNITY): Payer: Self-pay

## 2023-12-18 LAB — HCV FIBROSURE
ALPHA 2-MACROGLOBULINS, QN: 418 mg/dL — ABNORMAL HIGH (ref 110–276)
ALT (SGPT) P5P: 101 IU/L — ABNORMAL HIGH (ref 0–40)
Apolipoprotein A-1: 217 mg/dL — ABNORMAL HIGH (ref 116–209)
Bilirubin, Total: 0.3 mg/dL (ref 0.0–1.2)
Fibrosis Score: 0.53 — ABNORMAL HIGH (ref 0.00–0.21)
GGT: 196 IU/L — ABNORMAL HIGH (ref 0–60)
Haptoglobin: 82 mg/dL (ref 37–355)
Necroinflammat Activity Score: 0.65 — ABNORMAL HIGH (ref 0.00–0.17)

## 2023-12-18 MED ORDER — SOFOSBUVIR-VELPATASVIR 400-100 MG PO TABS
1.0000 | ORAL_TABLET | Freq: Every day | ORAL | 2 refills | Status: AC
  Filled 2023-12-18: qty 28, 28d supply, fill #0
  Filled 2024-01-14: qty 28, 28d supply, fill #1
  Filled 2024-02-09: qty 28, 28d supply, fill #2

## 2023-12-18 NOTE — Progress Notes (Signed)
 She does take omeprazole  every day, so they will space them out appropriately to avoid the interaction. She'll get the medicine mailed on Tuesday!

## 2023-12-18 NOTE — Progress Notes (Signed)
 Specialty Pharmacy Initiation Note   [name] is a 63 y.o. female who will be followed by the specialty pharmacy service for RxSp Hepatitis C    Review of administration and missed dose instructions occurred today for patient's specialty medication(s) Sofosbuvir-Velpatasvir     Patient/Caregiver did not have any additional questions or concerns.   Patient's therapy is appropriate to: Initiate    Goals Addressed             This Visit's Progress    Achieve virologic cure as evidenced by SVR       Patient is initiating therapy. Patient will be evaluated at upcoming provider appointment to assess progress      Comply with lab assessments       Patient is on track. Patient will adhere to provider and/or lab appointments      Maintain optimal adherence to therapy       Patient is initiating therapy. Patient will be evaluated at upcoming provider appointment to assess progress         [name] Specialty Pharmacist

## 2023-12-18 NOTE — Progress Notes (Signed)
 Patient is approved to receive Epclusa x 12 weeks for chronic Hepatitis C infection. Counseled patient to take Epclusa daily with or without food. Encouraged patient not to miss any doses and explained how their chance of cure could go down with each dose missed. Counseled patient on what to do if dose is missed - if it is closer to the missed dose take immediately; if closer to next dose skip dose and take the next dose at the usual time. Counseled patient on common side effects such as headache, fatigue, and nausea and that these normally decrease with time.   I reviewed patient medications and found one drug interaction with omeprazole . She will take omeprazole  in the morning with breakfast and Epclusa in the evening with dinner. Discussed with patient that there are several drug interactions including acid suppressants. Instructed patient to call clinic if she wishes to start a new medication during course of therapy. Also advised patient to call if she experiences any side effects. Patient will follow-up with Dr. Fayette.  Alan Geralds, PharmD, CPP, BCIDP, AAHIVP Clinical Pharmacist Practitioner Infectious Diseases Clinical Pharmacist [facility]

## 2023-12-18 NOTE — Progress Notes (Signed)
 Specialty Pharmacy Initial Fill Coordination Note  [name] is a 63 y.o. female contacted today regarding initial fill of specialty medication(s) Sofosbuvir-Velpatasvir   Patient requested Delivery   Delivery date: 12/23/23   Verified address: [address] [name] [address]   Medication will be filled on 12/22/23.   Patient is aware of 4.00 copayment.  Put on ar/acct

## 2023-12-19 ENCOUNTER — Other Ambulatory Visit: Payer: Self-pay

## 2023-12-19 LAB — HEPATITIS C GENOTYPE

## 2023-12-22 ENCOUNTER — Other Ambulatory Visit (HOSPITAL_COMMUNITY): Payer: Self-pay

## 2023-12-22 ENCOUNTER — Ambulatory Visit: Payer: MEDICAID

## 2023-12-22 ENCOUNTER — Other Ambulatory Visit: Payer: Self-pay

## 2023-12-23 ENCOUNTER — Other Ambulatory Visit (HOSPITAL_COMMUNITY): Payer: Self-pay

## 2023-12-23 NOTE — Progress Notes (Signed)
 It did get mailed out yesterday, so she should receive it today to start.

## 2024-01-12 ENCOUNTER — Other Ambulatory Visit (HOSPITAL_COMMUNITY): Payer: Self-pay

## 2024-01-14 ENCOUNTER — Other Ambulatory Visit: Payer: Self-pay

## 2024-01-14 NOTE — Progress Notes (Signed)
 Specialty Pharmacy Refill Coordination Note  [name] is a 63 y.o. female contacted today regarding refills of specialty medication(s) Sofosbuvir-Velpatasvir   Patient requested Delivery   Delivery date: 01/19/24   Verified address: [address] [name] [address]   Medication will be filled on: 01/16/24

## 2024-01-15 ENCOUNTER — Other Ambulatory Visit: Payer: Self-pay

## 2024-02-04 ENCOUNTER — Other Ambulatory Visit (HOSPITAL_COMMUNITY): Payer: Self-pay

## 2024-02-09 ENCOUNTER — Other Ambulatory Visit: Payer: Self-pay

## 2024-02-09 NOTE — Progress Notes (Signed)
 Specialty Pharmacy Refill Coordination Note  [name] is a 63 y.o. female contacted today regarding refills of specialty medication(s) Sofosbuvir -Velpatasvir    Patient requested Delivery   Delivery date: 02/11/24   Verified address: [address] [name] [address]   Medication will be filled on: 02/10/24

## 2024-03-02 ENCOUNTER — Ambulatory Visit: Payer: Self-pay

## 2024-03-02 NOTE — Telephone Encounter (Signed)
 Okay to call daughter to schedule follow-up next available within 1-2 weeks.  She will likely need Psychiatry referral.  Marsa Officer, DO [facility] 03/02/2024, 5:52 PM

## 2024-03-02 NOTE — Telephone Encounter (Signed)
 FYI Only or Action Required?: Action required by provider: request for appointment.  Patient was last seen in primary care on 11/24/2023 by Edman Marsa PARAS, DO.  Called Nurse Triage reporting Manic Behavior.  Symptoms began several days ago.  Interventions attempted: Nothing.  Symptoms are: gradually worsening.  Triage Disposition: See Physician Within 24 Hours  Patient/caregiver understands and will follow disposition?:   Copied from CRM #[id number]. Topic: Clinical - Red Word Triage >> Mar 02, 2024 12:54 PM Willma R wrote: [facility] that prompted transfer to Nurse Triage: Patients daughter thinks her mental health needs evaluated. States patients behavior has become erratic and will go off the wall easily Reason for Disposition  [1] Bipolar disorder (manic depression) AND [2] getting worse (e.g., thinking less clearly, more agitated, less able to do activities of daily living)  Answer Assessment - Initial Assessment Questions Offered appt with alt prov, pt's daughter declines due to transportation issues.  Pt's daughter reports patient is getting worse. Requesting appt next week, Monday or Tuesday due to transportation.  Patient does not have a phone and will not make an appt. Contact daughter for appt.  Advised call back or [id number]/ED/911 if symptoms worsens.  Patient's duaghter reports: 1. CONCERN: What happened that made you call today?  Pt's daughter reports:   Ppl she's staying with can't get enough sleep, staying with her boyfriend, not taking blue pill. She is okay right now, she's safe. She is currently in alcohol and anger management class right now.  2. BIPOLAR SYMPTOM SCREENING: How are you feeling overall, is your bipolar disorder under good control?     She will not come to appt if she knows, she will flip and will not go to appt if it's related to her head. She does not have a phone and if you talk to her she will know, I make her appts. 3. RISK OF HARM -  SUICIDAL IDEATION:  Do you ever have thoughts of hurting or killing yourself?  (e.g., yes, no, no but preoccupation with thoughts about death)     no 4. RISK OF HARM - HOMICIDAL IDEATION:  Do you ever have thoughts of hurting or killing someone else?  (e.g., yes, no, no but preoccupation with thoughts about death)     no 5. FUNCTIONAL IMPAIRMENT: How have things been going for you overall? Have you had more difficulty than usual doing your normal daily activities?  (e.g., better, same, worse; self-care, school, work, interactions)     Getting worse, she does not think she needs help  9. ALCOHOL USE OR SUBSTANCE USE (DRUG USE): Do you drink alcohol or use any illegal drugs?     Unsure if doing, has history 10. OTHER: Do you have any other physical symptoms right now? (e.g., fever) denies  Protocols used: Bipolar Disorder (Manic Depression)-A-AH

## 2024-03-03 NOTE — Telephone Encounter (Signed)
Spoke to daughter appointment scheduled.

## 2024-03-06 ENCOUNTER — Emergency Department
Admission: EM | Admit: 2024-03-06 | Discharge: 2024-03-06 | Disposition: A | Discharge status: Home / Self Care | Payer: MEDICAID | Attending: Emergency Medicine | Admitting: Emergency Medicine

## 2024-03-06 ENCOUNTER — Other Ambulatory Visit: Payer: Self-pay

## 2024-03-06 DIAGNOSIS — I10 Essential (primary) hypertension: Secondary | ICD-10-CM | POA: Diagnosis not present

## 2024-03-06 DIAGNOSIS — G8929 Other chronic pain: Secondary | ICD-10-CM | POA: Insufficient documentation

## 2024-03-06 DIAGNOSIS — F17200 Nicotine dependence, unspecified, uncomplicated: Secondary | ICD-10-CM | POA: Diagnosis not present

## 2024-03-06 DIAGNOSIS — M79605 Pain in left leg: Secondary | ICD-10-CM | POA: Diagnosis present

## 2024-03-06 MED ORDER — ACETAMINOPHEN 325 MG PO TABS
650.0000 mg | ORAL_TABLET | Freq: Once | ORAL | Status: AC
  Administered 2024-03-06: 650 mg via ORAL
  Filled 2024-03-06: qty 2

## 2024-03-06 NOTE — ED Notes (Signed)
 Pt provided a box meal, beverages, and a bus pass.

## 2024-03-06 NOTE — Discharge Instructions (Signed)
 Please follow-up with your outpatient provider.  Please return for any new, worsening, or changing symptoms or other concerns.  It was a pleasure caring for you today.
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
## 2024-03-06 NOTE — ED Triage Notes (Signed)
 Pt presents for generalized chronic pain and leg pain. Denies known injuries. PT was found by PD walking around a facility she did not live in. She requested to come to ED for chronic issues and housing

## 2024-03-15 ENCOUNTER — Other Ambulatory Visit: Payer: Self-pay

## 2024-03-15 ENCOUNTER — Emergency Department
Admission: EM | Admit: 2024-03-15 | Discharge: 2024-03-15 | Disposition: A | Discharge status: Home / Self Care | Payer: MEDICAID | Attending: Emergency Medicine | Admitting: Emergency Medicine

## 2024-03-15 DIAGNOSIS — M79605 Pain in left leg: Secondary | ICD-10-CM | POA: Diagnosis present

## 2024-03-15 DIAGNOSIS — I10 Essential (primary) hypertension: Secondary | ICD-10-CM | POA: Diagnosis not present

## 2024-03-15 DIAGNOSIS — G894 Chronic pain syndrome: Secondary | ICD-10-CM | POA: Diagnosis not present

## 2024-03-15 LAB — CBC
HCT: 35.7 % — ABNORMAL LOW (ref 36.0–46.0)
Hemoglobin: 12 g/dL (ref 12.0–15.0)
MCH: 32.8 pg (ref 26.0–34.0)
MCHC: 33.6 g/dL (ref 30.0–36.0)
MCV: 97.5 fL (ref 80.0–100.0)
Platelets: 281 K/uL (ref 150–400)
RBC: 3.66 MIL/uL — ABNORMAL LOW (ref 3.87–5.11)
RDW: 12.3 % (ref 11.5–15.5)
WBC: 6.4 K/uL (ref 4.0–10.5)
nRBC: 0 % (ref 0.0–0.2)

## 2024-03-15 LAB — COMPREHENSIVE METABOLIC PANEL WITH GFR
ALT: 31 U/L (ref 0–44)
AST: 36 U/L (ref 15–41)
Albumin: 4.1 g/dL (ref 3.5–5.0)
Alkaline Phosphatase: 68 U/L (ref 38–126)
Anion gap: 13 (ref 5–15)
BUN: 13 mg/dL (ref 8–23)
CO2: 24 mmol/L (ref 22–32)
Calcium: 9.2 mg/dL (ref 8.9–10.3)
Chloride: 97 mmol/L — ABNORMAL LOW (ref 98–111)
Creatinine, Ser: 0.8 mg/dL (ref 0.44–1.00)
GFR, Estimated: >60 mL/min
Glucose, Bld: 95 mg/dL (ref 70–99)
Potassium: 3.2 mmol/L — ABNORMAL LOW (ref 3.5–5.1)
Sodium: 134 mmol/L — ABNORMAL LOW (ref 135–145)
Total Bilirubin: 0.6 mg/dL (ref 0.0–1.2)
Total Protein: 7.3 g/dL (ref 6.5–8.1)

## 2024-03-15 LAB — LIPASE, BLOOD: Lipase: 44 U/L (ref 11–51)

## 2024-03-15 LAB — URINALYSIS, ROUTINE W REFLEX MICROSCOPIC
Bilirubin Urine: NEGATIVE
Glucose, UA: NEGATIVE mg/dL
Hgb urine dipstick: NEGATIVE
Ketones, ur: NEGATIVE mg/dL
Leukocytes,Ua: NEGATIVE
Nitrite: NEGATIVE
Protein, ur: NEGATIVE mg/dL
Specific Gravity, Urine: 1.012 (ref 1.005–1.030)
pH: 6 (ref 5.0–8.0)

## 2024-03-15 MED ORDER — NAPROXEN 500 MG PO TABS
500.0000 mg | ORAL_TABLET | Freq: Once | ORAL | Status: AC
  Administered 2024-03-15: 500 mg via ORAL
  Filled 2024-03-15: qty 1

## 2024-03-15 MED ORDER — HYDROCHLOROTHIAZIDE 25 MG PO TABS
25.0000 mg | ORAL_TABLET | Freq: Once | ORAL | Status: AC
  Administered 2024-03-15: 25 mg via ORAL
  Filled 2024-03-15: qty 1

## 2024-03-15 MED ORDER — ACETAMINOPHEN 500 MG PO TABS
1000.0000 mg | ORAL_TABLET | Freq: Once | ORAL | Status: AC
  Administered 2024-03-15: 1000 mg via ORAL
  Filled 2024-03-15: qty 2

## 2024-03-15 MED ORDER — AMLODIPINE BESYLATE 5 MG PO TABS
5.0000 mg | ORAL_TABLET | Freq: Once | ORAL | Status: AC
  Administered 2024-03-15: 5 mg via ORAL
  Filled 2024-03-15: qty 1

## 2024-03-15 MED ORDER — CLONIDINE HCL 0.1 MG PO TABS
0.1000 mg | ORAL_TABLET | Freq: Once | ORAL | Status: AC
  Administered 2024-03-15: 0.1 mg via ORAL
  Filled 2024-03-15: qty 1
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
## 2024-03-15 NOTE — Discharge Instructions (Signed)
 Use Tylenol  for pain and fevers.  Up to 1000 mg per dose, up to 4 times per day.  Do not take more than 4000 mg of Tylenol /acetaminophen  within 24 hours..  Use naproxen /Aleve  for anti-inflammatory pain relief. Use up to 500mg  every 12 hours. Do not take more frequently than this. Do not use other NSAIDs (ibuprofen, Advil) while taking this medication. It is safe to take Tylenol  with this.

## 2024-03-15 NOTE — ED Triage Notes (Addendum)
 Pt comes with left leg pain that started today. Pt states pain and side pain. Pt states no N/V/D.   Family concerned pt may have taken too much of her meds and pt acting different.

## 2024-03-16 ENCOUNTER — Ambulatory Visit: Payer: MEDICAID | Admitting: Family Medicine

## 2024-03-25 ENCOUNTER — Encounter: Payer: Self-pay | Admitting: Emergency Medicine

## 2024-03-25 ENCOUNTER — Emergency Department: Payer: MEDICAID

## 2024-03-25 ENCOUNTER — Encounter: Payer: Self-pay | Admitting: Child & Adolescent Psychiatry

## 2024-03-25 ENCOUNTER — Inpatient Hospital Stay
Admission: RE | Admit: 2024-03-25 | Discharge: 2024-04-10 | DRG: 885 | Disposition: A | Discharge status: Home / Self Care | Payer: MEDICAID | Source: Intra-hospital | Attending: Psychiatry | Admitting: Psychiatry

## 2024-03-25 ENCOUNTER — Emergency Department
Admission: EM | Admit: 2024-03-25 | Discharge: 2024-03-25 | Disposition: A | Discharge status: Other Acute Inpatient Hospital | Payer: MEDICAID

## 2024-03-25 ENCOUNTER — Other Ambulatory Visit: Payer: Self-pay

## 2024-03-25 DIAGNOSIS — F0393 Unspecified dementia, unspecified severity, with mood disturbance: Secondary | ICD-10-CM | POA: Diagnosis present

## 2024-03-25 DIAGNOSIS — I10 Essential (primary) hypertension: Secondary | ICD-10-CM | POA: Diagnosis present

## 2024-03-25 DIAGNOSIS — Z79899 Other long term (current) drug therapy: Secondary | ICD-10-CM | POA: Insufficient documentation

## 2024-03-25 DIAGNOSIS — R0602 Shortness of breath: Secondary | ICD-10-CM | POA: Diagnosis present

## 2024-03-25 DIAGNOSIS — F332 Major depressive disorder, recurrent severe without psychotic features: Secondary | ICD-10-CM | POA: Diagnosis not present

## 2024-03-25 DIAGNOSIS — K219 Gastro-esophageal reflux disease without esophagitis: Secondary | ICD-10-CM | POA: Diagnosis present

## 2024-03-25 DIAGNOSIS — F05 Delirium due to known physiological condition: Secondary | ICD-10-CM | POA: Diagnosis present

## 2024-03-25 DIAGNOSIS — Z791 Long term (current) use of non-steroidal anti-inflammatories (NSAID): Secondary | ICD-10-CM

## 2024-03-25 DIAGNOSIS — Z91148 Patient's other noncompliance with medication regimen for other reason: Secondary | ICD-10-CM | POA: Diagnosis not present

## 2024-03-25 DIAGNOSIS — Z888 Allergy status to other drugs, medicaments and biological substances status: Secondary | ICD-10-CM | POA: Diagnosis not present

## 2024-03-25 DIAGNOSIS — F29 Unspecified psychosis not due to a substance or known physiological condition: Secondary | ICD-10-CM | POA: Diagnosis not present

## 2024-03-25 DIAGNOSIS — Z1152 Encounter for screening for COVID-19: Secondary | ICD-10-CM

## 2024-03-25 DIAGNOSIS — I160 Hypertensive urgency: Secondary | ICD-10-CM | POA: Diagnosis not present

## 2024-03-25 DIAGNOSIS — F4329 Adjustment disorder with other symptoms: Secondary | ICD-10-CM | POA: Diagnosis present

## 2024-03-25 DIAGNOSIS — F17210 Nicotine dependence, cigarettes, uncomplicated: Secondary | ICD-10-CM | POA: Diagnosis present

## 2024-03-25 DIAGNOSIS — R0609 Other forms of dyspnea: Secondary | ICD-10-CM | POA: Diagnosis not present

## 2024-03-25 DIAGNOSIS — F333 Major depressive disorder, recurrent, severe with psychotic symptoms: Secondary | ICD-10-CM | POA: Diagnosis present

## 2024-03-25 DIAGNOSIS — F330 Major depressive disorder, recurrent, mild: Principal | ICD-10-CM

## 2024-03-25 LAB — COMPREHENSIVE METABOLIC PANEL WITH GFR
ALT: 40 U/L (ref 0–44)
AST: 41 U/L (ref 15–41)
Albumin: 3.9 g/dL (ref 3.5–5.0)
Alkaline Phosphatase: 81 U/L (ref 38–126)
Anion gap: 11 (ref 5–15)
BUN: 10 mg/dL (ref 8–23)
CO2: 24 mmol/L (ref 22–32)
Calcium: 9.3 mg/dL (ref 8.9–10.3)
Chloride: 101 mmol/L (ref 98–111)
Creatinine, Ser: 0.64 mg/dL (ref 0.44–1.00)
GFR, Estimated: >60 mL/min
Glucose, Bld: 90 mg/dL (ref 70–99)
Potassium: 3.8 mmol/L (ref 3.5–5.1)
Sodium: 136 mmol/L (ref 135–145)
Total Bilirubin: 0.4 mg/dL (ref 0.0–1.2)
Total Protein: 7.2 g/dL (ref 6.5–8.1)

## 2024-03-25 LAB — CBC WITH DIFFERENTIAL/PLATELET
Abs Immature Granulocytes: 0.02 K/uL (ref 0.00–0.07)
Basophils Absolute: 0.1 K/uL (ref 0.0–0.1)
Basophils Relative: 1 %
Eosinophils Absolute: 0.1 K/uL (ref 0.0–0.5)
Eosinophils Relative: 1 %
HCT: 38.8 % (ref 36.0–46.0)
Hemoglobin: 12.9 g/dL (ref 12.0–15.0)
Immature Granulocytes: 0 %
Lymphocytes Relative: 38 %
Lymphs Abs: 2.2 K/uL (ref 0.7–4.0)
MCH: 32.3 pg (ref 26.0–34.0)
MCHC: 33.2 g/dL (ref 30.0–36.0)
MCV: 97.2 fL (ref 80.0–100.0)
Monocytes Absolute: 0.5 K/uL (ref 0.1–1.0)
Monocytes Relative: 9 %
Neutro Abs: 2.9 K/uL (ref 1.7–7.7)
Neutrophils Relative %: 51 %
Platelets: 239 K/uL (ref 150–400)
RBC: 3.99 MIL/uL (ref 3.87–5.11)
RDW: 12.7 % (ref 11.5–15.5)
WBC: 5.6 K/uL (ref 4.0–10.5)
nRBC: 0 % (ref 0.0–0.2)

## 2024-03-25 LAB — URINE DRUG SCREEN
Amphetamines: NEGATIVE
Barbiturates: NEGATIVE
Benzodiazepines: NEGATIVE
Cocaine: NEGATIVE
Fentanyl: NEGATIVE
Methadone Scn, Ur: NEGATIVE
Opiates: NEGATIVE
Tetrahydrocannabinol: NEGATIVE

## 2024-03-25 LAB — URINALYSIS, COMPLETE (UACMP) WITH MICROSCOPIC
Bilirubin Urine: NEGATIVE
Glucose, UA: NEGATIVE mg/dL
Ketones, ur: NEGATIVE mg/dL
Nitrite: NEGATIVE
Protein, ur: NEGATIVE mg/dL
Specific Gravity, Urine: 1.004 — ABNORMAL LOW (ref 1.005–1.030)
pH: 8 (ref 5.0–8.0)

## 2024-03-25 LAB — RESP PANEL BY RT-PCR (RSV, FLU A&B, COVID)  RVPGX2
Influenza A by PCR: NEGATIVE
Influenza B by PCR: NEGATIVE
Resp Syncytial Virus by PCR: NEGATIVE
SARS Coronavirus 2 by RT PCR: NEGATIVE

## 2024-03-25 LAB — ETHANOL: Alcohol, Ethyl (B): <15 mg/dL

## 2024-03-25 LAB — TROPONIN T, HIGH SENSITIVITY
Troponin T High Sensitivity: <15 ng/L (ref 0–19)
Troponin T High Sensitivity: <15 ng/L (ref 0–19)

## 2024-03-25 LAB — PRO BRAIN NATRIURETIC PEPTIDE: Pro Brain Natriuretic Peptide: 375 pg/mL — ABNORMAL HIGH

## 2024-03-25 LAB — LIPASE, BLOOD: Lipase: 37 U/L (ref 11–51)

## 2024-03-25 MED ORDER — SOFOSBUVIR-VELPATASVIR 400-100 MG PO TABS: 1.0000 | ORAL_TABLET | Freq: Every day | ORAL | Status: DC

## 2024-03-25 MED ORDER — PANTOPRAZOLE SODIUM 40 MG PO TBEC: 40.0000 mg | DELAYED_RELEASE_TABLET | Freq: Every day | ORAL | Status: DC

## 2024-03-25 MED ORDER — CEPHALEXIN 500 MG PO CAPS
500.0000 mg | ORAL_CAPSULE | Freq: Four times a day (QID) | ORAL | Status: AC
  Administered 2024-03-25 – 2024-03-26 (×3): 500 mg via ORAL
  Filled 2024-03-25 (×3): qty 1

## 2024-03-25 MED ORDER — AMLODIPINE BESYLATE 5 MG PO TABS
5.0000 mg | ORAL_TABLET | Freq: Once | ORAL | Status: AC
  Administered 2024-03-25: 5 mg via ORAL
  Filled 2024-03-25: qty 1

## 2024-03-25 MED ORDER — AMLODIPINE BESYLATE 5 MG PO TABS
10.0000 mg | ORAL_TABLET | Freq: Every day | ORAL | Status: DC
  Administered 2024-03-26 – 2024-04-10 (×16): 10 mg via ORAL
  Filled 2024-03-25 (×16): qty 2

## 2024-03-25 MED ORDER — ALUM & MAG HYDROXIDE-SIMETH 200-200-20 MG/5ML PO SUSP
30.0000 mL | ORAL | Status: DC | PRN
  Administered 2024-03-28 – 2024-04-05 (×2): 30 mL via ORAL
  Filled 2024-03-25 (×2): qty 30

## 2024-03-25 MED ORDER — NITROGLYCERIN 0.4 MG SL SUBL: 0.4000 mg | SUBLINGUAL_TABLET | SUBLINGUAL | Status: DC | PRN

## 2024-03-25 MED ORDER — MAGNESIUM HYDROXIDE 400 MG/5ML PO SUSP: 30.0000 mL | Freq: Every day | ORAL | Status: DC | PRN

## 2024-03-25 MED ORDER — ALBUTEROL SULFATE (2.5 MG/3ML) 0.083% IN NEBU: 3.0000 mL | INHALATION_SOLUTION | RESPIRATORY_TRACT | Status: DC | PRN

## 2024-03-25 MED ORDER — NICOTINE 21 MG/24HR TD PT24: 21.0000 mg | MEDICATED_PATCH | Freq: Every day | TRANSDERMAL | Status: DC

## 2024-03-25 MED ORDER — ACETAMINOPHEN 325 MG PO TABS: 650.0000 mg | ORAL_TABLET | ORAL | Status: DC | PRN

## 2024-03-25 MED ORDER — ACETAMINOPHEN 325 MG PO TABS: 650.0000 mg | ORAL_TABLET | Freq: Four times a day (QID) | ORAL | Status: DC | PRN

## 2024-03-25 MED ORDER — AMLODIPINE BESYLATE 5 MG PO TABS: 10.0000 mg | ORAL_TABLET | Freq: Every day | ORAL | Status: DC

## 2024-03-25 MED ORDER — HALOPERIDOL 5 MG PO TABS: 5.0000 mg | ORAL_TABLET | Freq: Three times a day (TID) | ORAL | Status: DC | PRN

## 2024-03-25 MED ORDER — HYDROCHLOROTHIAZIDE 25 MG PO TABS
25.0000 mg | ORAL_TABLET | Freq: Once | ORAL | Status: AC
  Administered 2024-03-25: 25 mg via ORAL
  Filled 2024-03-25: qty 1

## 2024-03-25 MED ORDER — DIPHENHYDRAMINE HCL 25 MG PO CAPS: 50.0000 mg | ORAL_CAPSULE | Freq: Three times a day (TID) | ORAL | Status: DC | PRN

## 2024-03-25 MED ORDER — PANTOPRAZOLE SODIUM 40 MG PO TBEC
40.0000 mg | DELAYED_RELEASE_TABLET | Freq: Every day | ORAL | Status: DC
  Administered 2024-03-26 – 2024-04-10 (×16): 40 mg via ORAL
  Filled 2024-03-25 (×16): qty 1

## 2024-03-25 MED ORDER — CEPHALEXIN 500 MG PO CAPS
500.0000 mg | ORAL_CAPSULE | Freq: Four times a day (QID) | ORAL | Status: DC
  Administered 2024-03-25 (×2): 500 mg via ORAL
  Filled 2024-03-25 (×2): qty 1

## 2024-03-25 MED ORDER — HYDRALAZINE HCL 20 MG/ML IJ SOLN
10.0000 mg | Freq: Once | INTRAMUSCULAR | Status: AC
  Administered 2024-03-25: 10 mg via INTRAVENOUS
  Filled 2024-03-25: qty 1

## 2024-03-25 NOTE — Plan of Care (Signed)
°  Problem: Education: Goal: Ability to state activities that reduce stress will improve Outcome: Progressing   Problem: Coping: Goal: Ability to identify and develop effective coping behavior will improve Outcome: Progressing   Problem: Self-Concept: Goal: Ability to identify factors that promote anxiety will improve Outcome: Progressing Goal: Level of anxiety will decrease Outcome: Progressing Goal: Ability to modify response to factors that promote anxiety will improve Outcome: Progressing   Problem: Education: Goal: Utilization of techniques to improve thought processes will improve Outcome: Progressing Goal: Knowledge of the prescribed therapeutic regimen will improve Outcome: Progressing

## 2024-03-25 NOTE — ED Notes (Signed)
 ED RN taking over care of pt at this time. Pt reporting to ED d/t depression, lost husband 2 years ago and per family, has not been taking prescribed medications. Pt is ambulatory but per outgoing RN, sometimes act as if she can not. Pt able to sit up in bed on her own. Pt ABCs intact. RR even and unlabored. Pt in NAD. Bed in lowest locked position. Denies needs at this time.   Past Medical History:  Diagnosis Date   Depression    Hypertension

## 2024-03-25 NOTE — ED Notes (Signed)
 Patient had one episode of urine incontinence. This NT provided pericare, a clean brief, chux pad, and a clean fitted sheet. Patient stated no further needs at this time.

## 2024-03-25 NOTE — ED Provider Notes (Signed)
 "  [facility] Provider Note    Event Date/Time   First MD Initiated Contact with Patient 03/25/24 [phone]     (approximate)   History   Psychiatric Evaluation (Patient arrives to ER via EMS from Home to be evaluated per Family request on recent refusal to take any medications and appears withdrawn)   HPI  Carletha Dawn is a 64 y.o. female with mild episode of recurrent major depressive disorder, GERD, history of delirium, tobacco use, hypertension who presents today with increased confusion.  History is primarily obtained by EMS triage note as EMS as left by the time of my assessment and I have attempted to call the patient's daughter with the phone number on 2 different occasions however no answer and voice box is full.  Per report patient has not taken any of her medications for the past month.  Patient states that she has some shortness of breath and chest pain but denies any SI or HI.  She does not answer every question correctly.  Unclear whether there has been fevers at home       Physical Exam   Triage Vital Signs: ED Triage Vitals [03/25/24 0835]  Encounter Vitals Group     BP (!) 232/127     Girls Systolic BP Percentile      Girls Diastolic BP Percentile      Boys Systolic BP Percentile      Boys Diastolic BP Percentile      Pulse Rate 70     Resp 20     Temp 98.4 F (36.9 C)     Temp src      SpO2 100 %     Weight 141 lb 1.5 oz (64 kg)     Height 5' 7 (1.702 m)     Head Circumference      Peak Flow      Pain Score 0     Pain Loc      Pain Education      Exclude from Growth Chart     Most recent vital signs: Vitals:   03/25/24 1303 03/25/24 1419  BP: (!) 160/97 (!) 141/85  Pulse:  82  Resp: 20 20  Temp:  98.2 F (36.8 C)  SpO2:  100%    Nursing Triage Note reviewed. Vital signs reviewed and patients oxygen saturation is normoxic, but patient extremely hypertensive.   General: Patient is well nourished, well  developed, awake and alert, rHead: Normocephalic and atraumatic Eyes: Normal inspection, extraocular muscles intact, no conjunctival pallor Ear, nose, throat: Normal external exam Neck: Normal range of motion Respiratory: Patient is in mild respiratory distress, lungs with mild wheezes Cardiovascular: Patient is not tachycardic, RR GI: Abd SNT with no guarding or rebound  Back: Normal inspection of the back with good strength and range of motion throughout all ext Extremities: pulses intact with good cap refills, no LE pitting edema or calf tenderness Neuro: The patient is alert and oriented to person, not to place or time, very confused, with 5/5 bilat UE/LE strength, no gross motor or sensory defects noted. Coordination appears to be adequate. Skin: Warm, dry, and intact Psych: depressed mood, odd affect, no SI or HI  ED Results / Procedures / Treatments   Labs (all labs ordered are listed, but only abnormal results are displayed) Labs Reviewed  PRO BRAIN NATRIURETIC PEPTIDE - Abnormal; Notable for the following components:      Result Value   Pro Brain Natriuretic Peptide 375.0 (*)  All other components within normal limits  URINALYSIS, COMPLETE (UACMP) WITH MICROSCOPIC - Abnormal; Notable for the following components:   Color, Urine YELLOW (*)    APPearance CLOUDY (*)    Specific Gravity, Urine 1.004 (*)    Hgb urine dipstick MODERATE (*)    Leukocytes,Ua SMALL (*)    Bacteria, UA MANY (*)    All other components within normal limits  RESP PANEL BY RT-PCR (RSV, FLU A&B, COVID)  [id number]  URINE CULTURE  CBC WITH DIFFERENTIAL/PLATELET  COMPREHENSIVE METABOLIC PANEL WITH GFR  LIPASE, BLOOD  URINE DRUG SCREEN  ETHANOL  TROPONIN T, HIGH SENSITIVITY  TROPONIN T, HIGH SENSITIVITY     EKG EKG and rhythm strip are interpreted by myself:   EKG: [Normal sinus rhythm] at heart rate of 63, normal QRS duration, QTc 443, nonspecific ST segments and T waves no ectopy EKG not  consistent with Acute STEMI Rhythm strip: NSR in lead II   RADIOLOGY CXR: No acute abnormality on my independent review interpretation radiologist agrees CT head: No acute abnormality on my independent review interpretation radiologist agrees    PROCEDURES:  Critical Care performed: No  Procedures   MEDICATIONS ORDERED IN ED: Medications  cephALEXin  (KEFLEX ) capsule 500 mg (has no administration in time range)  amLODipine  (NORVASC ) tablet 5 mg (5 mg Oral Given 03/25/24 0957)  hydrALAZINE  (APRESOLINE ) injection 10 mg (10 mg Intravenous Given 03/25/24 1011)  hydrALAZINE  (APRESOLINE ) injection 10 mg (10 mg Intravenous Given 03/25/24 1216)  amLODipine  (NORVASC ) tablet 5 mg (5 mg Oral Given 03/25/24 1215)  hydrochlorothiazide  (HYDRODIURIL ) tablet 25 mg (25 mg Oral Given 03/25/24 1416)     IMPRESSION / MDM / ASSESSMENT AND PLAN / ED COURSE                                Differential diagnosis includes, but is not limited to, intracranial hemorrhage, UTI, metabolic encephalopathy, hypercarbia, atypical ACS, hypertensive crisis, UTI  ED course: Patient presents and history is extremely limited and patient's daughter was not answering the phone which is the only emergency contact that we have.  She arrives and her blood pressure is extremely elevated however I do not appreciate any focal deficits on presentation.  Acute ischemia.  I did administer a dose of hydralazine  and Norvasc , with temporary improvement in the patient's blood pressure however this did rebound and will give her a second dose of Norvasc  and hydralazine  as well.  CMP demonstrated no acute renal insufficiency or electrolyte derangements.  She has no leukocytosis.  Urinalysis is pending and will get her straight cath.  At this time it is unclear to me whether this is secondary to an infectious disorder, metabolic encephalopathy or organic psychiatric disorder as patient certainly has history of both.  2:18 PM on 03/25/2024: I was  finally able to make contact with the patient's daughter who has a different phone number lasted (see below in my progress note).  Patient has a history of similar occurrences secondary to psychiatric problems.  Urinalysis was not clear for UTI but will treat prophylactically albeit patient does not complain of this and I do not think she needs admission for this we will start her on 5 days of Keflex .  Patient's blood pressure has downtrended with appropriate medications.  She has no evidence of acute renal insufficiency.  At this time I do think patient's primary issue is psychiatric and will plan for making the patient a psychiatric  admission.  The patient's daughter is in agreement with this plan  The patient has been placed in psychiatric observation due to the need to provide a safe environment for the patient while obtaining psychiatric consultation and evaluation, as well as ongoing medical and medication management to treat the patient's condition.  The patient has not been placed under full IVC at this time.     Clinical Course as of 03/25/24 1421  Thu Mar 25, 2024  1003 Attempted to call daughter: Macayla Ekdahl Daughter Emergency Contact [phone]   Voicemailbox not set up, no further information [HD]  1113 Troponin T High Sensitivity: <15 [HD]  1122 Attempted to call daughter again, no answer, voice box not set up. No family at bedside.  [HD]  1156 BP(!): 215/122 [HD]  1158 BP(!): 215/122 [HD]  1257 BP(!): 192/113 Still elevated [HD]  1309 CT Head Wo Contrast Mild chronic microvascular disease but no intracranial hemorrhage [HD]  1321 Able to talk with with daughter: [name] [phone]; This has going on for about a month. Has been diagnosed with depression and has had similar presentations in the past.  She has been having 1 month of increased confusion and has not taken all of her medications.  There has been no trauma there has been no facial weakness or visibility  stating.  [HD]  1420 BP(!): 160/97 Much improved [HD]    Clinical Course User Index [HD] Nicholaus Rolland BRAVO, MD   -- Risk: 5 This patient has a high risk of morbidity due to further diagnostic testing or treatment. Rationale: This patients evaluation and management involve a high risk of morbidity due to the potential severity of presenting symptoms, need for diagnostic testing, and/or initiation of treatment that may require close monitoring. The differential includes conditions with potential for significant deterioration or requiring escalation of care. Treatment decisions in the ED, including medication administration, procedural interventions, or disposition planning, reflect this level of risk. COPA: 5 The patient has the following acute or chronic illness/injury that poses a possible threat to life or bodily function: [X] : The patient has a potentially serious acute condition or an acute exacerbation of a chronic illness requiring urgent evaluation and management in the Emergency Department. The clinical presentation necessitates immediate consideration of life-threatening or function-threatening diagnoses, even if they are ultimately ruled out.   FINAL CLINICAL IMPRESSION(S) / ED DIAGNOSES   Final diagnoses:  Hypertensive urgency  H/O medication noncompliance  Psychosis, unspecified psychosis type (HCC)     Rx / DC Orders   ED Discharge Orders     None        Note:  This document was prepared using Dragon voice recognition software and may include unintentional dictation errors.   Nicholaus Rolland BRAVO, MD 03/25/24 1421  "

## 2024-03-25 NOTE — ED Triage Notes (Signed)
 Patient arrives to ER via EMS from Home to be evaluated per Family request on recent refusal to take any medications and appears withdrawn

## 2024-03-25 NOTE — BH Assessment (Signed)
 Comprehensive Clinical Assessment (CCA) Note  03/25/2024 [name] [id number]  Chief Complaint:  Chief Complaint  Patient presents with   Psychiatric Evaluation    Patient arrives to ER via EMS from Home to be evaluated per Family request on recent refusal to take any medications and appears withdrawn   Visit Diagnosis: Bipolar (Per the daughter)   [name] is a 64 year old female who presents to the ER due to her family have concerns about her mental health. Per the patient, she was brought to the ER because she had a bunch of fluid on my legs and brain. From where I had my brain surgery. She also shared she walks a lot and that is why her legs were swollen. Patient legs were not swollen.   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 She used to drink half a gallon a day and a [employer] (beer) behind it. Husband past two years ago, and she recently start talking about him. More than her usual. She's not sleeping, talking a lot, running around in parking lots. The daughter believes she stop taking her medications. The patient would ask the daughter or other family members for help but when family try and help, she tell them to leave her alone. I don't want your help.   CCA Screening, Triage and Referral (STR)  Patient Reported Information How did you hear about us ? Family/Friend  What Is the Reason for Your Visit/Call Today? Family have concern about her current mental state  How Long Has This Been Causing You Problems? 1 wk - 1 month  What Do You Feel Would Help You the Most Today? Treatment for Depression or other mood problem   Have You Recently Had Any Thoughts About Hurting Yourself? No  Are You Planning to Commit Suicide/Harm Yourself At This time? No   Flowsheet Row ED from 03/25/2024 in [facility] Emergency Department at [facility] ED from  03/15/2024 in [facility] Emergency Department at [facility] ED from 03/06/2024 in [facility] Emergency Department at [facility] [place]  C-SSRS RISK CATEGORY No Risk No Risk No Risk    Have you Recently Had Thoughts About Hurting Someone [place]? No  Are You Planning to Harm Someone at This Time? No  Explanation: No data recorded  Have You Used Any Alcohol or Drugs in the Past 24 Hours? Yes  How Long Ago Did You Use Drugs or Alcohol? No data recorded What Did You Use and How Much? No data recorded  Do You Currently Have a Therapist/Psychiatrist? No  Name of Therapist/Psychiatrist:    Have You Been Recently Discharged From Any Office Practice or Programs? No  Explanation of Discharge From Practice/Program: No data recorded    CCA Screening Triage Referral Assessment Type of Contact: Face-to-Face  Telemedicine Service Delivery:   Is this Initial or Reassessment?   Date Telepsych consult ordered in [facility]:    Time Telepsych consult ordered in [facility]:    Location of Assessment: [facility] ED  Provider Location: [facility] ED   Collateral Involvement: No data recorded  Does Patient Have a Court Appointed Legal Guardian? No data recorded Legal Guardian Contact Information: No data recorded Copy of Legal Guardianship Form: No data recorded Legal Guardian Notified of Arrival: No data recorded Legal Guardian Notified of Pending Discharge: No data recorded If Minor and Not Living with Parent(s), Who has Custody? No data recorded Is CPS involved or  ever been involved? Never  Is APS involved or ever been involved? Never   Patient Determined To Be At Risk for Harm To Self or Others Based on Review of Patient Reported Information or Presenting Complaint? No  Method: No data recorded Availability of Means: No data recorded Intent: No data recorded Notification Required: No data recorded Additional Information for Danger to Others Potential: No data recorded Additional Comments for Danger  to Others Potential: No data recorded Are There Guns or Other Weapons in Your Home? No  Types of Guns/Weapons: No data recorded Are These Weapons Safely Secured?                            No data recorded Who Could Verify You Are Able To Have These Secured: No data recorded Do You Have any Outstanding Charges, Pending Court Dates, Parole/Probation? No data recorded Contacted To Inform of Risk of Harm To Self or Others: No data recorded   Does Patient Present under Involuntary Commitment? No    [place] of Residence: [place]   Patient Currently Receiving the Following Services: Not Receiving Services   Determination of Need: Emergent (2 hours)   Options For Referral: ED Visit     CCA Biopsychosocial Patient Reported Schizophrenia/Schizoaffective Diagnosis in Past: No   Strengths: Able to perform her ADLs, have support system, and stable housing.   Mental Health Symptoms Depression:  Change in energy/activity; Difficulty Concentrating; Sleep (too much or little); Irritability   Duration of Depressive symptoms: Duration of Depressive Symptoms: Greater than two weeks   Mania:  Increased Energy; Irritability; Racing thoughts; Recklessness   Anxiety:   Restlessness; Difficulty concentrating   Psychosis:  None   Duration of Psychotic symptoms:    Trauma:  N/A   Obsessions:  N/A   Compulsions:  No data recorded  Inattention:  N/A   Hyperactivity/Impulsivity:  N/A   Oppositional/Defiant Behaviors:  N/A   Emotional Irregularity:  N/A   Other Mood/Personality Symptoms:  No data recorded   Mental Status Exam Appearance and self-care  Stature:  Average   Weight:  Average weight   Clothing:  Neat/clean; Age-appropriate   Grooming:  Normal   Cosmetic use:  None   Posture/gait:  Normal   Motor activity:  -- (Within normal range)   Sensorium  Attention:  Normal   Concentration:  Preoccupied; Scattered   Orientation:  X5   Recall/memory:  Normal    Affect and Mood  Affect:  Appropriate; Full Range   Mood:  Depressed; Anxious   Relating  Eye contact:  Normal   Facial expression:  Depressed; Responsive; Sad   Attitude toward examiner:  Cooperative   Thought and Language  Speech flow: Clear and Coherent   Thought content:  Appropriate to Mood and Circumstances   Preoccupation:  None   Hallucinations:  None   Organization:  Intact   [employer] of Knowledge:  Fair   Intelligence:  Average   Abstraction:  Concrete   Judgement:  Impaired   Reality Testing:  Distorted   Insight:  Flashes of insight   Decision Making:  Impulsive   Social Functioning  Social Maturity:  Isolates   Social Judgement:  Chief Of Staff; Heedless   Stress  Stressors:  Other (Comment)   Coping Ability:  Overwhelmed   Skill Deficits:  None   Supports:  Family; Friends/Service system     Religion: Religion/Spirituality Are You A Religious Person?: No  Leisure/Recreation:  Leisure / Recreation Do You Have Hobbies?: No  Exercise/Diet: Exercise/Diet Do You Exercise?: No Have You Gained or Lost A Significant Amount of Weight in the Past Six Months?: No Do You Follow a Special Diet?: No Do You Have Any Trouble Sleeping?: No   CCA Employment/Education Employment/Work Situation: Employment / Work Situation Employment Situation: Unemployed Patient's Job has Been Impacted by Current Illness: No Has Patient ever Been in Equities Trader?: No  Education: Education Is Patient Currently Attending School?: No Did You Have An Individualized Education Program (IIEP): No Did You Have Any Difficulty At [employer]?: No Patient's Education Has Been Impacted by Current Illness: No   CCA Family/Childhood History Family and Relationship History: Family history Marital status: Single Does patient have children?: No  Childhood History:  Childhood History By whom was/is the patient raised?: Mother Did patient suffer any  verbal/emotional/physical/sexual abuse as a child?: No Did patient suffer from severe childhood neglect?: No Has patient ever been sexually abused/assaulted/raped as an adolescent or adult?: No Was the patient ever a victim of a crime or a disaster?: No Witnessed domestic violence?: No Has patient been affected by domestic violence as an adult?: No       CCA Substance Use Alcohol/Drug Use: Alcohol / Drug Use Pain Medications: See MAR Prescriptions: See MAR Over the Counter: See MAR History of alcohol / drug use?: Yes Longest period of sobriety (when/how long): Unable to quantify Substance #1 Name of Substance 1: Alcohol     ASAM's:  Six Dimensions of Multidimensional Assessment  Dimension 1:  Acute Intoxication and/or Withdrawal Potential:      Dimension 2:  Biomedical Conditions and Complications:      Dimension 3:  Emotional, Behavioral, or Cognitive Conditions and Complications:     Dimension 4:  Readiness to Change:     Dimension 5:  Relapse, Continued use, or Continued Problem Potential:     Dimension 6:  Recovery/Living Environment:     ASAM Severity Score:    ASAM Recommended Level of Treatment:     Substance use Disorder (SUD)    Recommendations for Services/Supports/Treatments:    Disposition Recommendation per psychiatric provider: Pending Inpatient Treatment   DSM5 Diagnoses: 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 Therapeutic Triage Specialist 03/25/2024 4:25 PM

## 2024-03-25 NOTE — ED Notes (Signed)
 [place], [facility] given brief report on pt.

## 2024-03-25 NOTE — ED Notes (Signed)
 VOL  seen by TTS Kiki pending psych consult

## 2024-03-25 NOTE — ED Notes (Signed)
 Dinner tray provided to pt

## 2024-03-26 DIAGNOSIS — F332 Major depressive disorder, recurrent severe without psychotic features: Secondary | ICD-10-CM | POA: Diagnosis not present

## 2024-03-26 DIAGNOSIS — F4329 Adjustment disorder with other symptoms: Secondary | ICD-10-CM | POA: Diagnosis not present

## 2024-03-26 LAB — URINE CULTURE

## 2024-03-26 MED ORDER — FLUOXETINE HCL 20 MG PO CAPS
40.0000 mg | ORAL_CAPSULE | Freq: Every morning | ORAL | Status: DC
  Administered 2024-03-27 – 2024-04-10 (×15): 40 mg via ORAL
  Filled 2024-03-26 (×15): qty 2

## 2024-03-26 MED ORDER — HYDROCHLOROTHIAZIDE 25 MG PO TABS
25.0000 mg | ORAL_TABLET | Freq: Every day | ORAL | Status: DC
  Administered 2024-03-27 – 2024-04-08 (×13): 25 mg via ORAL
  Filled 2024-03-26 (×13): qty 1

## 2024-03-26 MED ORDER — OLANZAPINE 10 MG IM SOLR
5.0000 mg | Freq: Three times a day (TID) | INTRAMUSCULAR | Status: DC | PRN
  Administered 2024-04-04: 5 mg via INTRAMUSCULAR
  Filled 2024-03-26: qty 10

## 2024-03-26 MED ORDER — RISPERIDONE 1 MG PO TABS
1.0000 mg | ORAL_TABLET | Freq: Two times a day (BID) | ORAL | Status: DC
  Administered 2024-03-27 – 2024-03-29 (×5): 1 mg via ORAL
  Filled 2024-03-26 (×7): qty 1

## 2024-03-26 MED ORDER — ONDANSETRON 4 MG PO TBDP
4.0000 mg | ORAL_TABLET | Freq: Three times a day (TID) | ORAL | Status: DC | PRN
  Filled 2024-03-26: qty 1

## 2024-03-26 MED ORDER — OLANZAPINE 5 MG PO TABS
5.0000 mg | ORAL_TABLET | Freq: Three times a day (TID) | ORAL | Status: DC | PRN
  Administered 2024-03-26 – 2024-04-09 (×12): 5 mg via ORAL
  Filled 2024-03-26 (×13): qty 1

## 2024-03-26 NOTE — BH IP Treatment Plan (Signed)
 Interdisciplinary Treatment and Diagnostic Plan Update  03/26/2024 Time of Session: 11:15 AM  Modelle Vollmer MRN: [id number]  Principal Diagnosis: Adjustment disorder with emotional disturbance  Secondary Diagnoses: Principal Problem:   Adjustment disorder with emotional disturbance   Current Medications:  Current Facility-Administered Medications  Medication Dose Route Frequency Provider Last Rate Last Admin   acetaminophen  (TYLENOL ) tablet 650 mg  650 mg Oral Q6H PRN [name], [name], MD       albuterol  (PROVENTIL ) (2.5 MG/3ML) 0.083% nebulizer solution 3 mL  3 mL Inhalation Q4H PRN [name], [name], MD       alum & mag hydroxide-simeth (MAALOX/MYLANTA) 200-200-20 MG/5ML suspension 30 mL  30 mL Oral Q4H PRN [name], [name], MD       amLODipine  (NORVASC ) tablet 10 mg  10 mg Oral Daily [name], [name], MD   10 mg at 03/26/24 1003   cephALEXin  (KEFLEX ) capsule 500 mg  500 mg Oral Q6H [name], [name], MD   500 mg at 03/26/24 0609   haloperidol  (HALDOL ) tablet 5 mg  5 mg Oral TID PRN [name], [name], MD       And   diphenhydrAMINE  (BENADRYL ) capsule 50 mg  50 mg Oral TID PRN [name], [name], MD       magnesium  hydroxide (MILK OF MAGNESIA) suspension 30 mL  30 mL Oral Daily PRN [name], [name], MD       pantoprazole  (PROTONIX ) EC tablet 40 mg  40 mg Oral Daily [name], [name], MD   40 mg at 03/26/24 1003   PTA Medications: Medications Prior to Admission  Medication Sig Dispense Refill Last Dose/Taking   amLODipine  (NORVASC ) 5 MG tablet Take 1 tablet (5 mg total) by mouth daily. 90 tablet 1    FLUoxetine  (PROZAC ) 40 MG capsule Take 1 capsule (40 mg total) by mouth once daily every morning. 90 capsule 1    hydrochlorothiazide  (HYDRODIURIL ) 25 MG tablet Take 1 tablet (25 mg total) by mouth daily. 90 tablet 1    meloxicam  (MOBIC ) 15 MG tablet Take 1 tablet (15 mg total) by mouth daily as needed for pain. 90 tablet 1    omeprazole  (PRILOSEC) 40 MG capsule Take 1 capsule  (40 mg total) by mouth daily before breakfast. 90 capsule 1    ondansetron  (ZOFRAN -ODT) 4 MG disintegrating tablet Take 1 tablet (4 mg total) by mouth every 8 (eight) hours as needed for nausea or vomiting. 30 tablet 2    Sofosbuvir -Velpatasvir  (EPCLUSA ) 400-100 MG TABS Take 1 tablet by mouth daily. 28 tablet 2     Patient Stressors:    Patient Strengths:    Treatment Modalities: Medication Management, Group therapy, Case management,  1 to 1 session with clinician, Psychoeducation, Recreational therapy.   Physician Treatment Plan for Primary Diagnosis: Adjustment disorder with emotional disturbance Long Term Goal(s):     Short Term Goals:    Medication Management: Evaluate patient's response, side effects, and tolerance of medication regimen.  Therapeutic Interventions: 1 to 1 sessions, Unit Group sessions and Medication administration.  Evaluation of Outcomes: Not Progressing  Physician Treatment Plan for Secondary Diagnosis: Principal Problem:   Adjustment disorder with emotional disturbance  Long Term Goal(s):     Short Term Goals:       Medication Management: Evaluate patient's response, side effects, and tolerance of medication regimen.  Therapeutic Interventions: 1 to 1 sessions, Unit Group sessions and Medication administration.  Evaluation of Outcomes: Not Progressing   RN Treatment Plan for Primary Diagnosis:  Adjustment disorder with emotional disturbance Long Term Goal(s): Knowledge of disease and therapeutic regimen to maintain health will improve  Short Term Goals: Ability to remain free from injury will improve, Ability to verbalize frustration and anger appropriately will improve, Ability to demonstrate self-control, Ability to participate in decision making will improve, Ability to verbalize feelings will improve, Ability to disclose and discuss suicidal ideas, Ability to identify and develop effective coping behaviors will improve, and Compliance with  prescribed medications will improve  Medication Management: RN will administer medications as ordered by provider, will assess and evaluate patient's response and provide education to patient for prescribed medication. RN will report any adverse and/or side effects to prescribing provider.  Therapeutic Interventions: 1 on 1 counseling sessions, Psychoeducation, Medication administration, Evaluate responses to treatment, Monitor vital signs and CBGs as ordered, Perform/monitor CIWA, COWS, AIMS and Fall Risk screenings as ordered, Perform wound care treatments as ordered.  Evaluation of Outcomes: Not Progressing   LCSW Treatment Plan for Primary Diagnosis: Adjustment disorder with emotional disturbance Long Term Goal(s): Safe transition to appropriate next level of care at discharge, Engage patient in therapeutic group addressing interpersonal concerns.  Short Term Goals: Engage patient in aftercare planning with referrals and resources, Increase social support, Increase ability to appropriately verbalize feelings, Increase emotional regulation, Facilitate acceptance of mental health diagnosis and concerns, Facilitate patient progression through stages of change regarding substance use diagnoses and concerns, Identify triggers associated with mental health/substance abuse issues, and Increase skills for wellness and recovery  Therapeutic Interventions: Assess for all discharge needs, 1 to 1 time with Social worker, Explore available resources and support systems, Assess for adequacy in community support network, Educate family and significant other(s) on suicide prevention, Complete Psychosocial Assessment, Interpersonal group therapy.  Evaluation of Outcomes: Not Progressing   Progress in Treatment: Attending groups: No. Participating in groups: No. Taking medication as prescribed: No. Toleration medication: No. Family/Significant other contact made: No, will contact:  CSW will contact   Patient understands diagnosis: No. Discussing patient identified problems/goals with staff: No. Medical problems stabilized or resolved: Yes. Denies suicidal/homicidal ideation: No. and As evidenced by:  pt not speaking  Issues/concerns per patient self-inventory: No. Other: None  New problem(s) identified: No, Describe:  None identified   New Short Term/Long Term Goal(s): detox, elimination of symptoms of psychosis, medication management for mood stabilization; elimination of SI thoughts; development of comprehensive mental wellness/sobriety plan.   Patient Goals:  Pt unable to identify goals   Discharge Plan or Barriers: CSW will assist with appropriate discharge planning   Reason for Continuation of Hospitalization: Medication stabilization  Estimated Length of Stay: 1 to 7 days  Last 3 [place] Suicide Severity Risk Score: Flowsheet Row Admission (Current) from 03/25/2024 [facility] BEHAVIORAL MEDICINE Most recent reading at 03/26/2024 12:00 AM ED from 03/25/2024 in [facility] Emergency Department at [facility] Most recent reading at 03/25/2024  4:15 PM ED from 03/15/2024 in [facility] Emergency Department at [facility] Most recent reading at 03/15/2024  1:27 PM  C-SSRS RISK CATEGORY No Risk No Risk No Risk    Last PHQ 2/9 Scores:    11/24/2023    1:43 PM 07/14/2023    1:16 PM 10/29/2022    9:49 AM  Depression screen PHQ 2/9  Decreased Interest 1 0 1  Down, Depressed, Hopeless 1 0 1  PHQ - 2 Score 2 0 2  Altered sleeping 2 0 1  Tired, decreased energy 1 0 2  Change in appetite 0  0 0  Feeling bad or failure about yourself  0 0 0  Trouble concentrating 0 0 1  Moving slowly or fidgety/restless 0 0 0  Suicidal thoughts 0 0 0  PHQ-9 Score 5  0  6   Difficult doing work/chores Not difficult at all Not difficult at all Not difficult at all     Data saved with a previous flowsheet row definition    Scribe for Treatment Team: Lum JONETTA Croft,  [place] 03/26/2024 11:29 AM

## 2024-03-26 NOTE — H&P (Signed)
 " Psychiatric Admission Assessment Adult  Patient Identification: [name] MRN:  [id number] Date of Evaluation:  03/26/2024 Chief Complaint:  Adjustment disorder with emotional disturbance [F43.29]   [name] Daughter, Emergency Contact [phone] (Mobile)   History of Present Illness: ED Consult note: 2:18 PM on 03/25/2024: I was finally able to make contact with the patient's daughter who has a different phone number lasted (see below in my progress note).  Patient has a history of similar occurrences secondary to psychiatric problems.  Urinalysis was not clear for UTI but will treat prophylactically albeit patient does not complain of this and I do not think she needs admission for this we will start her on 5 days of Keflex .  Patient's blood pressure has downtrended with appropriate medications.  She has no evidence of acute renal insufficiency.  At this time I do think patient's primary issue is psychiatric and will plan for making the patient a psychiatric admission.  The patient's daughter is in agreement with this plan.  Per ED note Able to talk with with daughter: [name] [phone]; This has going on for about a month. Has been diagnosed with depression and has had similar presentations in the past.  She has been having 1 month of increased confusion and has not taken all of her medications.  There has been no trauma there has been no facial weakness or visibility stating.    [name] is a 64 y.o. female with mild episode of recurrent major depressive disorder, GERD, history of delirium, tobacco use, hypertension who presents to the emergency department 03/25/2024 via EMS with increased confusion.  History is primarily obtained by EMS triage note. Per report patient has not taken any of her medications for the past month. Patient is a poor historian and does not answer each question asked.   Patient was interviewed in the presence of provider and arts development officer.  Patient stood in her room, remains selectively mute.  She is unable to answer most of the questions and kept repeating talk to my daughter.  She is unable to give with the whereabouts of her daughter at the phone number or the name.  She is unable to answer the orientation questions including the current location and the reason for her hospital visit.  She did not respond to questions related to mental health including SI/HI/plan and hallucinations.  Later in the day patient displayed sundowning where she became very agitated demanding to leave.  She was cussing out the providers and staff members and making threats to hurt people Provider called the daughter's number on chart but voicemail was not set to leave any HIPAA compliant voicemail Did the patient present with any abnormal findings indicating the need for additional neurological or psychological testing?  Yes:    Total Time spent with patient: 1 hour Sleep  Sleep:Sleep: Poor  Past Psychiatric History: major depressive disorder Psychiatric History:  Information collected from patient chart and ED Consult note  Prev Dx/Sx: Recurrent, major depressive disorder Current Psych Provider: Unknown  Home Meds (current): Unknown Previous Med Trials: Unknown Therapy: Unknown  Prior Psych Hospitalization: Unknown  Prior Self Harm: Unknown Prior Violence: Unknown  Family Psych History: Unknown  Family Hx suicide: Unknown  Social History:  Developmental Hx: Unknown Educational Hx: Unknown Occupational Hx: Unknown Legal Hx: Unknown Living Situation: Unknown Spiritual Hx: Unknown Access to weapons/lethal means: Unknown.   Substance History Alcohol: Unknown, however, per daughter, she's a heavy drinker  Last Drink: Unknown Number of drinks per  day: Unknown  History of alcohol withdrawal seizures: Unknown History of DT's: Unknown Tobacco: Unknown Illicit drugs: Unknown  Prescription drug abuse: Unknown  Rehab hx:  Unknown. Is the patient at risk to self? No.  Has the patient been a risk to self in the past 6 months? No.  Has the patient been a risk to self within the distant past? No.  Is the patient a risk to others? No.  Has the patient been a risk to others in the past 6 months? No.  Has the patient been a risk to others within the distant past? No.   Columbia Scale:  Flowsheet Row Admission (Current) from 03/25/2024 in [facility] Most recent reading at 03/26/2024 12:00 AM ED from 03/25/2024 in [facility] Emergency Department at [facility] Most recent reading at 03/25/2024  4:15 PM ED from 03/15/2024 in [facility] Emergency Department at [facility] Most recent reading at 03/15/2024  1:27 PM  C-SSRS RISK CATEGORY No Risk No Risk No Risk     Past Medical History:  Past Medical History:  Diagnosis Date   Depression    Hypertension     Past Surgical History:  Procedure Laterality Date   BRAIN SURGERY     BREAST BIOPSY Left 2011   CORE W/CLIP   EYE SURGERY Bilateral    glaucoma   TUBAL LIGATION     Family History: History reviewed. No pertinent family history.  Social History:  Social History   Substance and Sexual Activity  Alcohol Use Yes   Comment: 3-4 40 oz beers per week - occasionally     Social History   Substance and Sexual Activity  Drug Use No      Allergies:  Allergies[1] Lab Results:  Results for orders placed or performed during the hospital encounter of 03/25/24 (from the past 48 hours)  CBC with Differential     Status: None   Collection Time: 03/25/24  9:59 AM  Result Value Ref Range   WBC 5.6 4.0 - 10.5 K/uL   RBC 3.99 3.87 - 5.11 MIL/uL   Hemoglobin 12.9 12.0 - 15.0 g/dL   HCT 61.1 63.9 - 53.9 %   MCV 97.2 80.0 - 100.0 fL   MCH 32.3 26.0 - 34.0 pg   MCHC 33.2 30.0 - 36.0 g/dL   RDW 87.2 88.4 - 84.4 %   Platelets 239 150 - 400 K/uL   nRBC 0.0 0.0 - 0.2 %   Neutrophils Relative % 51 %   Neutro Abs 2.9 1.7 - 7.7 K/uL    Lymphocytes Relative 38 %   Lymphs Abs 2.2 0.7 - 4.0 K/uL   Monocytes Relative 9 %   Monocytes Absolute 0.5 0.1 - 1.0 K/uL   Eosinophils Relative 1 %   Eosinophils Absolute 0.1 0.0 - 0.5 K/uL   Basophils Relative 1 %   Basophils Absolute 0.1 0.0 - 0.1 K/uL   Immature Granulocytes 0 %   Abs Immature Granulocytes 0.02 0.00 - 0.07 K/uL    Comment: Performed at [facility], [address] [place], [place] [address]  Comprehensive metabolic panel     Status: None   Collection Time: 03/25/24  9:59 AM  Result Value Ref Range   Sodium 136 135 - 145 mmol/L   Potassium 3.8 3.5 - 5.1 mmol/L   Chloride 101 98 - 111 mmol/L   CO2 24 22 - 32 mmol/L   Glucose, Bld 90 70 - 99 mg/dL    Comment: Glucose reference  range applies only to samples taken after fasting for at least 8 hours.   BUN 10 8 - 23 mg/dL   Creatinine, Ser 9.35 0.44 - 1.00 mg/dL   Calcium 9.3 8.9 - 89.6 mg/dL   Total Protein 7.2 6.5 - 8.1 g/dL   Albumin 3.9 3.5 - 5.0 g/dL   AST 41 15 - 41 U/L   ALT 40 0 - 44 U/L   Alkaline Phosphatase 81 38 - 126 U/L   Total Bilirubin 0.4 0.0 - 1.2 mg/dL   GFR, Estimated >39 >39 mL/min    Comment: (NOTE) Calculated using the CKD-EPI Creatinine Equation (2021)    Anion gap 11 5 - 15    Comment: Performed at [facility], [address] [place], [place] [address]  Lipase, blood     Status: None   Collection Time: 03/25/24  9:59 AM  Result Value Ref Range   Lipase 37 11 - 51 U/L    Comment: Performed at [facility], [address] [place], [place] [address]  Pro Brain natriuretic peptide     Status: Abnormal   Collection Time: 03/25/24  9:59 AM  Result Value Ref Range   Pro Brain Natriuretic Peptide 375.0 (H) <300.0 pg/mL    Comment: (NOTE) Age Group        Cut-Points    Interpretation  < 50 years     450 pg/mL       NT-proBNP > 450 pg/mL indicates                                ADHF is likely              50 to 75 years  900 pg/mL      NT-proBNP >  900 pg/mL indicates          ADHF is likely  > 75 years      1800 pg/mL     NT-proBNP > 1800 pg/mL indicates          ADHF is likely                           All ages    Results between       Indeterminate. Further clinical             300 and the cut-   information is needed to determine            point for age group   if ADHF is present.                                                             Elecsys proBNP II/ Elecsys proBNP II STAT           Cut-Point                       Interpretation  300 pg/mL                    NT-proBNP <300pg/mL indicates  ADHF is not likely  Performed at [facility], [address] [place], [place] [address]   Troponin T, High Sensitivity     Status: None   Collection Time: 03/25/24  9:59 AM  Result Value Ref Range   Troponin T High Sensitivity <15 0 - 19 ng/L    Comment: (NOTE) Biotin concentrations > 1000 ng/mL falsely decrease TnT results.  Serial cardiac troponin measurements are suggested.  Refer to the Links section for chest pain algorithms and additional  guidance. Performed at [facility], [address] [place], [place] [address]   Resp panel by RT-PCR (RSV, Flu A&B, Covid) Anterior Nasal Swab     Status: None   Collection Time: 03/25/24  9:59 AM   Specimen: Anterior Nasal Swab  Result Value Ref Range   SARS Coronavirus 2 by RT PCR NEGATIVE NEGATIVE    Comment: (NOTE) SARS-CoV-2 target nucleic acids are NOT DETECTED.  The SARS-CoV-2 RNA is generally detectable in upper respiratory specimens during the acute phase of infection. The lowest concentration of SARS-CoV-2 viral copies this assay can detect is 138 copies/mL. A negative result does not preclude SARS-Cov-2 infection and should not be used as the sole basis for treatment or other patient management decisions. A negative result may occur with  improper specimen collection/handling, submission of specimen other than  nasopharyngeal swab, presence of viral mutation(s) within the areas targeted by this assay, and inadequate number of viral copies(<138 copies/mL). A negative result must be combined with clinical observations, patient history, and epidemiological information. The expected result is Negative.  Fact Sheet for Patients:  bloggercourse.com  Fact Sheet for Healthcare Providers:  seriousbroker.it  This test is no t yet approved or cleared by the [place]  FDA and  has been authorized for detection and/or diagnosis of SARS-CoV-2 by FDA under an Emergency Use Authorization (EUA). This EUA will remain  in effect (meaning this test can be used) for the duration of the COVID-19 declaration under Section 564(b)(1) of the Act, 21 U.S.C.section 360bbb-3(b)(1), unless the authorization is terminated  or revoked sooner.       Influenza A by PCR NEGATIVE NEGATIVE   Influenza B by PCR NEGATIVE NEGATIVE    Comment: (NOTE) The Xpert Xpress SARS-CoV-2/FLU/RSV plus assay is intended as an aid in the diagnosis of influenza from Nasopharyngeal swab specimens and should not be used as a sole basis for treatment. Nasal washings and aspirates are unacceptable for Xpert Xpress SARS-CoV-2/FLU/RSV testing.  Fact Sheet for Patients: bloggercourse.com  Fact Sheet for Healthcare Providers: seriousbroker.it  This test is not yet approved or cleared by the [place]  FDA and has been authorized for detection and/or diagnosis of SARS-CoV-2 by FDA under an Emergency Use Authorization (EUA). This EUA will remain in effect (meaning this test can be used) for the duration of the COVID-19 declaration under Section 564(b)(1) of the Act, 21 U.S.C. section 360bbb-3(b)(1), unless the authorization is terminated or revoked.     Resp Syncytial Virus by PCR NEGATIVE NEGATIVE    Comment: (NOTE) Fact Sheet for  Patients: bloggercourse.com  Fact Sheet for Healthcare Providers: seriousbroker.it  This test is not yet approved or cleared by the [place]  FDA and has been authorized for detection and/or diagnosis of SARS-CoV-2 by FDA under an Emergency Use Authorization (EUA). This EUA will remain in effect (meaning this test can be used) for the duration of the COVID-19 declaration under Section 564(b)(1) of the Act, 21 U.S.C. section 360bbb-3(b)(1), unless the authorization is terminated  or revoked.  Performed at [facility], [address] [place], [place] [address]   Troponin T, High Sensitivity     Status: None   Collection Time: 03/25/24 11:46 AM  Result Value Ref Range   Troponin T High Sensitivity <15 0 - 19 ng/L    Comment: (NOTE) Biotin concentrations > 1000 ng/mL falsely decrease TnT results.  Serial cardiac troponin measurements are suggested.  Refer to the Links section for chest pain algorithms and additional  guidance. Performed at [facility], [address] [place], [place] [address]   Ethanol     Status: None   Collection Time: 03/25/24 11:46 AM  Result Value Ref Range   Alcohol, Ethyl (B) <15 <15 mg/dL    Comment: (NOTE) For medical purposes only. Performed at [facility], [address] [place], [place] [address]   Urine Drug Screen     Status: None   Collection Time: 03/25/24  1:48 PM  Result Value Ref Range   Opiates NEGATIVE NEGATIVE   Cocaine NEGATIVE NEGATIVE   Benzodiazepines NEGATIVE NEGATIVE   Amphetamines NEGATIVE NEGATIVE   Tetrahydrocannabinol NEGATIVE NEGATIVE   Barbiturates NEGATIVE NEGATIVE   Methadone Scn, Ur NEGATIVE NEGATIVE   Fentanyl  NEGATIVE NEGATIVE    Comment: (NOTE) Drug screen is for Medical Purposes only. Positive results are preliminary only. If confirmation is needed, notify lab within 5 days.  Drug Class                 Cutoff  (ng/mL) Amphetamine and metabolites 1000 Barbiturate and metabolites 200 Benzodiazepine              200 Opiates and metabolites     300 Cocaine and metabolites     300 THC                         50 Fentanyl                     5 Methadone                   300  Trazodone is metabolized in vivo to several metabolites,  including pharmacologically active m-CPP, which is excreted in the  urine.  Immunoassay screens for amphetamines and MDMA have potential  cross-reactivity with these compounds and may provide false positive  result.  Performed at [facility], [address] [place], [place] [address]   Urinalysis, Complete w Microscopic -Urine, Clean Catch     Status: Abnormal   Collection Time: 03/25/24  1:48 PM  Result Value Ref Range   Color, Urine YELLOW (A) YELLOW   APPearance CLOUDY (A) CLEAR   Specific Gravity, Urine 1.004 (L) 1.005 - 1.030   pH 8.0 5.0 - 8.0   Glucose, UA NEGATIVE NEGATIVE mg/dL   Hgb urine dipstick MODERATE (A) NEGATIVE   Bilirubin Urine NEGATIVE NEGATIVE   Ketones, ur NEGATIVE NEGATIVE mg/dL   Protein, ur NEGATIVE NEGATIVE mg/dL   Nitrite NEGATIVE NEGATIVE   Leukocytes,Ua SMALL (A) NEGATIVE   RBC / HPF 21-50 0 - 5 RBC/hpf   WBC, UA 6-10 0 - 5 WBC/hpf   Bacteria, UA MANY (A) NONE SEEN   Squamous Epithelial / HPF 0-5 0 - 5 /HPF   Mucus PRESENT    Amorphous Crystal PRESENT     Comment: Performed at [facility], [address], [place], [place] [address]  Urine Culture     Status: Abnormal  Collection Time: 03/25/24  1:48 PM   Specimen: Urine, Catheterized  Result Value Ref Range   Specimen Description      URINE, CATHETERIZED Performed at [facility], [address] [place], [place] [address]    Special Requests      NONE Performed at [facility], [address] [place], [place] [address]    Culture MULTIPLE SPECIES PRESENT, SUGGEST RECOLLECTION (A)    Report Status 03/26/2024 FINAL      Blood Alcohol level:  Lab Results  Component Value Date   [facility] <15 03/25/2024   ETH <10 03/03/2020    Metabolic Disorder Labs:  Lab Results  Component Value Date   HGBA1C 5.4 11/24/2023   MPG 108 11/24/2023   No results found for: PROLACTIN Lab Results  Component Value Date   CHOL 242 (H) 11/24/2023   TRIG 97 11/24/2023   HDL 69 11/24/2023   CHOLHDL 3.5 11/24/2023   LDLCALC 152 (H) 11/24/2023    Current Medications: Current Facility-Administered Medications  Medication Dose Route Frequency Provider Last Rate Last Admin   acetaminophen  (TYLENOL ) tablet 650 mg  650 mg Oral Q6H PRN [name], [name], MD       albuterol  (PROVENTIL ) (2.5 MG/3ML) 0.083% nebulizer solution 3 mL  3 mL Inhalation Q4H PRN [name], [name], MD       alum & mag hydroxide-simeth (MAALOX/MYLANTA) 200-200-20 MG/5ML suspension 30 mL  30 mL Oral Q4H PRN [name], [name], MD       amLODipine  (NORVASC ) tablet 10 mg  10 mg Oral Daily [name], [name], MD   10 mg at 03/26/24 1003   [START ON 03/27/2024] FLUoxetine  (PROZAC ) capsule 40 mg  40 mg Oral q morning Ram Haugan, MD       [START ON 03/27/2024] hydrochlorothiazide  (HYDRODIURIL ) tablet 25 mg  25 mg Oral Daily Naraly Fritcher, MD       magnesium  hydroxide (MILK OF MAGNESIA) suspension 30 mL  30 mL Oral Daily PRN [name], [name], MD       OLANZapine  (ZYPREXA ) tablet 5 mg  5 mg Oral Q8H PRN Avalon Coppinger, MD   5 mg at 03/26/24 1721   Or   OLANZapine  (ZYPREXA ) injection 5 mg  5 mg Intramuscular Q8H PRN Kellee Sittner, MD       ondansetron  (ZOFRAN -ODT) disintegrating tablet 4 mg  4 mg Oral Q8H PRN Celicia Minahan, MD       pantoprazole  (PROTONIX ) EC tablet 40 mg  40 mg Oral Daily [name], [name], MD   40 mg at 03/26/24 1003   risperiDONE  (RISPERDAL ) tablet 1 mg  1 mg Oral BID Dennies Coate, MD       PTA Medications: Medications Prior to Admission  Medication Sig Dispense Refill Last Dose/Taking   amLODipine  (NORVASC ) 5 MG tablet Take 1  tablet (5 mg total) by mouth daily. 90 tablet 1    FLUoxetine  (PROZAC ) 40 MG capsule Take 1 capsule (40 mg total) by mouth once daily every morning. 90 capsule 1    hydrochlorothiazide  (HYDRODIURIL ) 25 MG tablet Take 1 tablet (25 mg total) by mouth daily. 90 tablet 1    meloxicam  (MOBIC ) 15 MG tablet Take 1 tablet (15 mg total) by mouth daily as needed for pain. 90 tablet 1    omeprazole  (PRILOSEC) 40 MG capsule Take 1 capsule (40 mg total) by mouth daily before breakfast. 90 capsule 1    ondansetron  (ZOFRAN -ODT) 4 MG disintegrating tablet Take 1 tablet (4 mg total) by mouth  every 8 (eight) hours as needed for nausea or vomiting. 30 tablet 2    Sofosbuvir -Velpatasvir  (EPCLUSA ) 400-100 MG TABS Take 1 tablet by mouth daily. 28 tablet 2     Psychiatric Specialty Exam:  Presentation  General Appearance: Appropriate for Environment  Eye Contact:Fair  Speech:Clear and Coherent  Speech Volume:Normal    Mood and Affect  Mood:Labile; Irritable  Affect:Labile   Thought Process  Thought Processes:Disorganized  Descriptions of Associations:Loose  Orientation:Partial  Thought Content:Illogical; Paranoid Ideation; Delusions  Hallucinations:Hallucinations: None  Ideas of Reference:Delusions  Suicidal Thoughts:Suicidal Thoughts: No  Homicidal Thoughts:Homicidal Thoughts: No   Sensorium  Memory:Remote Poor; Immediate Poor  Judgment:Impaired  Insight:Shallow   Executive Functions  Concentration:Poor  Attention Span:Poor  Recall:-- (unable to assess)  Fund of Knowledge:-- (unable to assess)  Language:-- (unable to assess)   Psychomotor Activity  Psychomotor Activity:Psychomotor Activity: Restlessness   Assets  Assets:Social Support    Musculoskeletal: Strength & Muscle Tone: decreased Gait & Station: unsteady  Physical Exam: Physical Exam Vitals and nursing note reviewed.  HENT:     Head: Normocephalic.     Nose: Nose normal.  Cardiovascular:      Rate and Rhythm: Normal rate.     Pulses: Normal pulses.  Pulmonary:     Effort: Pulmonary effort is normal.  Neurological:     Mental Status: She is alert.    Review of Systems  Constitutional: Negative.   HENT: Negative.    Eyes: Negative.   Cardiovascular: Negative.   Skin: Negative.    Blood pressure 118/77, pulse 67, temperature 98.1 F (36.7 C), resp. rate 18, height 5' 7.01 (1.702 m), weight 60.1 kg, SpO2 100%. Body mass index is 20.75 kg/m.  Principal Diagnosis: Adjustment disorder with emotional disturbance Diagnosis:  Principal Problem:   Adjustment disorder with emotional disturbance   Clinical Decision Making:  Treatment Plan Summary:  Safety and Monitoring:             -- Voluntary admission to inpatient psychiatric unit for safety, stabilization and treatment             -- Daily contact with patient to assess and evaluate symptoms and progress in treatment             -- Patient's case to be discussed in multi-disciplinary team meeting             -- Observation Level: q15 minute checks             -- Vital signs:  q12 hours             -- Precautions: suicide, elopement, and assault   2. Psychiatric Diagnoses and Treatment:                risperdal  1mg  BID will be strated   -- The risks/benefits/side-effects/alternatives to this medication were discussed in detail with the patient and time was given for questions. The patient consents to medication trial.                -- Metabolic profile and EKG monitoring obtained while on an atypical antipsychotic (BMI: Lipid Panel: HbgA1c: QTc:)              -- Encouraged patient to participate in unit milieu and in scheduled group therapies                            3. Medical Issues Being Addressed:  4. Discharge Planning:              -- Social work and case management to assist with discharge planning and identification of hospital follow-up needs prior to discharge             -- Estimated LOS: 5-7  days             -- Discharge Concerns: Need to establish a safety plan; Medication compliance and effectiveness             -- Discharge Goals: Return home with outpatient referrals follow ups  Physician Treatment Plan for Primary Diagnosis: Adjustment disorder with emotional disturbance Long Term Goal(s): Improvement in symptoms so as ready for discharge  Short Term Goals: Ability to identify changes in lifestyle to reduce recurrence of condition will improve, Ability to verbalize feelings will improve, Ability to disclose and discuss suicidal ideas, and Ability to demonstrate self-control will improve  Physician Treatment Plan for Secondary Diagnosis: Principal Problem:   Adjustment disorder with emotional disturbance  Long Term Goal(s): Improvement in symptoms so as ready for discharge  Short Term Goals: Unable to get a reliable answer from patient.   I certify that inpatient services furnished can reasonably be expected to improve the patient's condition.    Kline Bulthuis, MD 1/9/202611:04 PM      [1]  Allergies Allergen Reactions   Lisinopril  Swelling   "
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
## 2024-03-26 NOTE — Group Note (Signed)
 Date:  03/26/2024 Time:  10:38 AM  Group Topic/Focus:  Coping With Mental Health Crisis:   The purpose of this group is to help patients identify strategies for coping with mental health crisis.  Group discusses possible causes of crisis and ways to manage them effectively.    Participation Level:  Did Not Attend    Norleen SHAUNNA Bias 03/26/2024, 10:38 AM

## 2024-03-26 NOTE — [facility] Suicide Risk Assessment (Signed)
 [facility] INPATIENT:  Family/Significant Other Suicide Prevention Education  Suicide Prevention Education:  Education Completed; [name], daughter, [phone],  (name of family member/significant other) has been identified by the patient as the family member/significant other with whom the patient will be residing, and identified as the person(s) who will aid the patient in the event of a mental health crisis (suicidal ideations/suicide attempt).  With written consent from the patient, the family member/significant other has been provided the following suicide prevention education, prior to the and/or following the discharge of the patient.  The suicide prevention education provided includes the following: Suicide risk factors Suicide prevention and interventions National Suicide Hotline telephone number [facility] assessment telephone number [facility] Emergency Assistance [address] [facility] and/or Residential Mobile Crisis Unit telephone number  Request made of family/significant other to: Remove weapons (e.g., guns, rifles, knives), all items previously/currently identified as safety concern.   Remove drugs/medications (over-the-counter, prescriptions, illicit drugs), all items previously/currently identified as a safety concern.  The family member/significant other verbalizes understanding of the suicide prevention education information provided.  The family member/significant other agrees to remove the items of safety concern listed above.  [name] 03/26/2024, 1:52 PM

## 2024-03-26 NOTE — Group Note (Signed)
 Physical/Occupational Therapy Group Note  Group Topic: UE Therex   Group Date: 03/26/2024 Start Time: 1305 End Time: 1335 Facilitators: Clive Warren CROME, OT   Group Description: Group instructed in series of upper extremities exercises, aimed to promote strength, flexibility, range of motion and functional endurance.  Patients provided cuing for proper mechanics and proper pace of exercise; exercises adjusted as necessary for individualized patient needs.  Patient also engaged in cognitive components throughout session, working to integrate attention to task, command following, turn-taking and appropriate social interaction throughout session.  Allowed to ask questions as appropriate, and encouraged to identify specific exercises that they could complete independently outside of group sessions.  Therapeutic Goal(s):  Demonstrate appropriate performance of upper extremity exercises to promote strength, flexibility, range of motion and functional endurance Identify 2-3 specific upper extremity exercises to complete as home exercise program outside of group session  Individual Participation: Pt did not attend.  Participation Level: Did not attend   Participation Quality:   Behavior:   Speech/Thought Process:   Affect/Mood:   Insight:   Judgement:   Modes of Intervention:   Patient Response to Interventions:    Plan: Continue to engage patient in PT/OT groups 1 - 2x/week.  Kritika Stukes R., MPH, MS, OTR/L ascom [phone] 03/26/2024, 3:01 PM

## 2024-03-26 NOTE — Progress Notes (Signed)
 Patient accepted medications but was suspicious during interaction. Progressive agitation and confusion from sad and guarded earlier in the shift. Updated provider. New order for zyprexa . Patient required much encouragement to take. At this time patient in her room with door open. Ongoing monitoring continues.   03/26/24 1730  Psych Admission Type (Psych Patients Only)  Admission Status Voluntary  Psychosocial Assessment  Patient Complaints Anger;Anxiety  Eye Contact Glaring  Facial Expression Angry  Affect Angry;Irritable  Speech Argumentative  Interaction Defensive;Hostile  Motor Activity Restless  Appearance/Hygiene In scrubs  Behavior Characteristics Unwilling to participate;Agressive verbally;Agitated;Anxious;Guarded;Irritable  Mood Angry;Labile  Thought Process  Coherency Tangential  Content Blaming others;Paranoia  Delusions None reported or observed  Perception UTA  Hallucination None reported or observed  Judgment Poor  Confusion Severe

## 2024-03-26 NOTE — Plan of Care (Signed)
" °  Problem: Education: Goal: Ability to state activities that reduce stress will improve Outcome: Not Progressing   Problem: Coping: Goal: Ability to identify and develop effective coping behavior will improve Outcome: Not Progressing   Problem: Self-Concept: Goal: Ability to identify factors that promote anxiety will improve Outcome: Not Progressing Goal: Level of anxiety will decrease Outcome: Not Progressing Goal: Ability to modify response to factors that promote anxiety will improve Outcome: Not Progressing   Problem: Education: Goal: Utilization of techniques to improve thought processes will improve Outcome: Not Progressing Goal: Knowledge of the prescribed therapeutic regimen will improve Outcome: Not Progressing   Problem: Activity: Goal: Interest or engagement in leisure activities will improve Outcome: Not Progressing Goal: Imbalance in normal sleep/wake cycle will improve Outcome: Not Progressing   Problem: Coping: Goal: Coping ability will improve Outcome: Not Progressing Goal: Will verbalize feelings Outcome: Not Progressing   "

## 2024-03-26 NOTE — [facility] Counselor (Signed)
 CSW attempted to complete assessment with pt. Pt does not engage with assessment, pt asked CSW to call her daughter.   CSW will contact pt's daughter.   Lum Croft, MSW, [place] 03/26/2024 1:34 PM

## 2024-03-27 DIAGNOSIS — F332 Major depressive disorder, recurrent severe without psychotic features: Secondary | ICD-10-CM | POA: Diagnosis not present

## 2024-03-27 DIAGNOSIS — F333 Major depressive disorder, recurrent, severe with psychotic symptoms: Secondary | ICD-10-CM | POA: Insufficient documentation

## 2024-03-27 NOTE — Group Note (Signed)
 Date:  03/27/2024 Time:  10:54 AM  Group Topic/Focus:  Coping With Mental Health Crisis:   The purpose of this group is to help patients identify strategies for coping with mental health crisis.  Group discusses possible causes of crisis and ways to manage them effectively.    Participation Level:  Active  Participation Quality:  Appropriate  Affect:  Appropriate  Cognitive:  Appropriate  Insight: Appropriate  Engagement in Group:  Engaged  Modes of Intervention:  Discussion   Arland Nutting 03/27/2024, 10:54 AM

## 2024-03-27 NOTE — Group Note (Signed)
 Date:  03/27/2024 Time:  3:01 PM  Group Topic/Focus:  Movement Therapy    Participation Level:  Did Not Attend    Norleen SHAUNNA Bias 03/27/2024, 3:01 PM

## 2024-03-27 NOTE — Progress Notes (Signed)
" °   03/26/24 2039  Psych Admission Type (Psych Patients Only)  Admission Status Voluntary  Psychosocial Assessment  Patient Complaints Irritability;Isolation;Sadness  Eye Contact Glaring;Poor  Facial Expression Sad;Worried  Affect Irritable  Speech Argumentative  Interaction Guarded;Defensive  Motor Activity Restless  Appearance/Hygiene In scrubs;Poor hygiene  Behavior Characteristics Guarded;Irritable  Mood Labile  Thought Process  Coherency Tangential;Disorganized  Content Paranoia  Delusions None reported or observed  Perception UTA  Hallucination None reported or observed  Judgment Poor  Confusion Severe  Danger to Self  Current suicidal ideation? Denies  Danger to Others  Danger to Others None reported or observed    "

## 2024-03-27 NOTE — Progress Notes (Signed)
 [facility] MD Progress Note  03/27/2024 1:21 PM [name]  MRN:  [id number]  daughter: [name] [phone] provider and daughter discussed the treatment plan extensively and updated on medications Subjective:  Chart reviewed, case discussed in multidisciplinary meeting, patient seen during rounds.   Patient is noted to be sitting in her bed and doing her hair.  She informed the provider that she took shower and ate her breakfast.  She is more verbal today and linear.  She was able to identify her daughter as one of the support system.  She reports the medication is helping her and feels better.  She is unable to identify the symptoms that have improved.  She denies SI/HI/plan and denies hallucinations.  Per nursing patient is noticeably well in the mornings but around evenings after lunch she gets more agitated and confused   Past Psychiatric History: see h&P Family History: History reviewed. No pertinent family history. Social History:  Social History   Substance and Sexual Activity  Alcohol Use Yes   Comment: 3-4 40 oz beers per week - occasionally     Social History   Substance and Sexual Activity  Drug Use No    Social History   Socioeconomic History   Marital status: Widowed    Spouse name: Not on file   Number of children: Not on file   Years of education: Not on file   Highest education level: High school graduate  Occupational History   Not on file  Tobacco Use   Smoking status: Every Day    Current packs/day: 0.33    Types: Cigarettes   Smokeless tobacco: Never  Vaping Use   Vaping status: Never Used  Substance and Sexual Activity   Alcohol use: Yes    Comment: 3-4 40 oz beers per week - occasionally   Drug use: No   Sexual activity: Not Currently  Other Topics Concern   Not on file  Social History Narrative   Not on file   Social Drivers of Health   Tobacco Use: High Risk (03/25/2024)   Patient History    Smoking Tobacco Use: Every Day     Smokeless Tobacco Use: Never    Passive Exposure: Not on file  Financial Resource Strain: Not on file  Food Insecurity: No Food Insecurity (03/26/2024)   Epic    Worried About Programme Researcher, Broadcasting/film/video in the Last Year: Never true    Ran Out of Food in the Last Year: Never true  Transportation Needs: No Transportation Needs (03/26/2024)   Epic    Lack of Transportation (Medical): No    Lack of Transportation (Non-Medical): No  Physical Activity: Not on file  Stress: Not on file  Social Connections: Not on file  Depression (PHQ2-9): Medium Risk (11/24/2023)   Depression (PHQ2-9)    PHQ-2 Score: 5  Alcohol Screen: Low Risk (03/25/2024)   Alcohol Screen    Last Alcohol Screening Score (AUDIT): 0  Housing: Low Risk (03/26/2024)   Epic    Unable to Pay for Housing in the Last Year: No    Number of Times Moved in the Last Year: 0    Homeless in the Last Year: No  Utilities: Not At Risk (03/26/2024)   Epic    Threatened with loss of utilities: No  Health Literacy: Not on file   Past Medical History:  Past Medical History:  Diagnosis Date   Depression    Hypertension     Past Surgical History:  Procedure Laterality Date  BRAIN SURGERY     BREAST BIOPSY Left 2011   CORE W/CLIP   EYE SURGERY Bilateral    glaucoma   TUBAL LIGATION      Current Medications: Current Facility-Administered Medications  Medication Dose Route Frequency Provider Last Rate Last Admin   acetaminophen  (TYLENOL ) tablet 650 mg  650 mg Oral Q6H PRN [name], [name], MD       albuterol  (PROVENTIL ) (2.5 MG/3ML) 0.083% nebulizer solution 3 mL  3 mL Inhalation Q4H PRN [name], [name], MD       alum & mag hydroxide-simeth (MAALOX/MYLANTA) 200-200-20 MG/5ML suspension 30 mL  30 mL Oral Q4H PRN [name], [name], MD       amLODipine  (NORVASC ) tablet 10 mg  10 mg Oral Daily [name], [name], MD   10 mg at 03/27/24 9080   FLUoxetine  (PROZAC ) capsule 40 mg  40 mg Oral q morning Zakhia Seres, MD   40 mg at 03/27/24 0920    hydrochlorothiazide  (HYDRODIURIL ) tablet 25 mg  25 mg Oral Daily Megon Kalina, MD   25 mg at 03/27/24 9080   magnesium  hydroxide (MILK OF MAGNESIA) suspension 30 mL  30 mL Oral Daily PRN [name], [name], MD       OLANZapine  (ZYPREXA ) tablet 5 mg  5 mg Oral Q8H PRN Kellen Hover, MD   5 mg at 03/27/24 0216   Or   OLANZapine  (ZYPREXA ) injection 5 mg  5 mg Intramuscular Q8H PRN Baylee Campus, MD       ondansetron  (ZOFRAN -ODT) disintegrating tablet 4 mg  4 mg Oral Q8H PRN Hoorain Kozakiewicz, MD       pantoprazole  (PROTONIX ) EC tablet 40 mg  40 mg Oral Daily [name], [name], MD   40 mg at 03/27/24 0920   risperiDONE  (RISPERDAL ) tablet 1 mg  1 mg Oral BID Elena Davia, MD   1 mg at 03/27/24 9079    Lab Results:  Results for orders placed or performed during the hospital encounter of 03/25/24 (from the past 48 hours)  Urine Drug Screen     Status: None   Collection Time: 03/25/24  1:48 PM  Result Value Ref Range   Opiates NEGATIVE NEGATIVE   Cocaine NEGATIVE NEGATIVE   Benzodiazepines NEGATIVE NEGATIVE   Amphetamines NEGATIVE NEGATIVE   Tetrahydrocannabinol NEGATIVE NEGATIVE   Barbiturates NEGATIVE NEGATIVE   Methadone Scn, Ur NEGATIVE NEGATIVE   Fentanyl  NEGATIVE NEGATIVE    Comment: (NOTE) Drug screen is for Medical Purposes only. Positive results are preliminary only. If confirmation is needed, notify lab within 5 days.  Drug Class                 Cutoff (ng/mL) Amphetamine and metabolites 1000 Barbiturate and metabolites 200 Benzodiazepine              200 Opiates and metabolites     300 Cocaine and metabolites     300 THC                         50 Fentanyl                     5 Methadone                   300  Trazodone is metabolized in vivo to several metabolites,  including pharmacologically active m-CPP, which is excreted in the  urine.  Immunoassay screens for amphetamines and MDMA have potential  cross-reactivity with these  compounds and may provide false  positive  result.  Performed at [facility], [address] [place], [place] [address]   Urinalysis, Complete w Microscopic -Urine, Clean Catch     Status: Abnormal   Collection Time: 03/25/24  1:48 PM  Result Value Ref Range   Color, Urine YELLOW (A) YELLOW   APPearance CLOUDY (A) CLEAR   Specific Gravity, Urine 1.004 (L) 1.005 - 1.030   pH 8.0 5.0 - 8.0   Glucose, UA NEGATIVE NEGATIVE mg/dL   Hgb urine dipstick MODERATE (A) NEGATIVE   Bilirubin Urine NEGATIVE NEGATIVE   Ketones, ur NEGATIVE NEGATIVE mg/dL   Protein, ur NEGATIVE NEGATIVE mg/dL   Nitrite NEGATIVE NEGATIVE   Leukocytes,Ua SMALL (A) NEGATIVE   RBC / HPF 21-50 0 - 5 RBC/hpf   WBC, UA 6-10 0 - 5 WBC/hpf   Bacteria, UA MANY (A) NONE SEEN   Squamous Epithelial / HPF 0-5 0 - 5 /HPF   Mucus PRESENT    Amorphous Crystal PRESENT     Comment: Performed at [facility], [address] [place], [place] [address]  Urine Culture     Status: Abnormal   Collection Time: 03/25/24  1:48 PM   Specimen: Urine, Catheterized  Result Value Ref Range   Specimen Description      URINE, CATHETERIZED Performed at [facility], [address] [place], [place] [address]    Special Requests      NONE Performed at [facility], [address] [place], [place] [address]    Culture MULTIPLE SPECIES PRESENT, SUGGEST RECOLLECTION (A)    Report Status 03/26/2024 FINAL     Blood Alcohol level:  Lab Results  Component Value Date   [facility] <15 03/25/2024   ETH <10 03/03/2020    Metabolic Disorder Labs: Lab Results  Component Value Date   HGBA1C 5.4 11/24/2023   MPG 108 11/24/2023   No results found for: PROLACTIN Lab Results  Component Value Date   CHOL 242 (H) 11/24/2023   TRIG 97 11/24/2023   HDL 69 11/24/2023   CHOLHDL 3.5 11/24/2023   LDLCALC 152 (H) 11/24/2023    Physical Findings: AIMS:  , ,  ,  ,    CIWA:    COWS:      Psychiatric Specialty Exam:  Presentation   General Appearance:  Appropriate for Environment  Eye Contact: Fair  Speech: Clear and Coherent  Speech Volume: Normal    Mood and Affect  Mood: Labile; Irritable  Affect: Labile   Thought Process  Thought Processes: Disorganized  Orientation:Partial  Thought Content:Illogical; Paranoid Ideation; Delusions  Hallucinations:Hallucinations: None  Ideas of Reference:Delusions  Suicidal Thoughts:Suicidal Thoughts: No  Homicidal Thoughts:Homicidal Thoughts: No   Sensorium  Memory: Remote Poor; Immediate Poor  Judgment: Impaired  Insight: Shallow   Executive Functions  Concentration: Poor  Attention Span: Poor  Recall: -- (unable to assess)  Fund of Knowledge: -- (unable to assess)  Language: -- (unable to assess)   Psychomotor Activity  Psychomotor Activity: Psychomotor Activity: Restlessness  Musculoskeletal: Strength & Muscle Tone: within normal limits Gait & Station: normal Assets  Assets: Social Support    Physical Exam: Physical Exam ROS Blood pressure (!) 143/90, pulse 68, temperature 99 F (37.2 C), resp. rate 18, height 5' 7.01 (1.702 m), weight 60.1 kg, SpO2 100%. Body mass index is 20.75 kg/m.  Diagnosis: Active Problems:   MDD (major depressive disorder), recurrent, severe, with psychosis (HCC) R/O    4. Discharge Planning:   --  Social work and case management to assist with discharge planning and identification of hospital follow-up needs prior to discharge  -- Estimated LOS: 3-4 days  Allyn Foil, MD 03/27/2024, 1:21 PM

## 2024-03-27 NOTE — Progress Notes (Signed)
 Patient accepted medications this morning. Denies SI/HI/AVH. Irritable around eating in the dayroom. Spent much of her time in her room. Increasing irritable as the day wanes. At the time of this note patient observed repeatedly coming to her doorway to glaring out and going back into her room. Ongoing monitoring continues.   03/27/24 1100  Psych Admission Type (Psych Patients Only)  Admission Status Voluntary  Psychosocial Assessment  Patient Complaints Irritability;Isolation  Eye Contact Glaring;Poor  Facial Expression Sad;Worried  Affect Irritable  Speech Argumentative  Interaction Guarded  Motor Activity Restless  Appearance/Hygiene In scrubs  Behavior Characteristics Irritable;Cooperative  Mood Labile  Thought Process  Coherency Tangential;Disorganized  Content Paranoia  Delusions None reported or observed  Perception UTA  Hallucination None reported or observed  Judgment Poor  Confusion Severe  Danger to Self  Current suicidal ideation? Denies  Danger to Others  Danger to Others None reported or observed

## 2024-03-27 NOTE — Plan of Care (Signed)
   Problem: Coping: Goal: Ability to identify and develop effective coping behavior will improve Outcome: Progressing   Problem: Self-Concept: Goal: Level of anxiety will decrease Outcome: Progressing

## 2024-03-27 NOTE — Plan of Care (Signed)
" °  Problem: Coping: Goal: Ability to identify and develop effective coping behavior will improve Outcome: Not Progressing   Problem: Self-Concept: Goal: Ability to identify factors that promote anxiety will improve Outcome: Not Progressing Goal: Level of anxiety will decrease Outcome: Not Progressing Goal: Ability to modify response to factors that promote anxiety will improve Outcome: Not Progressing   Problem: Coping: Goal: Coping ability will improve Outcome: Not Progressing Goal: Will verbalize feelings Outcome: Not Progressing   Problem: Education: Goal: Emotional status will improve Outcome: Not Progressing Goal: Mental status will improve Outcome: Not Progressing   "

## 2024-03-27 NOTE — Group Note (Signed)
 Date:  03/27/2024 Time:  3:55 PM  Group Topic/Focus:  Managing Feelings:   The focus of this group is to identify what feelings patients have difficulty handling and develop a plan to handle them in a healthier way upon discharge.    Participation Level:  Did not Attend   [name] 03/27/2024, 3:55 PM

## 2024-03-28 DIAGNOSIS — F332 Major depressive disorder, recurrent severe without psychotic features: Secondary | ICD-10-CM | POA: Diagnosis not present

## 2024-03-28 MED ORDER — THIAMINE MONONITRATE 100 MG PO TABS
100.0000 mg | ORAL_TABLET | Freq: Every day | ORAL | Status: DC
  Administered 2024-03-28 – 2024-04-10 (×14): 100 mg via ORAL
  Filled 2024-03-28 (×14): qty 1

## 2024-03-28 MED ORDER — FOLIC ACID 1 MG PO TABS
1.0000 mg | ORAL_TABLET | Freq: Every day | ORAL | Status: DC
  Administered 2024-03-28 – 2024-04-10 (×14): 1 mg via ORAL
  Filled 2024-03-28 (×14): qty 1

## 2024-03-28 NOTE — Progress Notes (Signed)
 [facility] MD Progress Note  03/28/2024 1:16 PM [name]  MRN:  [id number]  daughter: [name] [phone] provider and daughter discussed the treatment plan extensively and updated on medications Subjective:  Chart reviewed, case discussed in multidisciplinary meeting, patient seen during rounds.   Patient is noted to be sitting in her room.  She is eating some snacks.  Per nursing patient is requesting to eat her meals in her room otherwise she is refusing to have any meals in the day area.  Even daughter explained to the provider that patient does not eat in front of other people.  So special request and permission granted for patient to have her meals in the room in the context of psychosis/social anxiety.  Patient talks about drinking alcohol and doing marijuana/cigarettes recently.  She does acknowledge drinking during the Christmas time and reports drinking anything and everything and how much of her her body can hold.  When asked for more detail she got upset and said that too many questions for today.  She denies SI/HI/plan and denies hallucinations.  Per nursing patient is tolerating medications fairly well  Past Psychiatric History: see h&P Family History: History reviewed. No pertinent family history. Social History:  Social History   Substance and Sexual Activity  Alcohol Use Yes   Comment: 3-4 40 oz beers per week - occasionally     Social History   Substance and Sexual Activity  Drug Use No    Social History   Socioeconomic History   Marital status: Widowed    Spouse name: Not on file   Number of children: Not on file   Years of education: Not on file   Highest education level: High school graduate  Occupational History   Not on file  Tobacco Use   Smoking status: Every Day    Current packs/day: 0.33    Types: Cigarettes   Smokeless tobacco: Never  Vaping Use   Vaping status: Never Used  Substance and Sexual Activity   Alcohol use: Yes    Comment: 3-4  40 oz beers per week - occasionally   Drug use: No   Sexual activity: Not Currently  Other Topics Concern   Not on file  Social History Narrative   Not on file   Social Drivers of Health   Tobacco Use: High Risk (03/25/2024)   Patient History    Smoking Tobacco Use: Every Day    Smokeless Tobacco Use: Never    Passive Exposure: Not on file  Financial Resource Strain: Not on file  Food Insecurity: No Food Insecurity (03/26/2024)   Epic    Worried About Programme Researcher, Broadcasting/film/video in the Last Year: Never true    Ran Out of Food in the Last Year: Never true  Transportation Needs: No Transportation Needs (03/26/2024)   Epic    Lack of Transportation (Medical): No    Lack of Transportation (Non-Medical): No  Physical Activity: Not on file  Stress: Not on file  Social Connections: Not on file  Depression (PHQ2-9): Medium Risk (11/24/2023)   Depression (PHQ2-9)    PHQ-2 Score: 5  Alcohol Screen: Low Risk (03/25/2024)   Alcohol Screen    Last Alcohol Screening Score (AUDIT): 0  Housing: Low Risk (03/26/2024)   Epic    Unable to Pay for Housing in the Last Year: No    Number of Times Moved in the Last Year: 0    Homeless in the Last Year: No  Utilities: Not At Risk (03/26/2024)  Epic    Threatened with loss of utilities: No  Health Literacy: Not on file   Past Medical History:  Past Medical History:  Diagnosis Date   Depression    Hypertension     Past Surgical History:  Procedure Laterality Date   BRAIN SURGERY     BREAST BIOPSY Left 2011   CORE W/CLIP   EYE SURGERY Bilateral    glaucoma   TUBAL LIGATION      Current Medications: Current Facility-Administered Medications  Medication Dose Route Frequency Provider Last Rate Last Admin   acetaminophen  (TYLENOL ) tablet 650 mg  650 mg Oral Q6H PRN [name], [name], MD       albuterol  (PROVENTIL ) (2.5 MG/3ML) 0.083% nebulizer solution 3 mL  3 mL Inhalation Q4H PRN [name], [name], MD       alum & mag hydroxide-simeth (MAALOX/MYLANTA)  200-200-20 MG/5ML suspension 30 mL  30 mL Oral Q4H PRN [name], [name], MD       amLODipine  (NORVASC ) tablet 10 mg  10 mg Oral Daily [name], [name], MD   10 mg at 03/28/24 0940   FLUoxetine  (PROZAC ) capsule 40 mg  40 mg Oral q morning Dusty Raczkowski, MD   40 mg at 03/28/24 0940   hydrochlorothiazide  (HYDRODIURIL ) tablet 25 mg  25 mg Oral Daily Zsazsa Bahena, MD   25 mg at 03/28/24 0940   magnesium  hydroxide (MILK OF MAGNESIA) suspension 30 mL  30 mL Oral Daily PRN [name], [name], MD       OLANZapine  (ZYPREXA ) tablet 5 mg  5 mg Oral Q8H PRN Shealeigh Dunstan, MD   5 mg at 03/27/24 2136   Or   OLANZapine  (ZYPREXA ) injection 5 mg  5 mg Intramuscular Q8H PRN Hanne Kegg, MD       ondansetron  (ZOFRAN -ODT) disintegrating tablet 4 mg  4 mg Oral Q8H PRN Chanan Detwiler, MD       pantoprazole  (PROTONIX ) EC tablet 40 mg  40 mg Oral Daily [name], [name], MD   40 mg at 03/28/24 0940   risperiDONE  (RISPERDAL ) tablet 1 mg  1 mg Oral BID Maanya Hippert, MD   1 mg at 03/28/24 0941    Lab Results:  No results found for this or any previous visit (from the past 48 hours).   Blood Alcohol level:  Lab Results  Component Value Date   [facility] <15 03/25/2024   ETH <10 03/03/2020    Metabolic Disorder Labs: Lab Results  Component Value Date   HGBA1C 5.4 11/24/2023   MPG 108 11/24/2023   No results found for: PROLACTIN Lab Results  Component Value Date   CHOL 242 (H) 11/24/2023   TRIG 97 11/24/2023   HDL 69 11/24/2023   CHOLHDL 3.5 11/24/2023   LDLCALC 152 (H) 11/24/2023    Physical Findings: AIMS:  , ,  ,  ,    CIWA:    COWS:      Psychiatric Specialty Exam:  Presentation  General Appearance:  Appropriate for Environment  Eye Contact: Fair  Speech: Clear and Coherent  Speech Volume: Normal    Mood and Affect  Mood: Labile; Irritable  Affect: Labile   Thought Process  Thought Processes: Disorganized  Orientation:Partial  Thought Content:Illogical;  Paranoid Ideation; Delusions  Hallucinations:No data recorded  Ideas of Reference:Delusions  Suicidal Thoughts:No data recorded  Homicidal Thoughts:No data recorded   Sensorium  Memory: Remote Poor; Immediate Poor  Judgment: Impaired  Insight: Shallow   Executive Functions  Concentration: Poor  Attention  Span: Poor  Recall: -- (unable to assess)  Fund of Knowledge: -- (unable to assess)  Language: -- (unable to assess)   Psychomotor Activity  Psychomotor Activity: No data recorded  Musculoskeletal: Strength & Muscle Tone: within normal limits Gait & Station: normal Assets  Assets: Social Support    Physical Exam: Physical Exam ROS Blood pressure (!) 151/104, pulse 78, temperature 98.4 F (36.9 C), resp. rate 14, height 5' 7.01 (1.702 m), weight 60.1 kg, SpO2 100%. Body mass index is 20.75 kg/m.  Diagnosis: Active Problems:   MDD (major depressive disorder), recurrent, severe, with psychosis (HCC) R/O    4. Discharge Planning:   -- Social work and case management to assist with discharge planning and identification of hospital follow-up needs prior to discharge  -- Estimated LOS: 3-4 days  Allyn Foil, MD 03/28/2024, 1:16 PM

## 2024-03-28 NOTE — Group Note (Signed)
 Date:  03/28/2024 Time:  3:17 PM  Group Topic/Focus:  House rules and unit expectations    Participation Level:  Did Not Attend    [name] Bias 03/28/2024, 3:17 PM

## 2024-03-28 NOTE — Plan of Care (Signed)
  Problem: Activity: Goal: Imbalance in normal sleep/wake cycle will improve Outcome: Progressing   Problem: Coping: Goal: Coping ability will improve Outcome: Progressing   

## 2024-03-28 NOTE — Plan of Care (Signed)
  Problem: Nutrition: Goal: Adequate nutrition will be maintained Outcome: Progressing   Problem: Education: Goal: Knowledge of General Education information will improve Description: Including pain rating scale, medication(s)/side effects and non-pharmacologic comfort measures Outcome: Not Progressing   

## 2024-03-28 NOTE — Progress Notes (Signed)
 Care order that patient is allowed to consume meals in her room. Patient becomes irritable in the evening hours. Therapeutic communication performed.    03/28/24 1000  Psych Admission Type (Psych Patients Only)  Admission Status Voluntary  Psychosocial Assessment  Patient Complaints Isolation  Eye Contact Suspiciousness;Glaring;Poor  Facial Expression Sullen  Affect Apprehensive  Speech Slow  Interaction Isolative  Motor Activity Restless  Appearance/Hygiene Poor hygiene  Behavior Characteristics Fidgety;Irritable  Mood Labile  Thought Process  Coherency Disorganized  Content Paranoia  Delusions UTA  Perception Hallucinations  Hallucination Auditory (unable to explain the voices)  Judgment Poor  Confusion Moderate  Danger to Self  Current suicidal ideation? Denies  Danger to Others  Danger to Others None reported or observed

## 2024-03-28 NOTE — Group Note (Signed)
 Date:  03/28/2024 Time:  9:25 PM  Group Topic/Focus:  Wrap-Up Group:   The focus of this group is to help patients review their daily goal of treatment and discuss progress on daily workbooks.    Participation Level:  Did Not Attend  Participation Quality:     Affect:     Cognitive:     Insight: None  Engagement in Group:  None  Modes of Intervention:     Additional Comments:    Tommas CHRISTELLA Bunker 03/28/2024, 9:25 PM

## 2024-03-28 NOTE — Group Note (Signed)
 Date:  03/28/2024 Time:  10:57 AM  Group Topic/Focus:  Self Care:   The focus of this group is to help patients understand the importance of self-care in order to improve or restore emotional, physical, spiritual, interpersonal, and financial health.    Participation Level:  None  Participation Quality:     Affect:     Cognitive:     Insight: None  Engagement in Group:  None  Modes of Intervention:  Support  Additional Comments:    Jader Desai C Erastus Bartolomei 03/28/2024, 10:57 AM

## 2024-03-28 NOTE — Group Note (Signed)
 [facility] LCSW Group Therapy Note   Group Date: 03/28/2024 Start Time: 1100 End Time: 1130  Type of Therapy and Topic:  Group Therapy:  Feelings around Relapse and Recovery  Participation Level:  Did Not Attend   Mood: Not able to assess.  Description of Group:    Patients in this group will discuss emotions they experience before and after a relapse. They will process how experiencing these feelings, or avoidance of experiencing them, relates to having a relapse. Facilitator will guide patients to explore emotions they have related to recovery. Patients will be encouraged to process which emotions are more powerful. They will be guided to discuss the emotional reaction significant others in their lives may have to patients relapse or recovery. Patients will be assisted in exploring ways to respond to the emotions of others without this contributing to a relapse.  Therapeutic Goals: Patient will identify two or more emotions that lead to relapse for them:  Patient will identify two emotions that result when they relapse:  Patient will identify two emotions related to recovery:  Patient will demonstrate ability to communicate their needs through discussion and/or role plays.   Summary of Patient Progress: Pt did not participate.   Therapeutic Modalities:   Cognitive Behavioral Therapy Solution-Focused Therapy Assertiveness Training Relapse Prevention Therapy   Rexene LELON Mae, LCSWA

## 2024-03-28 NOTE — Progress Notes (Addendum)
" °   03/27/24 2309  Psych Admission Type (Psych Patients Only)  Admission Status Voluntary  Psychosocial Assessment  Patient Complaints Irritability;Isolation  Eye Contact Glaring;Suspiciousness  Facial Expression Anxious;Animated  Affect Irritable;Labile  Speech Argumentative;Pressured  Interaction Isolative;Guarded;Hostile  Motor Activity Restless  Appearance/Hygiene In scrubs  Behavior Characteristics Irritable  Mood Labile  Thought Process  Coherency Tangential  Content Paranoia  Delusions None reported or observed  Perception UTA  Hallucination Auditory  Judgment Poor  Confusion Moderate  Danger to Self  Current suicidal ideation? Denies  Danger to Others  Danger to Others None reported or observed    Patient has been up and visible on the unit. At the start of the shift the patient was cooperative, complied with scheduled HS medications and ate snack. Displayed periods of restlessness. Later in the shift patient began pacing in the hallways, elevating her tone of voice, demanding to be discharged, carrying a linen basket full of her belongings and snacks demanding to leave off the unit immediately. Patient even made multiple calls to her daughters requesting them to come pick her up from the hospital now. Continue to walk towards the exit doors. Offered PRN zyprexa  5mg  P.O., in which the patient accepted after much encouragement. Patient eventually regained control and rested in bed on/off. No further behaviors noted/ reported this shift. Will continue to monitor.    Estimated Sleeping Duration (Last 24 Hours): 6.75-7.50 hours  "

## 2024-03-29 ENCOUNTER — Encounter: Payer: Self-pay | Admitting: Family Medicine

## 2024-03-29 DIAGNOSIS — F332 Major depressive disorder, recurrent severe without psychotic features: Secondary | ICD-10-CM | POA: Diagnosis not present

## 2024-03-29 MED ORDER — ACETAMINOPHEN 325 MG PO TABS
650.0000 mg | ORAL_TABLET | ORAL | Status: DC | PRN
  Administered 2024-03-30 – 2024-04-09 (×14): 650 mg via ORAL
  Filled 2024-03-29 (×14): qty 2

## 2024-03-29 MED ORDER — NICOTINE 14 MG/24HR TD PT24
14.0000 mg | MEDICATED_PATCH | Freq: Every day | TRANSDERMAL | Status: DC
  Administered 2024-03-29 – 2024-04-05 (×5): 14 mg via TRANSDERMAL
  Filled 2024-03-29 (×8): qty 1

## 2024-03-29 MED ORDER — RISPERIDONE 1 MG PO TABS
2.0000 mg | ORAL_TABLET | Freq: Two times a day (BID) | ORAL | Status: DC
  Administered 2024-03-30 – 2024-04-01 (×6): 2 mg via ORAL
  Filled 2024-03-29 (×6): qty 2

## 2024-03-29 MED ORDER — NICOTINE 21 MG/24HR TD PT24: 21.0000 mg | MEDICATED_PATCH | Freq: Every day | TRANSDERMAL | Status: DC

## 2024-03-29 NOTE — Plan of Care (Signed)
   Problem: Education: Goal: Ability to state activities that reduce stress will improve Outcome: Progressing   Problem: Coping: Goal: Ability to identify and develop effective coping behavior will improve Outcome: Progressing   Problem: Self-Concept: Goal: Ability to identify factors that promote anxiety will improve Outcome: Progressing Goal: Level of anxiety will decrease Outcome: Progressing

## 2024-03-29 NOTE — Progress Notes (Signed)
 [facility] MD Progress Note  03/29/2024 11:00 PM [name]  MRN:  [id number]  daughter: [name] [phone] provider and daughter discussed the treatment plan extensively and updated on medications Subjective:  Chart reviewed, case discussed in multidisciplinary meeting, patient seen during rounds.   Patient is noted to be sitting in her room and eating some snacks.  She continues to request to eat in her room.  Later in the day per nursing patient became paranoid and started blaming another female peer for knocking on her door when he actually was not even walking in the hallway patient denies SI/HI/plan.  Patient reports ongoing hallucinations but is unable to give details.  Patient is also noted to be responding to internal stimuli throughout the day.  Per nursing given patient's paranoia and delusions, agitated behavior received as needed Zyprexa .  Past Psychiatric History: see h&P Family History: History reviewed. No pertinent family history. Social History:  Social History   Substance and Sexual Activity  Alcohol Use Yes   Comment: 3-4 40 oz beers per week - occasionally     Social History   Substance and Sexual Activity  Drug Use No    Social History   Socioeconomic History   Marital status: Widowed    Spouse name: Not on file   Number of children: Not on file   Years of education: Not on file   Highest education level: High school graduate  Occupational History   Not on file  Tobacco Use   Smoking status: Every Day    Current packs/day: 0.33    Types: Cigarettes   Smokeless tobacco: Never  Vaping Use   Vaping status: Never Used  Substance and Sexual Activity   Alcohol use: Yes    Comment: 3-4 40 oz beers per week - occasionally   Drug use: No   Sexual activity: Not Currently  Other Topics Concern   Not on file  Social History Narrative   Not on file   Social Drivers of Health   Tobacco Use: High Risk (03/25/2024)   Patient History    Smoking Tobacco  Use: Every Day    Smokeless Tobacco Use: Never    Passive Exposure: Not on file  Financial Resource Strain: Not on file  Food Insecurity: No Food Insecurity (03/26/2024)   Epic    Worried About Programme Researcher, Broadcasting/film/video in the Last Year: Never true    Ran Out of Food in the Last Year: Never true  Transportation Needs: No Transportation Needs (03/26/2024)   Epic    Lack of Transportation (Medical): No    Lack of Transportation (Non-Medical): No  Physical Activity: Not on file  Stress: Not on file  Social Connections: Not on file  Depression (PHQ2-9): Medium Risk (11/24/2023)   Depression (PHQ2-9)    PHQ-2 Score: 5  Alcohol Screen: Low Risk (03/29/2024)   Alcohol Screen    Last Alcohol Screening Score (AUDIT): 0  Housing: Low Risk (03/26/2024)   Epic    Unable to Pay for Housing in the Last Year: No    Number of Times Moved in the Last Year: 0    Homeless in the Last Year: No  Utilities: Not At Risk (03/26/2024)   Epic    Threatened with loss of utilities: No  Health Literacy: Not on file   Past Medical History:  Past Medical History:  Diagnosis Date   Depression    Hypertension     Past Surgical History:  Procedure Laterality Date   BRAIN  SURGERY     BREAST BIOPSY Left 2011   CORE W/CLIP   EYE SURGERY Bilateral    glaucoma   TUBAL LIGATION      Current Medications: Current Facility-Administered Medications  Medication Dose Route Frequency Provider Last Rate Last Admin   acetaminophen  (TYLENOL ) tablet 650 mg  650 mg Oral Q4H PRN [name], [name], MD       albuterol  (PROVENTIL ) (2.5 MG/3ML) 0.083% nebulizer solution 3 mL  3 mL Inhalation Q4H PRN [name], [name], MD       alum & mag hydroxide-simeth (MAALOX/MYLANTA) 200-200-20 MG/5ML suspension 30 mL  30 mL Oral Q4H PRN [name], [name], MD   30 mL at 03/28/24 1848   amLODipine  (NORVASC ) tablet 10 mg  10 mg Oral Daily [name], [name], MD   10 mg at 03/29/24 9083   FLUoxetine  (PROZAC ) capsule 40 mg  40 mg Oral q morning  Hazelynn Mckenny, MD   40 mg at 03/29/24 9082   folic acid  (FOLVITE ) tablet 1 mg  1 mg Oral Daily Alijah Hyde, MD   1 mg at 03/29/24 9082   hydrochlorothiazide  (HYDRODIURIL ) tablet 25 mg  25 mg Oral Daily Nashaly Dorantes, MD   25 mg at 03/29/24 9082   magnesium  hydroxide (MILK OF MAGNESIA) suspension 30 mL  30 mL Oral Daily PRN [name], [name], MD       nicotine  (NICODERM CQ  - dosed in mg/24 hours) patch 14 mg  14 mg Transdermal Daily Treshawn Allen, MD   14 mg at 03/29/24 1415   OLANZapine  (ZYPREXA ) tablet 5 mg  5 mg Oral Q8H PRN Aira Sallade, MD   5 mg at 03/29/24 1318   Or   OLANZapine  (ZYPREXA ) injection 5 mg  5 mg Intramuscular Q8H PRN Giles Currie, MD       ondansetron  (ZOFRAN -ODT) disintegrating tablet 4 mg  4 mg Oral Q8H PRN Athen Riel, MD       pantoprazole  (PROTONIX ) EC tablet 40 mg  40 mg Oral Daily [name], [name], MD   40 mg at 03/29/24 0917   [START ON 03/30/2024] risperiDONE  (RISPERDAL ) tablet 2 mg  2 mg Oral BID Bartlett Enke, MD       thiamine  (VITAMIN B1) tablet 100 mg  100 mg Oral Daily Ysela Hettinger, MD   100 mg at 03/29/24 9083    Lab Results:  No results found for this or any previous visit (from the past 48 hours).   Blood Alcohol level:  Lab Results  Component Value Date   [facility] <15 03/25/2024   ETH <10 03/03/2020    Metabolic Disorder Labs: Lab Results  Component Value Date   HGBA1C 5.4 11/24/2023   MPG 108 11/24/2023   No results found for: PROLACTIN Lab Results  Component Value Date   CHOL 242 (H) 11/24/2023   TRIG 97 11/24/2023   HDL 69 11/24/2023   CHOLHDL 3.5 11/24/2023   LDLCALC 152 (H) 11/24/2023    Physical Findings: AIMS:  , ,  ,  ,    CIWA:    COWS:      Psychiatric Specialty Exam:  Presentation  General Appearance:  Appropriate for Environment  Eye Contact: Fair  Speech: Clear and Coherent  Speech Volume: Normal    Mood and Affect  Mood: Labile; Irritable  Affect: Labile   Thought Process   Thought Processes: Disorganized  Orientation:Partial  Thought Content:Illogical; Paranoid Ideation; Delusions  Hallucinations: Auditory hallucinations patient responding to internal stimuli  Ideas of Reference:Delusions  Suicidal Thoughts: Denies  Homicidal Thoughts: Denies   Sensorium  Memory: Remote Poor; Immediate Poor  Judgment: Impaired  Insight: Shallow   Executive Functions  Concentration: Poor  Attention Span: Poor  Recall: -- (unable to assess)  Fund of Knowledge: -- (unable to assess)  Language: -- (unable to assess)   Psychomotor Activity  Psychomotor Activity: No data recorded  Musculoskeletal: Strength & Muscle Tone: within normal limits Gait & Station: normal Assets  Assets: Social Support    Physical Exam: Physical Exam ROS Blood pressure 139/88, pulse 78, temperature 98.8 F (37.1 C), resp. rate 18, height 5' 7.01 (1.702 m), weight 60.1 kg, SpO2 100%. Body mass index is 20.75 kg/m.  Diagnosis: Active Problems:   MDD (major depressive disorder), recurrent, severe, with psychosis (HCC) R/O    4. Discharge Planning:   -- Social work and case management to assist with discharge planning and identification of hospital follow-up needs prior to discharge  -- Estimated LOS: 3-4 days  Daphna Lafuente, MD 03/29/2024, 11:00 PM

## 2024-03-29 NOTE — Progress Notes (Signed)
" °   03/29/24 0148  Psych Admission Type (Psych Patients Only)  Admission Status Voluntary  Psychosocial Assessment  Patient Complaints Isolation  Eye Contact Suspiciousness  Facial Expression Animated  Affect Labile  Speech Pressured  Interaction Assertive;Attention-seeking  Motor Activity Restless  Appearance/Hygiene Poor hygiene  Behavior Characteristics Intrusive;Impulsive  Mood Labile  Thought Process  Coherency Disorganized  Content Paranoia;Delusions  Delusions UTA  Perception Hallucinations  Hallucination Auditory  Judgment Poor  Confusion Moderate  Danger to Self  Current suicidal ideation? Denies  Danger to Others  Danger to Others None reported or observed    "

## 2024-03-29 NOTE — Group Note (Signed)
 Date:  03/29/2024 Time:  10:59 PM  Group Topic/Focus:  Wrap-Up Group:   The focus of this group is to help patients review their daily goal of treatment and discuss progress on daily workbooks.  Boundaries Group  Participation Level:  Active  Participation Quality:  Appropriate  Affect:  Appropriate  Cognitive:  Appropriate  Insight: Appropriate  Engagement in Group:  Engaged  Modes of Intervention:  Discussion  Additional Comments:    Gwendlyn LITTIE Sharps 03/29/2024, 10:59 PM

## 2024-03-29 NOTE — Progress Notes (Signed)
 Patient agitated at Peer You came to my room and knocked at my door Patient mumbled some curse words to the Female Peer. Patient continue to curse at Peers and Staff she is very suspicious. The Peer had not gone to the Patient room at all. Prn Zyprexa  given this PM and effective. Patient required lots of redirection this shift. Appears to be responding to internal Stimuli ate all her meals in the room. Patient has incontinent episode clean clothes provided and Patient took a shower. Support provided as needed.

## 2024-03-29 NOTE — Plan of Care (Signed)
  Problem: Activity: Goal: Imbalance in normal sleep/wake cycle will improve Outcome: Progressing   Problem: Coping: Goal: Coping ability will improve Outcome: Progressing   

## 2024-03-29 NOTE — Group Note (Signed)
 Recreation Therapy Group Note   Group Topic:Stress Management  Group Date: 03/29/2024 Start Time: 1415 End Time: 1440 Facilitators: Celestia Jeoffrey BRAVO, LRT, CTRS Location: Dayroom  Group Description: PMR (Progressive Muscle Relaxation). LRT educates patients on what PMR is and the benefits that come from it. Patients are asked to sit with their feet flat on the floor while sitting up and all the way back in their chair, if possible. LRT and pts follow a prompt through a speaker that requires you to tense and release different muscles in their body and focus on their breathing. During session, lights are off and soft music is being played. Pts are given a stress ball to use if needed.   Goal Area(s) Addressed:  Patients will be able to describe progressive muscle relaxation.  Patient will practice using relaxation technique. Patient will identify a new coping skill.  Patient will follow multistep directions to reduce anxiety and stress.   Affect/Mood: Appropriate and Flat   Participation Level: Non-verbal    Clinical Observations/Individualized Feedback: [name] was present in group. Pt did not interact with LRT or peers. Pt did not follow along to the prompts.   Plan: Continue to engage patient in RT group sessions 2-3x/week.   Jeoffrey BRAVO Celestia, LRT, CTRS 03/29/2024 4:41 PM

## 2024-03-29 NOTE — Group Note (Signed)
 Date:  03/29/2024 Time:  10:54 AM  Group Topic/Focus:  Self Care:   The focus of this group is to help patients understand the importance of self-care in order to improve or restore emotional, physical, spiritual, interpersonal, and financial health.    Participation Level:  Did Not Attend  Participation Quality:    Affect:    Cognitive:    Insight:   Engagement in Group:    Modes of Intervention:    Additional Comments:    Emric Kowalewski 03/29/2024, 10:54 AM

## 2024-03-30 DIAGNOSIS — F333 Major depressive disorder, recurrent, severe with psychotic symptoms: Secondary | ICD-10-CM

## 2024-03-30 NOTE — Group Note (Signed)
 Date:  03/30/2024 Time:  11:04 AM  Group Topic/Focus:  Orientation:   The focus of this group is to educate the patient on the purpose and policies of crisis stabilization and provide a format to answer questions about their admission.  The group details unit policies and expectations of patients while admitted. Wellness Toolbox:   The focus of this group is to discuss various aspects of wellness, balancing those aspects and exploring ways to increase the ability to experience wellness.  Patients will create a wellness toolbox for use upon discharge.    Participation Level:  Did Not Attend   Maglione,Hardeep Reetz E 03/30/2024, 11:04 AM

## 2024-03-30 NOTE — Group Note (Signed)
 Recreation Therapy Group Note   Group Topic:Leisure Education  Group Date: 03/30/2024 Start Time: 1415 End Time: 1500 Facilitators: Celestia Jeoffrey BRAVO, LRT, CTRS Location: Dayroom  Group Description: General Recreation. Patients were given the opportunity to play cards, journal, or color mandalas during group. Pt identified and conversated about things they enjoy doing in their free time and how they can continue to do that outside of the hospital.  Goal Area(s) Addressed: Patient will practice making a positive decision. Patient will have the opportunity to try a new leisure activity. Patient will communicate with peers and LRT.   Affect/Mood: N/A   Participation Level: Did not attend    Clinical Observations/Individualized Feedback: Patient did not attend.  Plan: Continue to engage patient in RT group sessions 2-3x/week.   Jeoffrey BRAVO Celestia, LRT, CTRS 03/30/2024 4:59 PM

## 2024-03-30 NOTE — Group Note (Signed)

## 2024-03-30 NOTE — Progress Notes (Signed)
" °   03/30/24 2200  Psych Admission Type (Psych Patients Only)  Admission Status Voluntary  Psychosocial Assessment  Patient Complaints Isolation  Eye Contact Fair  Facial Expression Flat  Affect Depressed  Speech Soft  Interaction Isolative  Motor Activity Slow  Appearance/Hygiene In scrubs;Poor hygiene  Behavior Characteristics Irritable  Mood Irritable  Thought Process  Coherency Disorganized  Content Paranoia  Delusions None reported or observed  Perception Hallucinations  Hallucination Auditory  Judgment Poor  Confusion Moderate  Danger to Self  Current suicidal ideation? Denies  Danger to Others  Danger to Others None reported or observed    "

## 2024-03-30 NOTE — Progress Notes (Signed)
 [facility] MD Progress Note  03/30/2024 10:15 PM [name]  MRN:  [id number]  daughter: [name] [phone] provider and daughter discussed the treatment plan extensively and updated on medications Subjective:  Chart reviewed, case discussed in multidisciplinary meeting, patient seen during rounds.   Patient is known to be sitting in her room.  She reports feeling up and down.  She reports PT in her room.  Patient tried to get out of her bed and was very unstable on her feet.  Patient reports feeling dizzy.  Patient is encouraged to sit down and stay hydrated.  Patient talks about getting injections yesterday.  Patient reports ongoing hallucinations.  Per nursing staff patient remains confused and sundowning in the evening.  Patient denies SI/HI.  Patient reports reaching out to her daughter over the phone but was upset that no family member visited her in the hospital.  Will request PT to evaluate the patient for unstable gait and also continue to monitor for the side effects of Risperdal .  Nursing is educated in minimizing the PRNs  Past Psychiatric History: see h&P Family History: History reviewed. No pertinent family history. Social History:  Social History   Substance and Sexual Activity  Alcohol Use Yes   Comment: 3-4 40 oz beers per week - occasionally     Social History   Substance and Sexual Activity  Drug Use No    Social History   Socioeconomic History   Marital status: Widowed    Spouse name: Not on file   Number of children: Not on file   Years of education: Not on file   Highest education level: High school graduate  Occupational History   Not on file  Tobacco Use   Smoking status: Every Day    Current packs/day: 0.33    Types: Cigarettes   Smokeless tobacco: Never  Vaping Use   Vaping status: Never Used  Substance and Sexual Activity   Alcohol use: Yes    Comment: 3-4 40 oz beers per week - occasionally   Drug use: No   Sexual activity: Not Currently   Other Topics Concern   Not on file  Social History Narrative   Not on file   Social Drivers of Health   Tobacco Use: High Risk (03/25/2024)   Patient History    Smoking Tobacco Use: Every Day    Smokeless Tobacco Use: Never    Passive Exposure: Not on file  Financial Resource Strain: Not on file  Food Insecurity: No Food Insecurity (03/26/2024)   Epic    Worried About Programme Researcher, Broadcasting/film/video in the Last Year: Never true    Ran Out of Food in the Last Year: Never true  Transportation Needs: No Transportation Needs (03/26/2024)   Epic    Lack of Transportation (Medical): No    Lack of Transportation (Non-Medical): No  Physical Activity: Not on file  Stress: Not on file  Social Connections: Not on file  Depression (PHQ2-9): Medium Risk (11/24/2023)   Depression (PHQ2-9)    PHQ-2 Score: 5  Alcohol Screen: Low Risk (03/29/2024)   Alcohol Screen    Last Alcohol Screening Score (AUDIT): 0  Housing: Low Risk (03/26/2024)   Epic    Unable to Pay for Housing in the Last Year: No    Number of Times Moved in the Last Year: 0    Homeless in the Last Year: No  Utilities: Not At Risk (03/26/2024)   Epic    Threatened with loss of utilities: No  Health Literacy: Not on file   Past Medical History:  Past Medical History:  Diagnosis Date   Depression    Hypertension     Past Surgical History:  Procedure Laterality Date   BRAIN SURGERY     BREAST BIOPSY Left 2011   CORE W/CLIP   EYE SURGERY Bilateral    glaucoma   TUBAL LIGATION      Current Medications: Current Facility-Administered Medications  Medication Dose Route Frequency Provider Last Rate Last Admin   acetaminophen  (TYLENOL ) tablet 650 mg  650 mg Oral Q4H PRN [name], [name], MD   650 mg at 03/30/24 2046   albuterol  (PROVENTIL ) (2.5 MG/3ML) 0.083% nebulizer solution 3 mL  3 mL Inhalation Q4H PRN [name], [name], MD       alum & mag hydroxide-simeth (MAALOX/MYLANTA) 200-200-20 MG/5ML suspension 30 mL  30 mL Oral Q4H PRN [name],  [name], MD   30 mL at 03/28/24 1848   amLODipine  (NORVASC ) tablet 10 mg  10 mg Oral Daily [name], [name], MD   10 mg at 03/30/24 9062   FLUoxetine  (PROZAC ) capsule 40 mg  40 mg Oral q morning Langley Flatley, MD   40 mg at 03/30/24 9062   folic acid  (FOLVITE ) tablet 1 mg  1 mg Oral Daily Odella Appelhans, MD   1 mg at 03/30/24 9061   hydrochlorothiazide  (HYDRODIURIL ) tablet 25 mg  25 mg Oral Daily Ralston Venus, MD   25 mg at 03/30/24 9062   magnesium  hydroxide (MILK OF MAGNESIA) suspension 30 mL  30 mL Oral Daily PRN [name], [name], MD       nicotine  (NICODERM CQ  - dosed in mg/24 hours) patch 14 mg  14 mg Transdermal Daily Labrenda Lasky, MD   14 mg at 03/30/24 9057   OLANZapine  (ZYPREXA ) tablet 5 mg  5 mg Oral Q8H PRN Dorothye Berni, MD   5 mg at 03/29/24 1318   Or   OLANZapine  (ZYPREXA ) injection 5 mg  5 mg Intramuscular Q8H PRN Lurena Naeve, MD       ondansetron  (ZOFRAN -ODT) disintegrating tablet 4 mg  4 mg Oral Q8H PRN Levaeh Vice, MD       pantoprazole  (PROTONIX ) EC tablet 40 mg  40 mg Oral Daily [name], [name], MD   40 mg at 03/30/24 9062   risperiDONE  (RISPERDAL ) tablet 2 mg  2 mg Oral BID Skilynn Durney, MD   2 mg at 03/30/24 2044   thiamine  (VITAMIN B1) tablet 100 mg  100 mg Oral Daily Bawi Lakins, MD   100 mg at 03/30/24 9062    Lab Results:  No results found for this or any previous visit (from the past 48 hours).   Blood Alcohol level:  Lab Results  Component Value Date   [facility] <15 03/25/2024   ETH <10 03/03/2020    Metabolic Disorder Labs: Lab Results  Component Value Date   HGBA1C 5.4 11/24/2023   MPG 108 11/24/2023   No results found for: PROLACTIN Lab Results  Component Value Date   CHOL 242 (H) 11/24/2023   TRIG 97 11/24/2023   HDL 69 11/24/2023   CHOLHDL 3.5 11/24/2023   LDLCALC 152 (H) 11/24/2023    Physical Findings: AIMS:  , ,  ,  ,    CIWA:    COWS:      Psychiatric Specialty Exam:  Presentation  General Appearance:   Appropriate for Environment  Eye Contact: Fair  Speech: Clear and Coherent  Speech Volume: Normal  Mood and Affect  Mood: Labile; Irritable  Affect: Labile   Thought Process  Thought Processes: Disorganized  Orientation:Partial  Thought Content:Illogical; Paranoid Ideation; Delusions  Hallucinations: Auditory hallucinations patient responding to internal stimuli  Ideas of Reference:Delusions  Suicidal Thoughts: Denies  Homicidal Thoughts: Denies   Sensorium  Memory: Remote Poor; Immediate Poor  Judgment: Impaired  Insight: Shallow   Executive Functions  Concentration: Poor  Attention Span: Poor  Recall: -- (unable to assess)  Fund of Knowledge: -- (unable to assess)  Language: -- (unable to assess)   Psychomotor Activity  Psychomotor Activity: No data recorded  Musculoskeletal: Strength & Muscle Tone: within normal limits Gait & Station: normal Assets  Assets: Social Support    Physical Exam: Physical Exam ROS Blood pressure (!) 134/91, pulse 86, temperature 98.6 F (37 C), resp. rate 16, height 5' 7.01 (1.702 m), weight 60.1 kg, SpO2 100%. Body mass index is 20.75 kg/m.  Diagnosis: Active Problems:   MDD (major depressive disorder), recurrent, severe, with psychosis (HCC) R/O   Treatment Plan Summary:  Safety and Monitoring:             -- Voluntary admission to inpatient psychiatric unit for safety, stabilization and treatment             -- Daily contact with patient to assess and evaluate symptoms and progress in treatment             -- Patient's case to be discussed in multi-disciplinary team meeting             -- Observation Level: q15 minute checks             -- Vital signs:  q12 hours             -- Precautions: suicide, elopement, and assault   2. Psychiatric Diagnoses and Treatment:              Prozac  40 mg daily  risperdal  2mg  BID   -- The risks/benefits/side-effects/alternatives to this  medication were discussed in detail with the patient and time was given for questions. The patient consents to medication trial.                -- Metabolic profile and EKG monitoring obtained while on an atypical antipsychotic (BMI: Lipid Panel: HbgA1c: QTc:)              -- Encouraged patient to participate in unit milieu and in scheduled group therapies                            3. Medical Issues Being Addressed:  4. Discharge Planning:   -- Social work and case management to assist with discharge planning and identification of hospital follow-up needs prior to discharge  -- Estimated LOS: 3-4 days  Allyn Foil, MD 03/30/2024, 10:15 PM

## 2024-03-30 NOTE — Group Note (Signed)
 Date:  03/30/2024 Time:  9:18 PM  Group Topic/Focus:  Wrap-Up Group:   The focus of this group is to help patients review their daily goal of treatment and discuss progress on daily workbooks.    Participation Level:  Did Not Attend  Participation Quality:     Affect:     Cognitive:     Insight: None  Engagement in Group:  None  Modes of Intervention:     Additional Comments:    [name] 03/30/2024, 9:18 PM

## 2024-03-30 NOTE — Plan of Care (Signed)
   Problem: Education: Goal: Ability to state activities that reduce stress will improve Outcome: Progressing   Problem: Coping: Goal: Ability to identify and develop effective coping behavior will improve Outcome: Progressing

## 2024-03-31 DIAGNOSIS — F333 Major depressive disorder, recurrent, severe with psychotic symptoms: Secondary | ICD-10-CM | POA: Diagnosis not present

## 2024-03-31 NOTE — Plan of Care (Signed)
°  Problem: Self-Concept: Goal: Level of anxiety will decrease Outcome: Not Progressing   Problem: Activity: Goal: Interest or engagement in leisure activities will improve Outcome: Not Progressing   Problem: Coping: Goal: Will verbalize feelings Outcome: Not Progressing

## 2024-03-31 NOTE — Group Note (Signed)
 Date:  03/31/2024 Time:  4:03 PM  Group Topic/Focus:  Goals Group:   The focus of this group is to help patients establish daily goals to achieve during treatment and discuss how the patient can incorporate goal setting into their daily lives to aide in recovery.    Participation Level:  Active  Participation Quality:  Appropriate  Affect:  Appropriate  Cognitive:  Appropriate  Insight: Appropriate  Engagement in Group:  Engaged  Modes of Intervention:  Discussion   [name] 03/31/2024, 4:03 PM

## 2024-03-31 NOTE — Group Note (Signed)
 Date:  03/31/2024 Time:  9:45 PM  Group Topic/Focus:  Wrap-Up Group:   The focus of this group is to help patients review their daily goal of treatment and discuss progress on daily workbooks.    Participation Level:   Did Not Attend Participation Quality:     Affect:     Cognitive:     Insight: None  Engagement in Group:     Modes of Intervention:     Additional Comments:    Isobel Eisenhuth C Hailey Miles 03/31/2024, 9:45 PM

## 2024-03-31 NOTE — Plan of Care (Signed)
   Problem: Education: Goal: Utilization of techniques to improve thought processes will improve Outcome: Progressing Goal: Knowledge of the prescribed therapeutic regimen will improve Outcome: Progressing

## 2024-03-31 NOTE — Group Note (Signed)
 Date:  03/31/2024 Time:  10:23 AM  Group Topic/Focus:  Orientation:   The focus of this group is to educate the patient on the purpose and policies of crisis stabilization and provide a format to answer questions about their admission.  The group details unit policies and expectations of patients while admitted.  Effective geriatric exercises focus on aerobic activity, strength, balance, and flexibility, including brisk walking, swimming, chair yoga, tai chi, using resistance bands, and simple bodyweight moves (like chair squats, wall push-ups) to boost heart health, prevent falls, maintain independence, and improve overall well-being, aiming for 150 mins of moderate aerobic activity and 2+ days of strength training weekly.   Participation Level:  Did Not Attend   [name] Gavel 03/31/2024, 10:23 AM

## 2024-03-31 NOTE — [facility] Group Notes (Signed)
 Spirituality Group   Description: Participant directed exploration of values, beliefs and meaning   **Focus on Gratitude: Invite reflection on sources of gratitude (external/internal); goal to invite internal gratitude to foster 1) reconnection with life-giving activities 2) self-compassion.   Following a brief framework of chaplains role and ground rules of group behavior, participants are invited to share concerns or questions that engage spiritual life. Emphasis placed on common themes and shared experiences and ways to make meaning and clarify living into ones values.   Theory/Process/Goal: Utilize the theoretical framework of group therapy established by Celena Kite, Relational Cultural Theory and Rogerian approaches to facilitate relational empathy and use of the here and now to foster reflection, self-awareness, and sharing.   Observations: Ms Stamp was an active participant in the group for about 0.5 of the hour.  Sedrick Tober L. Delores HERO.Div

## 2024-03-31 NOTE — Progress Notes (Signed)
 [facility] MD Progress Note  03/31/2024 8:53 PM [name]  MRN:  [id number]  daughter: [name] [phone] provider and daughter discussed the treatment plan extensively and updated on medications Subjective:  Chart reviewed, case discussed in multidisciplinary meeting, patient seen during rounds.   Patient is noted to be resting in bed.  She offers no complaints.  She remains more stable on her feet today.  Patient is encouraged to stay hydrated and keep up with the nutrition.  Patient reports improvement in her voices.  She is maintaining safe behaviors.  She denies SI/HI/plan.  Past Psychiatric History: see h&P Family History: History reviewed. No pertinent family history. Social History:  Social History   Substance and Sexual Activity  Alcohol Use Yes   Comment: 3-4 40 oz beers per week - occasionally     Social History   Substance and Sexual Activity  Drug Use No    Social History   Socioeconomic History   Marital status: Widowed    Spouse name: Not on file   Number of children: Not on file   Years of education: Not on file   Highest education level: High school graduate  Occupational History   Not on file  Tobacco Use   Smoking status: Every Day    Current packs/day: 0.33    Types: Cigarettes   Smokeless tobacco: Never  Vaping Use   Vaping status: Never Used  Substance and Sexual Activity   Alcohol use: Yes    Comment: 3-4 40 oz beers per week - occasionally   Drug use: No   Sexual activity: Not Currently  Other Topics Concern   Not on file  Social History Narrative   Not on file   Social Drivers of Health   Tobacco Use: High Risk (03/25/2024)   Patient History    Smoking Tobacco Use: Every Day    Smokeless Tobacco Use: Never    Passive Exposure: Not on file  Financial Resource Strain: Not on file  Food Insecurity: No Food Insecurity (03/26/2024)   Epic    Worried About Programme Researcher, Broadcasting/film/video in the Last Year: Never true    Ran Out of Food in the  Last Year: Never true  Transportation Needs: No Transportation Needs (03/26/2024)   Epic    Lack of Transportation (Medical): No    Lack of Transportation (Non-Medical): No  Physical Activity: Not on file  Stress: Not on file  Social Connections: Not on file  Depression (PHQ2-9): Medium Risk (11/24/2023)   Depression (PHQ2-9)    PHQ-2 Score: 5  Alcohol Screen: Low Risk (03/29/2024)   Alcohol Screen    Last Alcohol Screening Score (AUDIT): 0  Housing: Low Risk (03/26/2024)   Epic    Unable to Pay for Housing in the Last Year: No    Number of Times Moved in the Last Year: 0    Homeless in the Last Year: No  Utilities: Not At Risk (03/26/2024)   Epic    Threatened with loss of utilities: No  Health Literacy: Not on file   Past Medical History:  Past Medical History:  Diagnosis Date   Depression    Hypertension     Past Surgical History:  Procedure Laterality Date   BRAIN SURGERY     BREAST BIOPSY Left 2011   CORE W/CLIP   EYE SURGERY Bilateral    glaucoma   TUBAL LIGATION      Current Medications: Current Facility-Administered Medications  Medication Dose Route Frequency Provider Last Rate  Last Admin   acetaminophen  (TYLENOL ) tablet 650 mg  650 mg Oral Q4H PRN [name], [name], MD   650 mg at 03/30/24 2046   albuterol  (PROVENTIL ) (2.5 MG/3ML) 0.083% nebulizer solution 3 mL  3 mL Inhalation Q4H PRN [name], [name], MD       alum & mag hydroxide-simeth (MAALOX/MYLANTA) 200-200-20 MG/5ML suspension 30 mL  30 mL Oral Q4H PRN [name], [name], MD   30 mL at 03/28/24 1848   amLODipine  (NORVASC ) tablet 10 mg  10 mg Oral Daily [name], [name], MD   10 mg at 03/31/24 1036   FLUoxetine  (PROZAC ) capsule 40 mg  40 mg Oral q morning Vassie Kugel, MD   40 mg at 03/31/24 1036   folic acid  (FOLVITE ) tablet 1 mg  1 mg Oral Daily Neill Jurewicz, MD   1 mg at 03/31/24 1036   hydrochlorothiazide  (HYDRODIURIL ) tablet 25 mg  25 mg Oral Daily Kimberley Speece, MD   25 mg at 03/31/24 1036    magnesium  hydroxide (MILK OF MAGNESIA) suspension 30 mL  30 mL Oral Daily PRN [name], [name], MD       nicotine  (NICODERM CQ  - dosed in mg/24 hours) patch 14 mg  14 mg Transdermal Daily Luzmaria Devaux, MD   14 mg at 03/31/24 1040   OLANZapine  (ZYPREXA ) tablet 5 mg  5 mg Oral Q8H PRN Christan Ciccarelli, MD   5 mg at 03/29/24 1318   Or   OLANZapine  (ZYPREXA ) injection 5 mg  5 mg Intramuscular Q8H PRN Laylanie Kruczek, MD       ondansetron  (ZOFRAN -ODT) disintegrating tablet 4 mg  4 mg Oral Q8H PRN Margean Korell, MD       pantoprazole  (PROTONIX ) EC tablet 40 mg  40 mg Oral Daily [name], [name], MD   40 mg at 03/31/24 1036   risperiDONE  (RISPERDAL ) tablet 2 mg  2 mg Oral BID Crystin Lechtenberg, MD   2 mg at 03/31/24 1036   thiamine  (VITAMIN B1) tablet 100 mg  100 mg Oral Daily Kamariyah Timberlake, MD   100 mg at 03/31/24 1036    Lab Results:  No results found for this or any previous visit (from the past 48 hours).   Blood Alcohol level:  Lab Results  Component Value Date   [facility] <15 03/25/2024   ETH <10 03/03/2020    Metabolic Disorder Labs: Lab Results  Component Value Date   HGBA1C 5.4 11/24/2023   MPG 108 11/24/2023   No results found for: PROLACTIN Lab Results  Component Value Date   CHOL 242 (H) 11/24/2023   TRIG 97 11/24/2023   HDL 69 11/24/2023   CHOLHDL 3.5 11/24/2023   LDLCALC 152 (H) 11/24/2023    Physical Findings: AIMS:  , ,  ,  ,    CIWA:    COWS:      Psychiatric Specialty Exam:  Presentation  General Appearance:  Appropriate for Environment  Eye Contact: Fair  Speech: Clear and Coherent  Speech Volume: Normal    Mood and Affect  Mood: Labile; Irritable  Affect: Labile   Thought Process  Thought Processes: Disorganized  Orientation:Partial  Thought Content:Illogical; Paranoid Ideation; Delusions  Hallucinations: Auditory hallucinations patient responding to internal stimuli  Ideas of Reference:Delusions  Suicidal Thoughts:  Denies  Homicidal Thoughts: Denies   Sensorium  Memory: Remote Poor; Immediate Poor  Judgment: Impaired  Insight: Shallow   Executive Functions  Concentration: Poor  Attention Span: Poor  Recall: -- (unable to assess)  [employer]  of Knowledge: -- (unable to assess)  Language: -- (unable to assess)   Psychomotor Activity  Psychomotor Activity: No data recorded  Musculoskeletal: Strength & Muscle Tone: within normal limits Gait & Station: normal Assets  Assets: Social Support    Physical Exam: Physical Exam ROS Blood pressure (!) 142/90, pulse 83, temperature 99.4 F (37.4 C), temperature source Oral, resp. rate 17, height 5' 7.01 (1.702 m), weight 60.1 kg, SpO2 100%. Body mass index is 20.75 kg/m.  Diagnosis: Active Problems:   MDD (major depressive disorder), recurrent, severe, with psychosis (HCC) R/O   Treatment Plan Summary:  Safety and Monitoring:             -- Voluntary admission to inpatient psychiatric unit for safety, stabilization and treatment             -- Daily contact with patient to assess and evaluate symptoms and progress in treatment             -- Patient's case to be discussed in multi-disciplinary team meeting             -- Observation Level: q15 minute checks             -- Vital signs:  q12 hours             -- Precautions: suicide, elopement, and assault   2. Psychiatric Diagnoses and Treatment:              Prozac  40 mg daily  risperdal  2mg  BID   -- The risks/benefits/side-effects/alternatives to this medication were discussed in detail with the patient and time was given for questions. The patient consents to medication trial.                -- Metabolic profile and EKG monitoring obtained while on an atypical antipsychotic (BMI: Lipid Panel: HbgA1c: QTc:)              -- Encouraged patient to participate in unit milieu and in scheduled group therapies                            3. Medical Issues Being Addressed:  4.  Discharge Planning:   -- Social work and case management to assist with discharge planning and identification of hospital follow-up needs prior to discharge  -- Estimated LOS: 3-4 days  Allyn Foil, MD 03/31/2024, 8:53 PM

## 2024-03-31 NOTE — BH IP Treatment Plan (Signed)
 Interdisciplinary Treatment and Diagnostic Plan Update  03/31/2024 Time of Session: 1:45 PM  [name] MRN: [id number]  Principal Diagnosis: <principal problem not specified>  Secondary Diagnoses: Active Problems:   MDD (major depressive disorder), recurrent, severe, with psychosis (HCC)   Current Medications:  Current Facility-Administered Medications  Medication Dose Route Frequency Provider Last Rate Last Admin   acetaminophen  (TYLENOL ) tablet 650 mg  650 mg Oral Q4H PRN [name], [name], MD   650 mg at 03/30/24 2046   albuterol  (PROVENTIL ) (2.5 MG/3ML) 0.083% nebulizer solution 3 mL  3 mL Inhalation Q4H PRN [name], [name], MD       alum & mag hydroxide-simeth (MAALOX/MYLANTA) 200-200-20 MG/5ML suspension 30 mL  30 mL Oral Q4H PRN [name], [name], MD   30 mL at 03/28/24 1848   amLODipine  (NORVASC ) tablet 10 mg  10 mg Oral Daily [name], [name], MD   10 mg at 03/31/24 1036   FLUoxetine  (PROZAC ) capsule 40 mg  40 mg Oral q morning [name], [name], MD   40 mg at 03/31/24 1036   folic acid  (FOLVITE ) tablet 1 mg  1 mg Oral Daily [name], [name], MD   1 mg at 03/31/24 1036   hydrochlorothiazide  (HYDRODIURIL ) tablet 25 mg  25 mg Oral Daily [name], [name], MD   25 mg at 03/31/24 1036   magnesium  hydroxide (MILK OF MAGNESIA) suspension 30 mL  30 mL Oral Daily PRN [name], [name], MD       nicotine  (NICODERM CQ  - dosed in mg/24 hours) patch 14 mg  14 mg Transdermal Daily [name], [name], MD   14 mg at 03/31/24 1040   OLANZapine  (ZYPREXA ) tablet 5 mg  5 mg Oral Q8H PRN [name], [name], MD   5 mg at 03/29/24 1318   Or   OLANZapine  (ZYPREXA ) injection 5 mg  5 mg Intramuscular Q8H PRN [name], [name], MD       ondansetron  (ZOFRAN -ODT) disintegrating tablet 4 mg  4 mg Oral Q8H PRN [name], [name], MD       pantoprazole  (PROTONIX ) EC tablet 40 mg  40 mg Oral Daily [name], [name], MD   40 mg at 03/31/24 1036   risperiDONE  (RISPERDAL ) tablet 2 mg  2 mg Oral BID [name],  [name], MD   2 mg at 03/31/24 1036   thiamine  (VITAMIN B1) tablet 100 mg  100 mg Oral Daily [name], [name], MD   100 mg at 03/31/24 1036   PTA Medications: Medications Prior to Admission  Medication Sig Dispense Refill Last Dose/Taking   amLODipine  (NORVASC ) 5 MG tablet Take 1 tablet (5 mg total) by mouth daily. 90 tablet 1    FLUoxetine  (PROZAC ) 40 MG capsule Take 1 capsule (40 mg total) by mouth once daily every morning. 90 capsule 1    hydrochlorothiazide  (HYDRODIURIL ) 25 MG tablet Take 1 tablet (25 mg total) by mouth daily. 90 tablet 1    meloxicam  (MOBIC ) 15 MG tablet Take 1 tablet (15 mg total) by mouth daily as needed for pain. 90 tablet 1    omeprazole  (PRILOSEC) 40 MG capsule Take 1 capsule (40 mg total) by mouth daily before breakfast. 90 capsule 1    ondansetron  (ZOFRAN -ODT) 4 MG disintegrating tablet Take 1 tablet (4 mg total) by mouth every 8 (eight) hours as needed for nausea or vomiting. 30 tablet 2    Sofosbuvir -Velpatasvir  (EPCLUSA ) 400-100 MG TABS Take 1 tablet by mouth daily. 28 tablet 2     Patient Stressors:    Patient Strengths:  Treatment Modalities: Medication Management, Group therapy, Case management,  1 to 1 session with clinician, Psychoeducation, Recreational therapy.   Physician Treatment Plan for Primary Diagnosis: <principal problem not specified> Long Term Goal(s): Improvement in symptoms so as ready for discharge   Short Term Goals: Unable to get a reliable answer from patient.  Ability to identify changes in lifestyle to reduce recurrence of condition will improve Ability to verbalize feelings will improve Ability to disclose and discuss suicidal ideas Ability to demonstrate self-control will improve  Medication Management: Evaluate patient's response, side effects, and tolerance of medication regimen.  Therapeutic Interventions: 1 to 1 sessions, Unit Group sessions and Medication administration.  Evaluation of Outcomes: Progressing  Physician  Treatment Plan for Secondary Diagnosis: Active Problems:   MDD (major depressive disorder), recurrent, severe, with psychosis (HCC)  Long Term Goal(s): Improvement in symptoms so as ready for discharge   Short Term Goals: Unable to get a reliable answer from patient.  Ability to identify changes in lifestyle to reduce recurrence of condition will improve Ability to verbalize feelings will improve Ability to disclose and discuss suicidal ideas Ability to demonstrate self-control will improve     Medication Management: Evaluate patient's response, side effects, and tolerance of medication regimen.  Therapeutic Interventions: 1 to 1 sessions, Unit Group sessions and Medication administration.  Evaluation of Outcomes: Progressing   RN Treatment Plan for Primary Diagnosis: <principal problem not specified> Long Term Goal(s): Knowledge of disease and therapeutic regimen to maintain health will improve  Short Term Goals: Ability to remain free from injury will improve, Ability to verbalize frustration and anger appropriately will improve, Ability to demonstrate self-control, Ability to participate in decision making will improve, Ability to verbalize feelings will improve, Ability to disclose and discuss suicidal ideas, Ability to identify and develop effective coping behaviors will improve, and Compliance with prescribed medications will improve  Medication Management: RN will administer medications as ordered by provider, will assess and evaluate patient's response and provide education to patient for prescribed medication. RN will report any adverse and/or side effects to prescribing provider.  Therapeutic Interventions: 1 on 1 counseling sessions, Psychoeducation, Medication administration, Evaluate responses to treatment, Monitor vital signs and CBGs as ordered, Perform/monitor CIWA, COWS, AIMS and Fall Risk screenings as ordered, Perform wound care treatments as ordered.  Evaluation of  Outcomes: Progressing   LCSW Treatment Plan for Primary Diagnosis: <principal problem not specified> Long Term Goal(s): Safe transition to appropriate next level of care at discharge, Engage patient in therapeutic group addressing interpersonal concerns.  Short Term Goals: Engage patient in aftercare planning with referrals and resources, Increase social support, Increase ability to appropriately verbalize feelings, Increase emotional regulation, Facilitate acceptance of mental health diagnosis and concerns, Facilitate patient progression through stages of change regarding substance use diagnoses and concerns, Identify triggers associated with mental health/substance abuse issues, and Increase skills for wellness and recovery  Therapeutic Interventions: Assess for all discharge needs, 1 to 1 time with Social worker, Explore available resources and support systems, Assess for adequacy in community support network, Educate family and significant other(s) on suicide prevention, Complete Psychosocial Assessment, Interpersonal group therapy.  Evaluation of Outcomes: Progressing   Progress in Treatment: Attending groups: No. Participating in groups: No. Taking medication as prescribed: Yes. Toleration medication: Yes. Family/Significant other contact made: Yes, individual(s) contacted:  [name] Brain Patient understands diagnosis: Yes. and No. Discussing patient identified problems/goals with staff: No. Medical problems stabilized or resolved: Yes. Denies suicidal/homicidal ideation: No. and As evidenced by:  pt  not speaking  Issues/concerns per patient self-inventory: No. Other: None    New problem(s) identified: No, Describe:  None identified  Update 03/31/24: No changes at this time    New Short Term/Long Term Goal(s): detox, elimination of symptoms of psychosis, medication management for mood stabilization; elimination of SI thoughts; development of comprehensive mental wellness/sobriety  plan. Update 03/31/24: No changes at this time    Patient Goals:  Pt unable to identify goals Update 03/31/24: No changes at this time    Discharge Plan or Barriers: CSW will assist with appropriate discharge planning Update 03/31/24: No changes at this time    Reason for Continuation of Hospitalization: Medication stabilization Update 03/31/24: No changes at this time    Estimated Length of Stay: 1 to 7 days Update 03/31/24: TBD   Last 3 [place] Suicide Severity Risk Score: Flowsheet Row Admission (Current) from 03/25/2024 in [facility] BEHAVIORAL MEDICINE Most recent reading at 03/26/2024 12:00 AM ED from 03/25/2024 in [facility] Emergency Department at [facility] Most recent reading at 03/25/2024  4:15 PM ED from 03/15/2024 in [facility] Emergency Department at [facility] Most recent reading at 03/15/2024  1:27 PM  C-SSRS RISK CATEGORY No Risk No Risk No Risk    Last PHQ 2/9 Scores:    11/24/2023    1:43 PM 07/14/2023    1:16 PM 10/29/2022    9:49 AM  Depression screen PHQ 2/9  Decreased Interest 1 0 1  Down, Depressed, Hopeless 1 0 1  PHQ - 2 Score 2 0 2  Altered sleeping 2 0 1  Tired, decreased energy 1 0 2  Change in appetite 0 0 0  Feeling bad or failure about yourself  0 0 0  Trouble concentrating 0 0 1  Moving slowly or fidgety/restless 0 0 0  Suicidal thoughts 0 0 0  PHQ-9 Score 5  0  6   Difficult doing work/chores Not difficult at all Not difficult at all Not difficult at all     Data saved with a previous flowsheet row definition    Scribe for Treatment Team: Lum JONETTA Croft, [facility] 03/31/2024 4:09 PM

## 2024-04-01 DIAGNOSIS — F333 Major depressive disorder, recurrent, severe with psychotic symptoms: Secondary | ICD-10-CM | POA: Diagnosis not present

## 2024-04-01 MED ORDER — RISPERIDONE 1 MG PO TABS
0.5000 mg | ORAL_TABLET | Freq: Every morning | ORAL | Status: DC
  Administered 2024-04-02 – 2024-04-04 (×3): 0.5 mg via ORAL
  Filled 2024-04-01 (×3): qty 1

## 2024-04-01 MED ORDER — RISPERIDONE 1 MG PO TABS
3.0000 mg | ORAL_TABLET | Freq: Every day | ORAL | Status: DC
  Administered 2024-04-02 – 2024-04-09 (×8): 3 mg via ORAL
  Filled 2024-04-01 (×8): qty 3

## 2024-04-01 NOTE — Group Note (Signed)
 Date:  04/01/2024 Time:  10:23 AM  Group Topic/Focus:  Orientation:   The focus of this group is to educate the patient on the purpose and policies of crisis stabilization and provide a format to answer questions about their admission.  The group details unit policies and expectations of patients while admitted.  Exercise for geriatric patients offers vast benefits, significantly improving physical function, mental health, and independence by reducing fall risk, managing chronic diseases (heart disease, diabetes, cancer), enhancing cognitive function, boosting mood, improving sleep, strengthening bones, and supporting longer, healthier lives. Regular activity helps combat age-related decline, preventing frailty and enabling older adults to live more independently and with better overall quality of life.   Participation Level:  Did Not Attend   [name] 04/01/2024, 10:23 AM

## 2024-04-01 NOTE — Group Note (Signed)
 Recreation Therapy Group Note   Group Topic:General Recreation  Group Date: 04/01/2024 Start Time: 1400 End Time: 1450 Facilitators: Celestia Jeoffrey BRAVO, LRT, CTRS Location: Dayroom  Group Description: Bingo. Patients played multiple rounds of bingo. LRT and pts discussed the definition of leisure, things they do in their free time outside of the hospital, and how bingo is also a leisure activity. Pts received a coloring book, word search book, or journal as a prize.    Goal Area(s) Addressed:  Patient will identify a current leisure interest.  Patient will learn the definition of leisure. Patient will have the opportunity to try a new leisure activity. Patient will communicate with peers and LRT.   Affect/Mood: N/A   Participation Level: Did not attend    Clinical Observations/Individualized Feedback: Patient did not attend.  Plan: Continue to engage patient in RT group sessions 2-3x/week.   Jeoffrey BRAVO Celestia, LRT, CTRS 04/01/2024 4:42 PM

## 2024-04-01 NOTE — Plan of Care (Signed)
  Problem: Education: Goal: Mental status will improve Outcome: Not Progressing   Problem: Activity: Goal: Interest or engagement in activities will improve Outcome: Not Progressing Goal: Sleeping patterns will improve Outcome: Not Progressing   

## 2024-04-01 NOTE — Plan of Care (Signed)
   Problem: Education: Goal: Utilization of techniques to improve thought processes will improve Outcome: Progressing Goal: Knowledge of the prescribed therapeutic regimen will improve Outcome: Progressing

## 2024-04-01 NOTE — Progress Notes (Signed)
" °   04/01/24 1200  Psych Admission Type (Psych Patients Only)  Admission Status Voluntary  Psychosocial Assessment  Patient Complaints Isolation  Eye Contact Poor;Suspiciousness  Facial Expression Anxious  Affect Labile  Speech Slow  Interaction Isolative  Motor Activity Slow;Restless  Appearance/Hygiene In scrubs  Behavior Characteristics Irritable  Mood Irritable  Thought Process  Coherency Disorganized  Content Paranoia  Delusions None reported or observed  Perception Hallucinations  Hallucination Auditory  Judgment Poor  Confusion Moderate  Danger to Self  Current suicidal ideation? Denies  Danger to Others  Danger to Others None reported or observed    "

## 2024-04-01 NOTE — Progress Notes (Signed)
" °   03/31/24 2300  Psych Admission Type (Psych Patients Only)  Admission Status Voluntary  Psychosocial Assessment  Patient Complaints Isolation  Eye Contact Poor;Suspiciousness  Facial Expression Flat  Affect Labile  Speech Slow  Interaction Isolative  Motor Activity Slow;Restless  Appearance/Hygiene In scrubs;Poor hygiene;Layered clothes;Disheveled  Behavior Characteristics Irritable  Mood Irritable  Thought Process  Coherency Disorganized  Content Paranoia  Delusions None reported or observed  Perception Hallucinations  Hallucination Auditory  Judgment Poor  Confusion Moderate  Danger to Self  Current suicidal ideation? Denies  Danger to Others  Danger to Others None reported or observed    "

## 2024-04-01 NOTE — Progress Notes (Signed)
 [facility] MD Progress Note  04/01/2024 1:42 PM Paddy Walthall  MRN:  [id number]  daughter: [name] [phone] provider and daughter discussed the treatment plan extensively and updated on medications Subjective:  Chart reviewed, case discussed in multidisciplinary meeting, patient seen during rounds.    Patient is noted to be sitting in her room.  She remains in her room most of the day and when nurses encouraged her to stay in the day area she returns back to her room and sleeps.  She denies feeling depressed or anxious.  She reports that her daughter has been calling her regularly.  Since patient is noted to be drowsy in the morning we will cut down the morning dose of Risperdal .  She denies auditory/visual hallucinations. Past Psychiatric History: see h&P Family History: History reviewed. No pertinent family history. Social History:  Social History   Substance and Sexual Activity  Alcohol Use Yes   Comment: 3-4 40 oz beers per week - occasionally     Social History   Substance and Sexual Activity  Drug Use No    Social History   Socioeconomic History   Marital status: Widowed    Spouse name: Not on file   Number of children: Not on file   Years of education: Not on file   Highest education level: High school graduate  Occupational History   Not on file  Tobacco Use   Smoking status: Every Day    Current packs/day: 0.33    Types: Cigarettes   Smokeless tobacco: Never  Vaping Use   Vaping status: Never Used  Substance and Sexual Activity   Alcohol use: Yes    Comment: 3-4 40 oz beers per week - occasionally   Drug use: No   Sexual activity: Not Currently  Other Topics Concern   Not on file  Social History Narrative   Not on file   Social Drivers of Health   Tobacco Use: High Risk (03/25/2024)   Patient History    Smoking Tobacco Use: Every Day    Smokeless Tobacco Use: Never    Passive Exposure: Not on file  Financial Resource Strain: Not on file  Food  Insecurity: No Food Insecurity (03/26/2024)   Epic    Worried About Programme Researcher, Broadcasting/film/video in the Last Year: Never true    Ran Out of Food in the Last Year: Never true  Transportation Needs: No Transportation Needs (03/26/2024)   Epic    Lack of Transportation (Medical): No    Lack of Transportation (Non-Medical): No  Physical Activity: Not on file  Stress: Not on file  Social Connections: Not on file  Depression (PHQ2-9): Medium Risk (11/24/2023)   Depression (PHQ2-9)    PHQ-2 Score: 5  Alcohol Screen: Low Risk (03/29/2024)   Alcohol Screen    Last Alcohol Screening Score (AUDIT): 0  Housing: Low Risk (03/26/2024)   Epic    Unable to Pay for Housing in the Last Year: No    Number of Times Moved in the Last Year: 0    Homeless in the Last Year: No  Utilities: Not At Risk (03/26/2024)   Epic    Threatened with loss of utilities: No  Health Literacy: Not on file   Past Medical History:  Past Medical History:  Diagnosis Date   Depression    Hypertension     Past Surgical History:  Procedure Laterality Date   BRAIN SURGERY     BREAST BIOPSY Left 2011   CORE W/CLIP  EYE SURGERY Bilateral    glaucoma   TUBAL LIGATION      Current Medications: Current Facility-Administered Medications  Medication Dose Route Frequency Provider Last Rate Last Admin   acetaminophen  (TYLENOL ) tablet 650 mg  650 mg Oral Q4H PRN [name], [name], MD   650 mg at 03/31/24 2138   albuterol  (PROVENTIL ) (2.5 MG/3ML) 0.083% nebulizer solution 3 mL  3 mL Inhalation Q4H PRN [name], [name], MD       alum & mag hydroxide-simeth (MAALOX/MYLANTA) 200-200-20 MG/5ML suspension 30 mL  30 mL Oral Q4H PRN [name], [name], MD   30 mL at 03/28/24 1848   amLODipine  (NORVASC ) tablet 10 mg  10 mg Oral Daily [name], [name], MD   10 mg at 04/01/24 1012   FLUoxetine  (PROZAC ) capsule 40 mg  40 mg Oral q morning Deangela Randleman, MD   40 mg at 04/01/24 1012   folic acid  (FOLVITE ) tablet 1 mg  1 mg Oral Daily Jlynn Ly,  MD   1 mg at 04/01/24 1013   hydrochlorothiazide  (HYDRODIURIL ) tablet 25 mg  25 mg Oral Daily Brantley Naser, MD   25 mg at 04/01/24 1012   magnesium  hydroxide (MILK OF MAGNESIA) suspension 30 mL  30 mL Oral Daily PRN [name], [name], MD       nicotine  (NICODERM CQ  - dosed in mg/24 hours) patch 14 mg  14 mg Transdermal Daily Sashia Campas, MD   14 mg at 03/31/24 1040   OLANZapine  (ZYPREXA ) tablet 5 mg  5 mg Oral Q8H PRN Laquitta Dominski, MD   5 mg at 03/29/24 1318   Or   OLANZapine  (ZYPREXA ) injection 5 mg  5 mg Intramuscular Q8H PRN Yelena Metzer, MD       ondansetron  (ZOFRAN -ODT) disintegrating tablet 4 mg  4 mg Oral Q8H PRN Addie Alonge, MD       pantoprazole  (PROTONIX ) EC tablet 40 mg  40 mg Oral Daily [name], [name], MD   40 mg at 04/01/24 1013   risperiDONE  (RISPERDAL ) tablet 2 mg  2 mg Oral BID Etta Gassett, MD   2 mg at 04/01/24 1012   thiamine  (VITAMIN B1) tablet 100 mg  100 mg Oral Daily Cailey Trigueros, MD   100 mg at 04/01/24 1013    Lab Results:  No results found for this or any previous visit (from the past 48 hours).   Blood Alcohol level:  Lab Results  Component Value Date   [facility] <15 03/25/2024   ETH <10 03/03/2020    Metabolic Disorder Labs: Lab Results  Component Value Date   HGBA1C 5.4 11/24/2023   MPG 108 11/24/2023   No results found for: PROLACTIN Lab Results  Component Value Date   CHOL 242 (H) 11/24/2023   TRIG 97 11/24/2023   HDL 69 11/24/2023   CHOLHDL 3.5 11/24/2023   LDLCALC 152 (H) 11/24/2023    Physical Findings: AIMS:  , ,  ,  ,    CIWA:    COWS:      Psychiatric Specialty Exam:  Presentation  General Appearance:  Appropriate for Environment  Eye Contact: Fair  Speech: Clear and Coherent  Speech Volume: Normal    Mood and Affect  Mood: Labile; Irritable  Affect: Labile   Thought Process  Thought Processes: Disorganized  Orientation:Partial  Thought Content:Illogical; Paranoid Ideation;  Delusions  Hallucinations: Auditory hallucinations patient responding to internal stimuli  Ideas of Reference:Delusions  Suicidal Thoughts: Denies  Homicidal Thoughts: Denies   Sensorium  Memory:  Remote Poor; Immediate Poor  Judgment: Impaired  Insight: Shallow   Executive Functions  Concentration: Poor  Attention Span: Poor  Recall: -- (unable to assess)  Fund of Knowledge: -- (unable to assess)  Language: -- (unable to assess)   Psychomotor Activity  Psychomotor Activity: No data recorded  Musculoskeletal: Strength & Muscle Tone: within normal limits Gait & Station: normal Assets  Assets: Social Support    Physical Exam: Physical Exam ROS Blood pressure (!) 138/94, pulse 77, temperature 98.4 F (36.9 C), resp. rate 18, height 5' 7.01 (1.702 m), weight 60.1 kg, SpO2 100%. Body mass index is 20.75 kg/m.  Diagnosis: Active Problems:   MDD (major depressive disorder), recurrent, severe, with psychosis (HCC) R/O   Treatment Plan Summary:  Safety and Monitoring:             -- Voluntary admission to inpatient psychiatric unit for safety, stabilization and treatment             -- Daily contact with patient to assess and evaluate symptoms and progress in treatment             -- Patient's case to be discussed in multi-disciplinary team meeting             -- Observation Level: q15 minute checks             -- Vital signs:  q12 hours             -- Precautions: suicide, elopement, and assault   2. Psychiatric Diagnoses and Treatment:              Prozac  40 mg daily  risperdal  reduce to 2mg  at bedtime and 0.5 mg qam   -- The risks/benefits/side-effects/alternatives to this medication were discussed in detail with the patient and time was given for questions. The patient consents to medication trial.                -- Metabolic profile and EKG monitoring obtained while on an atypical antipsychotic (BMI: Lipid Panel: HbgA1c: QTc:)               -- Encouraged patient to participate in unit milieu and in scheduled group therapies                            3. Medical Issues Being Addressed:  4. Discharge Planning:   -- Social work and case management to assist with discharge planning and identification of hospital follow-up needs prior to discharge  -- Estimated LOS: 3-4 days  Allyn Foil, MD 04/01/2024, 1:42 PM

## 2024-04-02 DIAGNOSIS — F333 Major depressive disorder, recurrent, severe with psychotic symptoms: Secondary | ICD-10-CM | POA: Diagnosis not present

## 2024-04-02 NOTE — Group Note (Signed)
 Recreation Therapy Group Note   Group Topic:Emotion Expression  Group Date: 04/02/2024 Start Time: 1400 End Time: 1445 Facilitators: Celestia Jeoffrey BRAVO, LRT, CTRS Location: Dayroom  Group Description: Painting a Peaceful Place. Patients and LRT discuss what it means to be at peace, what it feels like physically and mentally. Pts are given a canvas and watercolor paint to use and encouraged to draw their idea of a peaceful place. Pts and LRT discuss how they use this in their daily life post discharge. Pts are encouraged to take their canvas home with them as a reminder to find their peaceful place whenever they are feeling depressed, anxious, etc.    Goal Area(s) Addressed:  Patient will identify what it means to experience a peaceful emotion. Patient will identify a new coping skill.  Patient will express their emotions through art. Patients will increase communication by talking with LRT and peers while in group.   Affect/Mood: Appropriate and Flat   Participation Level: Moderate   Participation Quality: Independent   Behavior: Calm and Cooperative   Speech/Thought Process: Loose association   Insight: Limited   Judgement: Fair    Modes of Intervention: Art   Patient Response to Interventions:  Receptive   Education Outcome:  In group clarification offered    Clinical Observations/Individualized Feedback: [name] was mostly active in their participation of session activities and group discussion. [name]. Pt appropriately painted a canvas.    Plan: Continue to engage patient in RT group sessions 2-3x/week.   Jeoffrey BRAVO Celestia, LRT, CTRS 04/02/2024 4:34 PM

## 2024-04-02 NOTE — Progress Notes (Signed)
" °   04/02/24 2200  Psych Admission Type (Psych Patients Only)  Admission Status Voluntary  Psychosocial Assessment  Patient Complaints None  Eye Contact Brief  Facial Expression Anxious  Affect Appropriate to circumstance;Preoccupied  Speech Logical/coherent  Interaction Guarded  Motor Activity Slow  Appearance/Hygiene In scrubs  Behavior Characteristics Appropriate to situation;Cooperative;Calm  Mood Preoccupied;Pleasant  Thought Process  Coherency Disorganized  Content Paranoia  Delusions None reported or observed  Perception Hallucinations  Hallucination Auditory  Judgment Impaired  Confusion Mild  Danger to Self  Current suicidal ideation? Denies  Danger to Others  Danger to Others None reported or observed    "

## 2024-04-02 NOTE — BHH Counselor (Signed)
 CSW spoke with the patient's daughter [phone], [name].  She reports that she is coming to visit the patient during visitation.  CSW reported that she will follow up with weekend staff on how patient is doing and thoughts on a Monday discharge.    Family reports that the patient's pharmacy is [facility] on [address].  Can return to home with no problems if doing better.   Sherryle Margo, MSW, LCSW 04/02/2024 3:48 PM

## 2024-04-02 NOTE — Group Note (Signed)
 Date:  04/02/2024 Time:  8:44 PM  Group Topic/Focus:  Wrap-Up Group:   The focus of this group is to help patients review their daily goal of treatment and discuss progress on daily workbooks.    Participation Level:  Active  Participation Quality:  Appropriate  Affect:  Appropriate  Cognitive:  Appropriate  Insight: Appropriate  Engagement in Group:  Engaged  Modes of Intervention:  Discussion  Additional Comments:    [name] 04/02/2024, 8:44 PM

## 2024-04-02 NOTE — Plan of Care (Signed)
  Problem: Self-Concept: Goal: Level of anxiety will decrease Outcome: Progressing   Problem: Education: Goal: Knowledge of the prescribed therapeutic regimen will improve Outcome: Progressing   

## 2024-04-02 NOTE — Progress Notes (Signed)
" °   04/01/24 2335  Psych Admission Type (Psych Patients Only)  Admission Status Voluntary  Psychosocial Assessment  Patient Complaints Isolation;Irritability  Eye Contact Poor;Suspiciousness  Facial Expression Anxious  Affect Labile  Speech Slow  Interaction Isolative  Motor Activity Slow;Restless  Appearance/Hygiene In scrubs  Behavior Characteristics Irritable  Mood Irritable  Thought Process  Coherency Disorganized  Content Paranoia  Delusions None reported or observed  Perception Hallucinations  Hallucination Auditory  Judgment Poor  Confusion Moderate  Danger to Self  Current suicidal ideation? Denies  Danger to Others  Danger to Others None reported or observed    Estimated Sleeping Duration (Last 24 Hours): 5.00-6.75 hours  "

## 2024-04-02 NOTE — Progress Notes (Signed)
" °   04/02/24 1400  Psych Admission Type (Psych Patients Only)  Admission Status Voluntary  Psychosocial Assessment  Patient Complaints Irritability  Eye Contact Poor;Suspiciousness  Facial Expression Anxious  Affect Labile  Speech Slow  Interaction Isolative  Motor Activity Slow;Fidgety  Appearance/Hygiene In scrubs  Behavior Characteristics Irritable  Mood Suspicious  Thought Process  Coherency Disorganized  Content Paranoia  Delusions None reported or observed  Perception Hallucinations  Hallucination Auditory  Judgment Impaired  Confusion Mild  Danger to Self  Current suicidal ideation? Denies (denies)  Danger to Others  Danger to Others None reported or observed    "

## 2024-04-02 NOTE — Group Note (Signed)
 Physical/Occupational Therapy Group Note  Group Topic: Pain Management and Coping   Group Date: 04/02/2024 Start Time: 1300 End Time: 1345 Facilitators: Clive Warren CROME, OT   Group Description: Group discussed impact of chronic/acute pain on safety and independence with functional tasks and impact on mental health.  Identified and discussed any previously learned or implemented strategies used.  Discussed and reviewed cognitive behavioral pain coping strategies to address/improve overall management of pain. Discussed relaxation, distraction techniques, cognitive restructuring, activity pacing/energy conservation, environment/home safety modifications, and role of sleep and sleep hygiene. Allowed time for questions and further discussion.  Therapeutic Goal(s):  Identify and discuss previously utilized pain coping strategies and implications of pain on function/well-being Identify and discuss implementing new cognitive behavioral pain coping strategies into daily routines Demonstrate understanding and performance of learned cognitive behavioral pain coping strategies  Individual Participation: Pt did not attend.  Participation Level: Did not attend   Participation Quality:   Behavior:   Speech/Thought Process:   Affect/Mood:   Insight:   Judgement:   Modes of Intervention:   Patient Response to Interventions:    Plan: Continue to engage patient in PT/OT groups 1 - 2x/week.  Valiant Dills R., MPH, MS, OTR/L ascom [phone] 04/02/24, 4:01 PM

## 2024-04-02 NOTE — Group Note (Deleted)
 Date:  04/02/2024 Time:  8:41 PM  Group Topic/Focus:  Wrap-Up Group:   The focus of this group is to help patients review their daily goal of treatment and discuss progress on daily workbooks.     Participation Level:  {BHH PARTICIPATION OZCZO:77735}  Participation Quality:  {BHH PARTICIPATION QUALITY:22265}  Affect:  {BHH AFFECT:22266}  Cognitive:  {BHH COGNITIVE:22267}  Insight: {BHH Insight2:20797}  Engagement in Group:  {BHH ENGAGEMENT IN HMNLE:77731}  Modes of Intervention:  {BHH MODES OF INTERVENTION:22269}  Additional Comments:  ***  Nolyn Eilert T Deidrea Gaetz 04/02/2024, 8:41 PM

## 2024-04-02 NOTE — Plan of Care (Signed)
 " Problem: Education: Goal: Ability to state activities that reduce stress will improve 04/02/2024 2052 by Geneva Steward CROME, RN Outcome: Progressing 04/02/2024 2052 by Geneva Steward CROME, RN Outcome: Progressing   Problem: Coping: Goal: Ability to identify and develop effective coping behavior will improve 04/02/2024 2052 by Geneva Steward CROME, RN Outcome: Progressing 04/02/2024 2052 by Geneva Steward CROME, RN Outcome: Progressing   Problem: Self-Concept: Goal: Ability to identify factors that promote anxiety will improve 04/02/2024 2052 by Geneva Steward CROME, RN Outcome: Progressing 04/02/2024 2052 by Geneva Steward CROME, RN Outcome: Progressing Goal: Level of anxiety will decrease 04/02/2024 2052 by Geneva Steward CROME, RN Outcome: Progressing 04/02/2024 2052 by Geneva Steward CROME, RN Outcome: Progressing Goal: Ability to modify response to factors that promote anxiety will improve 04/02/2024 2052 by Geneva Steward CROME, RN Outcome: Progressing 04/02/2024 2052 by Geneva Steward CROME, RN Outcome: Progressing   Problem: Education: Goal: Utilization of techniques to improve thought processes will improve 04/02/2024 2052 by Geneva Steward CROME, RN Outcome: Progressing 04/02/2024 2052 by Geneva Steward CROME, RN Outcome: Progressing Goal: Knowledge of the prescribed therapeutic regimen will improve 04/02/2024 2052 by Geneva Steward CROME, RN Outcome: Progressing 04/02/2024 2052 by Geneva Steward CROME, RN Outcome: Progressing   Problem: Activity: Goal: Interest or engagement in leisure activities will improve 04/02/2024 2052 by Geneva Steward CROME, RN Outcome: Progressing 04/02/2024 2052 by Geneva Steward CROME, RN Outcome: Progressing Goal: Imbalance in normal sleep/wake cycle will improve 04/02/2024 2052 by Geneva Steward CROME, RN Outcome: Progressing 04/02/2024 2052 by Geneva Steward CROME, RN Outcome: Progressing   Problem: Coping: Goal: Coping ability will improve 04/02/2024 2052 by Geneva Steward CROME, RN Outcome: Progressing 04/02/2024 2052 by  Geneva Steward CROME, RN Outcome: Progressing Goal: Will verbalize feelings 04/02/2024 2052 by Geneva Steward CROME, RN Outcome: Progressing 04/02/2024 2052 by Geneva Steward CROME, RN Outcome: Progressing   Problem: Health Behavior/Discharge Planning: Goal: Ability to make decisions will improve 04/02/2024 2052 by Geneva Steward CROME, RN Outcome: Progressing 04/02/2024 2052 by Geneva Steward CROME, RN Outcome: Progressing Goal: Compliance with therapeutic regimen will improve 04/02/2024 2052 by Geneva Steward CROME, RN Outcome: Progressing 04/02/2024 2052 by Geneva Steward CROME, RN Outcome: Progressing   Problem: Role Relationship: Goal: Will demonstrate positive changes in social behaviors and relationships 04/02/2024 2052 by Geneva Steward CROME, RN Outcome: Progressing 04/02/2024 2052 by Geneva Steward CROME, RN Outcome: Progressing   Problem: Safety: Goal: Ability to disclose and discuss suicidal ideas will improve 04/02/2024 2052 by Geneva Steward CROME, RN Outcome: Progressing 04/02/2024 2052 by Geneva Steward CROME, RN Outcome: Progressing Goal: Ability to identify and utilize support systems that promote safety will improve 04/02/2024 2052 by Geneva Steward CROME, RN Outcome: Progressing 04/02/2024 2052 by Geneva Steward CROME, RN Outcome: Progressing   Problem: Self-Concept: Goal: Will verbalize positive feelings about self 04/02/2024 2052 by Geneva Steward CROME, RN Outcome: Progressing 04/02/2024 2052 by Geneva Steward CROME, RN Outcome: Progressing Goal: Level of anxiety will decrease 04/02/2024 2052 by Geneva Steward CROME, RN Outcome: Progressing 04/02/2024 2052 by Geneva Steward CROME, RN Outcome: Progressing   Problem: Education: Goal: Knowledge of [place] [facility] Education information/materials will improve 04/02/2024 2052 by Geneva Steward CROME, RN Outcome: Progressing 04/02/2024 2052 by Geneva Steward CROME, RN Outcome: Progressing Goal: Emotional status will improve 04/02/2024 2052 by Geneva Steward CROME, RN Outcome: Progressing 04/02/2024 2052  by Geneva Steward CROME, RN Outcome: Progressing Goal: Mental status will improve 04/02/2024 2052 by Geneva Steward CROME, RN Outcome: Progressing 04/02/2024 2052 by Geneva Steward CROME, RN Outcome: Progressing  Goal: Verbalization of understanding the information provided will improve 04/02/2024 2052 by Geneva Steward CROME, RN Outcome: Progressing 04/02/2024 2052 by Geneva Steward CROME, RN Outcome: Progressing   Problem: Activity: Goal: Interest or engagement in activities will improve 04/02/2024 2052 by Geneva Steward CROME, RN Outcome: Progressing 04/02/2024 2052 by Geneva Steward CROME, RN Outcome: Progressing Goal: Sleeping patterns will improve 04/02/2024 2052 by Geneva Steward CROME, RN Outcome: Progressing 04/02/2024 2052 by Geneva Steward CROME, RN Outcome: Progressing   Problem: Coping: Goal: Ability to verbalize frustrations and anger appropriately will improve 04/02/2024 2052 by Geneva Steward CROME, RN Outcome: Progressing 04/02/2024 2052 by Geneva Steward CROME, RN Outcome: Progressing Goal: Ability to demonstrate self-control will improve 04/02/2024 2052 by Geneva Steward CROME, RN Outcome: Progressing 04/02/2024 2052 by Geneva Steward CROME, RN Outcome: Progressing   Problem: Health Behavior/Discharge Planning: Goal: Identification of resources available to assist in meeting health care needs will improve Outcome: Progressing Goal: Compliance with treatment plan for underlying cause of condition will improve Outcome: Progressing   Problem: Physical Regulation: Goal: Ability to maintain clinical measurements within normal limits will improve Outcome: Progressing   Problem: Safety: Goal: Periods of time without injury will increase Outcome: Progressing   Problem: Education: Goal: Knowledge of General Education information will improve Description: Including pain rating scale, medication(s)/side effects and non-pharmacologic comfort measures Outcome: Progressing   Problem: Health Behavior/Discharge Planning: Goal:  Ability to manage health-related needs will improve Outcome: Progressing   Problem: Clinical Measurements: Goal: Ability to maintain clinical measurements within normal limits will improve Outcome: Progressing Goal: Will remain free from infection Outcome: Progressing Goal: Diagnostic test results will improve Outcome: Progressing Goal: Respiratory complications will improve Outcome: Progressing Goal: Cardiovascular complication will be avoided Outcome: Progressing   Problem: Activity: Goal: Risk for activity intolerance will decrease Outcome: Progressing   Problem: Nutrition: Goal: Adequate nutrition will be maintained Outcome: Progressing   Problem: Coping: Goal: Level of anxiety will decrease Outcome: Progressing   Problem: Elimination: Goal: Will not experience complications related to bowel motility Outcome: Progressing Goal: Will not experience complications related to urinary retention Outcome: Progressing   Problem: Pain Managment: Goal: General experience of comfort will improve and/or be controlled Outcome: Progressing   Problem: Safety: Goal: Ability to remain free from injury will improve Outcome: Progressing   Problem: Skin Integrity: Goal: Risk for impaired skin integrity will decrease Outcome: Progressing   Problem: Education: Goal: Will be free of psychotic symptoms Outcome: Progressing   "

## 2024-04-02 NOTE — Plan of Care (Signed)
   Problem: Education: Goal: Ability to state activities that reduce stress will improve Outcome: Progressing   Problem: Coping: Goal: Ability to identify and develop effective coping behavior will improve Outcome: Progressing

## 2024-04-02 NOTE — Group Note (Signed)
 Date:  04/02/2024 Time:  10:03 AM  Group Topic/Focus:   Goals Group:   The focus of this group is to help patients establish daily goals to achieve during treatment and discuss how the patient can incorporate goal setting into their daily lives to aide in recovery.    Participation Level:  Did Not Attend  Maglione,Blossie Raffel E 04/02/2024, 10:03 AM

## 2024-04-02 NOTE — Progress Notes (Signed)
 [facility] MD Progress Note  04/02/2024 10:22 PM [name]  MRN:  [id number]  daughter: [name] [phone] provider and daughter discussed the treatment plan extensively and updated on medications Subjective:  Chart reviewed, case discussed in multidisciplinary meeting, patient seen during rounds.    Patient is noted to be in her room.  She offers no complaints.  She reports her daughter visiting her regularly.  Per nursing staff patient is more clear today and is noted to be coming out of her room.  Provider has cut down the morning dose of Risperdal  to 0.5 mg and increase the nighttime dose to 3 mg to minimize daytime sedation.  Social work team reached out to the daughter who informed no concerns in the current progress of the patient.  Patient denies SI/HI/plan and denies hallucinations. Past Psychiatric History: see h&P Family History: History reviewed. No pertinent family history. Social History:  Social History   Substance and Sexual Activity  Alcohol Use Yes   Comment: 3-4 40 oz beers per week - occasionally     Social History   Substance and Sexual Activity  Drug Use No    Social History   Socioeconomic History   Marital status: Widowed    Spouse name: Not on file   Number of children: Not on file   Years of education: Not on file   Highest education level: High school graduate  Occupational History   Not on file  Tobacco Use   Smoking status: Every Day    Current packs/day: 0.33    Types: Cigarettes   Smokeless tobacco: Never  Vaping Use   Vaping status: Never Used  Substance and Sexual Activity   Alcohol use: Yes    Comment: 3-4 40 oz beers per week - occasionally   Drug use: No   Sexual activity: Not Currently  Other Topics Concern   Not on file  Social History Narrative   Not on file   Social Drivers of Health   Tobacco Use: High Risk (03/25/2024)   Patient History    Smoking Tobacco Use: Every Day    Smokeless Tobacco Use: Never    Passive  Exposure: Not on file  Financial Resource Strain: Not on file  Food Insecurity: No Food Insecurity (03/26/2024)   Epic    Worried About Programme Researcher, Broadcasting/film/video in the Last Year: Never true    Ran Out of Food in the Last Year: Never true  Transportation Needs: No Transportation Needs (03/26/2024)   Epic    Lack of Transportation (Medical): No    Lack of Transportation (Non-Medical): No  Physical Activity: Not on file  Stress: Not on file  Social Connections: Not on file  Depression (PHQ2-9): Medium Risk (11/24/2023)   Depression (PHQ2-9)    PHQ-2 Score: 5  Alcohol Screen: Low Risk (03/29/2024)   Alcohol Screen    Last Alcohol Screening Score (AUDIT): 0  Housing: Low Risk (03/26/2024)   Epic    Unable to Pay for Housing in the Last Year: No    Number of Times Moved in the Last Year: 0    Homeless in the Last Year: No  Utilities: Not At Risk (03/26/2024)   Epic    Threatened with loss of utilities: No  Health Literacy: Not on file   Past Medical History:  Past Medical History:  Diagnosis Date   Depression    Hypertension     Past Surgical History:  Procedure Laterality Date   BRAIN SURGERY  BREAST BIOPSY Left 2011   CORE W/CLIP   EYE SURGERY Bilateral    glaucoma   TUBAL LIGATION      Current Medications: Current Facility-Administered Medications  Medication Dose Route Frequency Provider Last Rate Last Admin   acetaminophen  (TYLENOL ) tablet 650 mg  650 mg Oral Q4H PRN [name], [name], MD   650 mg at 04/02/24 1731   albuterol  (PROVENTIL ) (2.5 MG/3ML) 0.083% nebulizer solution 3 mL  3 mL Inhalation Q4H PRN [name], [name], MD       alum & mag hydroxide-simeth (MAALOX/MYLANTA) 200-200-20 MG/5ML suspension 30 mL  30 mL Oral Q4H PRN [name], [name], MD   30 mL at 03/28/24 1848   amLODipine  (NORVASC ) tablet 10 mg  10 mg Oral Daily [name], [name], MD   10 mg at 04/02/24 1004   FLUoxetine  (PROZAC ) capsule 40 mg  40 mg Oral q morning Fritz Cauthon, MD   40 mg at 04/02/24 1004    folic acid  (FOLVITE ) tablet 1 mg  1 mg Oral Daily Leshon Armistead, MD   1 mg at 04/02/24 1004   hydrochlorothiazide  (HYDRODIURIL ) tablet 25 mg  25 mg Oral Daily Demetrick Eichenberger, MD   25 mg at 04/02/24 1004   magnesium  hydroxide (MILK OF MAGNESIA) suspension 30 mL  30 mL Oral Daily PRN [name], [name], MD       nicotine  (NICODERM CQ  - dosed in mg/24 hours) patch 14 mg  14 mg Transdermal Daily Kaylani Fromme, MD   14 mg at 04/02/24 1234   OLANZapine  (ZYPREXA ) tablet 5 mg  5 mg Oral Q8H PRN Yehudah Standing, MD   5 mg at 04/01/24 2258   Or   OLANZapine  (ZYPREXA ) injection 5 mg  5 mg Intramuscular Q8H PRN Keegen Heffern, MD       ondansetron  (ZOFRAN -ODT) disintegrating tablet 4 mg  4 mg Oral Q8H PRN Syanna Remmert, MD       pantoprazole  (PROTONIX ) EC tablet 40 mg  40 mg Oral Daily [name], [name], MD   40 mg at 04/02/24 1004   risperiDONE  (RISPERDAL ) tablet 0.5 mg  0.5 mg Oral q AM Mckell Riecke, MD   0.5 mg at 04/02/24 0631   risperiDONE  (RISPERDAL ) tablet 3 mg  3 mg Oral QHS Shey Yott, MD   3 mg at 04/02/24 2114   thiamine  (VITAMIN B1) tablet 100 mg  100 mg Oral Daily Kida Digiulio, MD   100 mg at 04/02/24 1004    Lab Results:  No results found for this or any previous visit (from the past 48 hours).   Blood Alcohol level:  Lab Results  Component Value Date   [facility] <15 03/25/2024   ETH <10 03/03/2020    Metabolic Disorder Labs: Lab Results  Component Value Date   HGBA1C 5.4 11/24/2023   MPG 108 11/24/2023   No results found for: PROLACTIN Lab Results  Component Value Date   CHOL 242 (H) 11/24/2023   TRIG 97 11/24/2023   HDL 69 11/24/2023   CHOLHDL 3.5 11/24/2023   LDLCALC 152 (H) 11/24/2023    Physical Findings: AIMS:  , ,  ,  ,    CIWA:    COWS:      Psychiatric Specialty Exam:  Presentation  General Appearance:  Appropriate for Environment  Eye Contact: Fair  Speech: Clear and Coherent  Speech Volume: Normal    Mood and Affect   Mood: Labile; Irritable  Affect: Labile   Thought Process  Thought Processes: Disorganized  Orientation:Partial  Thought Content:Illogical; Paranoid Ideation; Delusions  Hallucinations: Auditory hallucinations patient responding to internal stimuli  Ideas of Reference:Delusions  Suicidal Thoughts: Denies  Homicidal Thoughts: Denies   Sensorium  Memory: Remote Poor; Immediate Poor  Judgment: Impaired  Insight: Shallow   Executive Functions  Concentration: Poor  Attention Span: Poor  Recall: -- (unable to assess)  Fund of Knowledge: -- (unable to assess)  Language: -- (unable to assess)   Psychomotor Activity  Psychomotor Activity: No data recorded  Musculoskeletal: Strength & Muscle Tone: within normal limits Gait & Station: normal Assets  Assets: Social Support    Physical Exam: Physical Exam ROS Blood pressure 127/84, pulse 78, temperature 97.9 F (36.6 C), resp. rate 18, height 5' 7.01 (1.702 m), weight 60.1 kg, SpO2 100%. Body mass index is 20.75 kg/m.  Diagnosis: Active Problems:   MDD (major depressive disorder), recurrent, severe, with psychosis (HCC) R/O   Treatment Plan Summary:  Safety and Monitoring:             -- Voluntary admission to inpatient psychiatric unit for safety, stabilization and treatment             -- Daily contact with patient to assess and evaluate symptoms and progress in treatment             -- Patient's case to be discussed in multi-disciplinary team meeting             -- Observation Level: q15 minute checks             -- Vital signs:  q12 hours             -- Precautions: suicide, elopement, and assault   2. Psychiatric Diagnoses and Treatment:              Prozac  40 mg daily  risperdal  reduce to 2mg  at bedtime and 0.5 mg qam   -- The risks/benefits/side-effects/alternatives to this medication were discussed in detail with the patient and time was given for questions. The patient consents  to medication trial.                -- Metabolic profile and EKG monitoring obtained while on an atypical antipsychotic (BMI: Lipid Panel: HbgA1c: QTc:)              -- Encouraged patient to participate in unit milieu and in scheduled group therapies                            3. Medical Issues Being Addressed:  4. Discharge Planning:   -- Social work and case management to assist with discharge planning and identification of hospital follow-up needs prior to discharge  -- Estimated LOS: 3-4 days  Allyn Foil, MD 04/02/2024, 10:22 PM

## 2024-04-03 DIAGNOSIS — F333 Major depressive disorder, recurrent, severe with psychotic symptoms: Secondary | ICD-10-CM | POA: Diagnosis not present

## 2024-04-03 MED ORDER — NICOTINE POLACRILEX 2 MG MT GUM
2.0000 mg | CHEWING_GUM | Freq: Four times a day (QID) | OROMUCOSAL | Status: DC | PRN
  Administered 2024-04-03 – 2024-04-08 (×7): 2 mg via ORAL
  Filled 2024-04-03 (×8): qty 1

## 2024-04-03 NOTE — Group Note (Signed)
 Date:  04/03/2024 Time:  4:55 PM  Group Topic/Focus:  Wellness Toolbox:   The focus of this group is to discuss various aspects of wellness, balancing those aspects and exploring ways to increase the ability to experience wellness.  Patients will create a wellness toolbox for use upon discharge.    Participation Level:  Did Not Attend   Larrie Leita BRAVO 04/03/2024, 4:55 PM

## 2024-04-03 NOTE — Group Note (Signed)
 Date:  04/03/2024 Time:  10:07 PM  Group Topic/Focus:  Self Care:   The focus of this group is to help patients understand the importance of self-care in order to improve or restore emotional, physical, spiritual, interpersonal, and financial health.    Participation Level:  Active  Participation Quality:  Appropriate  Affect:  Appropriate  Cognitive:  Appropriate  Insight: Good  Engagement in Group:  Engaged  Modes of Intervention:  Discussion  Additional Comments:    [name] 04/03/2024, 10:07 PM

## 2024-04-03 NOTE — Group Note (Signed)
 [facility] LCSW Group Therapy Note   Group Date: 04/03/2024 Start Time: 1405 End Time: 1435   Type of Therapy/Topic:  Group Therapy:  Balance in Life  Participation Level:  Minimal   Description of Group:    This group will address the concept of balance and how it feels and looks when one is unbalanced. Patients will be encouraged to process areas in their lives that are out of balance, and identify reasons for remaining unbalanced. Facilitators will guide patients utilizing problem- solving interventions to address and correct the stressor making their life unbalanced. Understanding and applying boundaries will be explored and addressed for obtaining  and maintaining a balanced life. Patients will be encouraged to explore ways to assertively make their unbalanced needs known to significant others in their lives, using other group members and facilitator for support and feedback.  Therapeutic Goals: Patient will identify two or more emotions or situations they have that consume much of in their lives. Patient will identify signs/triggers that life has become out of balance:  Patient will identify two ways to set boundaries in order to achieve balance in their lives:  Patient will demonstrate ability to communicate their needs through discussion and/or role plays  Summary of Patient Progress: The facilitator and patients discussed 7 steps to finding balance in life. Group members chose 3 affirmation stickers that they focused on to bring positivity to every situation or challenge.  The patient reflected on their own personal thoughts and feelings about themselves and explained why they chose that particular affirmation.  The patient was receptive to feedback from both peers and the facilitator and contributed to creating a supportive environment, encouraging others to open up and share.        Therapeutic Modalities:   Cognitive Behavioral Therapy Solution-Focused Therapy Assertiveness  Training   Rexene LELON Mae, LCSWA

## 2024-04-03 NOTE — Plan of Care (Signed)
  Problem: Self-Concept: Goal: Level of anxiety will decrease Outcome: Progressing   Problem: Self-Concept: Goal: Will verbalize positive feelings about self Outcome: Not Progressing

## 2024-04-03 NOTE — Progress Notes (Signed)
 [id number] [facility] MD Progress Note  04/03/2024 1:50 PM [name]  MRN:  [id number]  daughter: [name] [phone] provider and daughter discussed the treatment plan extensively and updated on medications Subjective:  Chart reviewed, case discussed in multidisciplinary meeting, patient seen during rounds.    Patient is noted to be in her room.  She offers no complaints.  She reports her daughter visiting her regularly.  Per nursing staff patient is more clear today and is noted to be coming out of her room.  Provider has cut down the morning dose of Risperdal  to 0.5 mg and increase the nighttime dose to 3 mg to minimize daytime sedation.  Social work team reached out to the daughter who informed no concerns in the current progress of the patient.  Patient denies SI/HI/plan and denies hallucinations. Past Psychiatric History: see h&P Family History: History reviewed. No pertinent family history. Social History:  Social History   Substance and Sexual Activity  Alcohol Use Yes   Comment: 3-4 40 oz beers per week - occasionally     Social History   Substance and Sexual Activity  Drug Use No    Social History   Socioeconomic History   Marital status: Widowed    Spouse name: Not on file   Number of children: Not on file   Years of education: Not on file   Highest education level: High school graduate  Occupational History   Not on file  Tobacco Use   Smoking status: Every Day    Current packs/day: 0.33    Types: Cigarettes   Smokeless tobacco: Never  Vaping Use   Vaping status: Never Used  Substance and Sexual Activity   Alcohol use: Yes    Comment: 3-4 40 oz beers per week - occasionally   Drug use: No   Sexual activity: Not Currently  Other Topics Concern   Not on file  Social History Narrative   Not on file   Social Drivers of Health   Tobacco Use: High Risk (03/25/2024)   Patient History    Smoking Tobacco Use: Every Day    Smokeless Tobacco Use: Never    Passive  Exposure: Not on file  Financial Resource Strain: Not on file  Food Insecurity: No Food Insecurity (03/26/2024)   Epic    Worried About Programme Researcher, Broadcasting/film/video in the Last Year: Never true    Ran Out of Food in the Last Year: Never true  Transportation Needs: No Transportation Needs (03/26/2024)   Epic    Lack of Transportation (Medical): No    Lack of Transportation (Non-Medical): No  Physical Activity: Not on file  Stress: Not on file  Social Connections: Not on file  Depression (PHQ2-9): Medium Risk (11/24/2023)   Depression (PHQ2-9)    PHQ-2 Score: 5  Alcohol Screen: Low Risk (03/29/2024)   Alcohol Screen    Last Alcohol Screening Score (AUDIT): 0  Housing: Low Risk (03/26/2024)   Epic    Unable to Pay for Housing in the Last Year: No    Number of Times Moved in the Last Year: 0    Homeless in the Last Year: No  Utilities: Not At Risk (03/26/2024)   Epic    Threatened with loss of utilities: No  Health Literacy: Not on file   Past Medical History:  Past Medical History:  Diagnosis Date   Depression    Hypertension     Past Surgical History:  Procedure Laterality Date   BRAIN SURGERY  BREAST BIOPSY Left 2011   CORE W/CLIP   EYE SURGERY Bilateral    glaucoma   TUBAL LIGATION      Current Medications: Current Facility-Administered Medications  Medication Dose Route Frequency Provider Last Rate Last Admin   acetaminophen  (TYLENOL ) tablet 650 mg  650 mg Oral Q4H PRN Hart Haas R, MD   650 mg at 04/03/24 0022   albuterol  (PROVENTIL ) (2.5 MG/3ML) 0.083% nebulizer solution 3 mL  3 mL Inhalation Q4H PRN Shahrukh Pasch R, MD       alum & mag hydroxide-simeth (MAALOX/MYLANTA) 200-200-20 MG/5ML suspension 30 mL  30 mL Oral Q4H PRN Hussien Greenblatt R, MD   30 mL at 03/28/24 1848   amLODipine  (NORVASC ) tablet 10 mg  10 mg Oral Daily Chidi Shirer R, MD   10 mg at 04/03/24 1015   FLUoxetine  (PROZAC ) capsule 40 mg  40 mg Oral q morning [name], [name], MD   40 mg at 04/03/24 1015    folic acid  (FOLVITE ) tablet 1 mg  1 mg Oral Daily [name], [name], MD   1 mg at 04/03/24 1015   hydrochlorothiazide  (HYDRODIURIL ) tablet 25 mg  25 mg Oral Daily [name], [name], MD   25 mg at 04/03/24 1015   magnesium  hydroxide (MILK OF MAGNESIA) suspension 30 mL  30 mL Oral Daily PRN Dashan Chizmar R, MD       nicotine  (NICODERM CQ  - dosed in mg/24 hours) patch 14 mg  14 mg Transdermal Daily [name], [name], MD   14 mg at 04/02/24 1234   nicotine  polacrilex (NICORETTE ) gum 2 mg  2 mg Oral QID PRN Breonna Gafford R, MD       OLANZapine  (ZYPREXA ) tablet 5 mg  5 mg Oral Q8H PRN [name], [name], MD   5 mg at 04/01/24 2258   Or   OLANZapine  (ZYPREXA ) injection 5 mg  5 mg Intramuscular Q8H PRN [name], [name], MD       ondansetron  (ZOFRAN -ODT) disintegrating tablet 4 mg  4 mg Oral Q8H PRN [name], [name], MD       pantoprazole  (PROTONIX ) EC tablet 40 mg  40 mg Oral Daily Zhaire Locker R, MD   40 mg at 04/03/24 1015   risperiDONE  (RISPERDAL ) tablet 0.5 mg  0.5 mg Oral q AM [name], [name], MD   0.5 mg at 04/03/24 9367   risperiDONE  (RISPERDAL ) tablet 3 mg  3 mg Oral QHS [name], [name], MD   3 mg at 04/02/24 2114   thiamine  (VITAMIN B1) tablet 100 mg  100 mg Oral Daily [name], [name], MD   100 mg at 04/03/24 1015    Lab Results:  No results found for this or any previous visit (from the past 48 hours).   Blood Alcohol level:  Lab Results  Component Value Date   [facility] <15 03/25/2024   ETH <10 03/03/2020    Metabolic Disorder Labs: Lab Results  Component Value Date   HGBA1C 5.4 11/24/2023   MPG 108 11/24/2023   No results found for: PROLACTIN Lab Results  Component Value Date   CHOL 242 (H) 11/24/2023   TRIG 97 11/24/2023   HDL 69 11/24/2023   CHOLHDL 3.5 11/24/2023   LDLCALC 152 (H) 11/24/2023    Physical Findings: AIMS:  , ,  ,  ,    CIWA:    COWS:      Psychiatric Specialty Exam:  Presentation  General Appearance:  Appropriate for Environment  Eye  Contact: Fair  Speech: Clear and Coherent  Speech Volume: Normal  Mood and Affect  Mood: Labile; Irritable  Affect: Labile   Thought Process  Thought Processes: Disorganized  Orientation:Partial  Thought Content:Illogical; Paranoid Ideation; Delusions  Hallucinations: Auditory hallucinations patient responding to internal stimuli  Ideas of Reference:Delusions  Suicidal Thoughts: Denies  Homicidal Thoughts: Denies   Sensorium  Memory: Remote Poor; Immediate Poor  Judgment: Impaired  Insight: Shallow   Executive Functions  Concentration: Poor  Attention Span: Poor  Recall: -- (unable to assess)  Fund of Knowledge: -- (unable to assess)  Language: -- (unable to assess)   Psychomotor Activity  Psychomotor Activity: No data recorded  Musculoskeletal: Strength & Muscle Tone: within normal limits Gait & Station: normal Assets  Assets: Social Support    Physical Exam: Physical Exam ROS Blood pressure 115/82, pulse 74, temperature 98.3 F (36.8 C), resp. rate 18, height 5' 7.01 (1.702 m), weight 60.1 kg, SpO2 100%. Body mass index is 20.75 kg/m.  Diagnosis: Active Problems:   MDD (major depressive disorder), recurrent, severe, with psychosis (HCC) R/O   Treatment Plan Summary:  Safety and Monitoring:             -- Voluntary admission to inpatient psychiatric unit for safety, stabilization and treatment             -- Daily contact with patient to assess and evaluate symptoms and progress in treatment             -- Patient's case to be discussed in multi-disciplinary team meeting             -- Observation Level: q15 minute checks             -- Vital signs:  q12 hours             -- Precautions: suicide, elopement, and assault   2. Psychiatric Diagnoses and Treatment:              Prozac  40 mg daily  risperdal  reduce to 2mg  at bedtime and 0.5 mg qam   -- The risks/benefits/side-effects/alternatives to this medication  were discussed in detail with the patient and time was given for questions. The patient consents to medication trial.                -- Metabolic profile and EKG monitoring obtained while on an atypical antipsychotic (BMI: Lipid Panel: HbgA1c: QTc:)              -- Encouraged patient to participate in unit milieu and in scheduled group therapies                            3. Medical Issues Being Addressed:  4. Discharge Planning:   -- Social work and case management to assist with discharge planning and identification of hospital follow-up needs prior to discharge  -- Estimated LOS: 3-4 days  Millie JONELLE Manners, MD 04/03/2024, 1:50 PM

## 2024-04-03 NOTE — Progress Notes (Addendum)
 SI/HI/AVH: denies all  Behavior/Mood: appropriate/suspicious    Interaction/Group attendance: isolative/ 1 of 3 groups   Medication/PRNs: compliant/nic gum x1   Pain: denies  Other: patient appeared to have discomfort from nic pitch applied yesterday but would not elaborate on the concern. Did not apply today but nic gum ordered PRN.  Discharge possibly Monday, Jan 19th.

## 2024-04-03 NOTE — Group Note (Signed)
 Date:  04/03/2024 Time:  9:43 AM  Group Topic/Focus:  Coping With Mental Health Crisis:   The purpose of this group is to help patients identify strategies for coping with mental health crisis.  Group discusses possible causes of crisis and ways to manage them effectively.    Participation Level:  Did Not Attend    Norleen SHAUNNA Bias 04/03/2024, 9:43 AM

## 2024-04-03 NOTE — Plan of Care (Signed)
 " Problem: Education: Goal: Ability to state activities that reduce stress will improve Outcome: Progressing   Problem: Coping: Goal: Ability to identify and develop effective coping behavior will improve Outcome: Progressing   Problem: Self-Concept: Goal: Ability to identify factors that promote anxiety will improve Outcome: Progressing Goal: Level of anxiety will decrease Outcome: Progressing Goal: Ability to modify response to factors that promote anxiety will improve Outcome: Progressing   Problem: Education: Goal: Utilization of techniques to improve thought processes will improve Outcome: Progressing Goal: Knowledge of the prescribed therapeutic regimen will improve Outcome: Progressing   Problem: Activity: Goal: Interest or engagement in leisure activities will improve Outcome: Progressing Goal: Imbalance in normal sleep/wake cycle will improve Outcome: Progressing   Problem: Coping: Goal: Coping ability will improve Outcome: Progressing Goal: Will verbalize feelings Outcome: Progressing   Problem: Health Behavior/Discharge Planning: Goal: Ability to make decisions will improve Outcome: Progressing Goal: Compliance with therapeutic regimen will improve Outcome: Progressing   Problem: Role Relationship: Goal: Will demonstrate positive changes in social behaviors and relationships Outcome: Progressing   Problem: Safety: Goal: Ability to disclose and discuss suicidal ideas will improve Outcome: Progressing Goal: Ability to identify and utilize support systems that promote safety will improve Outcome: Progressing   Problem: Self-Concept: Goal: Will verbalize positive feelings about self Outcome: Progressing Goal: Level of anxiety will decrease Outcome: Progressing   Problem: Education: Goal: Knowledge of [facility] Education information/materials will improve Outcome: Progressing Goal: Emotional status will improve Outcome: Progressing Goal:  Mental status will improve Outcome: Progressing Goal: Verbalization of understanding the information provided will improve Outcome: Progressing   Problem: Activity: Goal: Interest or engagement in activities will improve Outcome: Progressing Goal: Sleeping patterns will improve Outcome: Progressing   Problem: Coping: Goal: Ability to verbalize frustrations and anger appropriately will improve Outcome: Progressing Goal: Ability to demonstrate self-control will improve Outcome: Progressing   Problem: Health Behavior/Discharge Planning: Goal: Identification of resources available to assist in meeting health care needs will improve Outcome: Progressing Goal: Compliance with treatment plan for underlying cause of condition will improve Outcome: Progressing   Problem: Physical Regulation: Goal: Ability to maintain clinical measurements within normal limits will improve Outcome: Progressing   Problem: Safety: Goal: Periods of time without injury will increase Outcome: Progressing   Problem: Education: Goal: Knowledge of General Education information will improve Description: Including pain rating scale, medication(s)/side effects and non-pharmacologic comfort measures Outcome: Progressing   Problem: Health Behavior/Discharge Planning: Goal: Ability to manage health-related needs will improve Outcome: Progressing   Problem: Clinical Measurements: Goal: Ability to maintain clinical measurements within normal limits will improve Outcome: Progressing Goal: Will remain free from infection Outcome: Progressing Goal: Diagnostic test results will improve Outcome: Progressing Goal: Respiratory complications will improve Outcome: Progressing Goal: Cardiovascular complication will be avoided Outcome: Progressing   Problem: Activity: Goal: Risk for activity intolerance will decrease Outcome: Progressing   Problem: Nutrition: Goal: Adequate nutrition will be maintained Outcome:  Progressing   Problem: Coping: Goal: Level of anxiety will decrease Outcome: Progressing   Problem: Elimination: Goal: Will not experience complications related to bowel motility Outcome: Progressing Goal: Will not experience complications related to urinary retention Outcome: Progressing   Problem: Pain Managment: Goal: General experience of comfort will improve and/or be controlled Outcome: Progressing   Problem: Safety: Goal: Ability to remain free from injury will improve Outcome: Progressing   Problem: Skin Integrity: Goal: Risk for impaired skin integrity will decrease Outcome: Progressing   Problem: Education: Goal: Will be free of psychotic symptoms  Outcome: Progressing   "

## 2024-04-03 NOTE — B[facility] Counselor (Signed)
 CSW spoke with pt's daughter Leilany Digeronimo [phone] concerning pt's discharge possibly on Monday, January 19th.  Pt's daughter want to make sure that pt medication has been called in to [facility] on [address] in [place], [place].  Pt's daughter will transport patient home.  Pt's daughter is able to purchase medication for pt.

## 2024-04-03 NOTE — Progress Notes (Signed)
" °   04/03/24 2022  Psych Admission Type (Psych Patients Only)  Admission Status Voluntary  Psychosocial Assessment  Patient Complaints Anxiety;Irritability;Worrying  Eye Contact Brief  Facial Expression Anxious;Worried  Affect Preoccupied;Irritable  Academic Librarian;Assertive  Motor Activity Slow  Appearance/Hygiene In scrubs  Behavior Characteristics Irritable;Pacing;Restless;Anxious  Mood Anxious;Preoccupied;Irritable  Thought Process  Coherency Disorganized  Content Paranoia  Delusions None reported or observed  Perception WDL  Hallucination None reported or observed  Judgment Impaired  Confusion Mild  Danger to Self  Current suicidal ideation? Denies  Danger to Others  Danger to Others None reported or observed   Mood/Behavior:  Pt pacing the unit with complaints of bilateral lower extremity pain. Pt encourage to sit and elevate lower extremities but, pt visualized walking around the unit and in and out her room. Pt complaining about swelling in able. Pt reporting that she has been urinating frequently. Pt given prn Tylenol  for pain. Pt elevated LE overnight. Provider made aware of events. Pt appears restless, irritable and anxious. Pt making comments that she will call 911 to go to the ED. Pt then states I will be going to the ED tomorrow. Provided pt reassurance and discussed POC. Pt redirectable. Pt then apologitic to writer stating I am not trying to talk to you like that.   Psych assessment: Denies SI/HI and AVH.     Interaction / Group attendance:  Present in the milieu. Minimal interaction with peers. Pt up to the nurses station several times with different request. Pt assisted per request. Attended group.   Medication/ PRNs: Compliant with scheduled medications. Required PRNs Tylenol  for leg pain and noted effective.   Pain: BLE pain  15 min checks in place for safety. "

## 2024-04-04 DIAGNOSIS — F333 Major depressive disorder, recurrent, severe with psychotic symptoms: Secondary | ICD-10-CM | POA: Diagnosis not present

## 2024-04-04 MED ORDER — RISPERIDONE 1 MG PO TABS
1.0000 mg | ORAL_TABLET | Freq: Every morning | ORAL | Status: DC
  Administered 2024-04-05 – 2024-04-10 (×6): 1 mg via ORAL
  Filled 2024-04-04 (×6): qty 1

## 2024-04-04 NOTE — Progress Notes (Signed)
 Patient ID: [name], female   DOB: 29-Jun-1960, 63 y.o.   MRN: [id number]  1415 Pt PRN med effective. Pt is less aggressive and agitated. Monitoring continues during 7a-7p. Pt C/O of foot pain. Noted L foot swollen. Staff reported prior Nurse requested a consult. PRN pain meds offered. Pt refused.

## 2024-04-04 NOTE — Plan of Care (Signed)
" °  Problem: Education: Goal: Ability to state activities that reduce stress will improve 04/04/2024 1919 by Claudene Bronwyn DASEN, RN Outcome: Progressing 04/04/2024 1919 by Claudene Bronwyn DASEN, RN Outcome: Progressing 04/04/2024 1918 by Claudene Bronwyn DASEN, RN Outcome: Progressing 04/04/2024 1917 by Claudene Bronwyn DASEN, RN Outcome: Progressing 04/04/2024 1906 by Claudene Bronwyn DASEN, RN Outcome: Progressing   Problem: Coping: Goal: Ability to identify and develop effective coping behavior will improve 04/04/2024 1919 by Claudene Bronwyn DASEN, RN Outcome: Progressing 04/04/2024 1919 by Claudene Bronwyn DASEN, RN Outcome: Progressing 04/04/2024 1918 by Claudene Bronwyn DASEN, RN Outcome: Progressing 04/04/2024 1917 by Claudene Bronwyn DASEN, RN Outcome: Progressing 04/04/2024 1906 by Claudene Bronwyn DASEN, RN Outcome: Progressing   "

## 2024-04-04 NOTE — Progress Notes (Signed)
 [facility] MD Progress Note  04/04/2024 1:13 PM [name]  MRN:  [id number]  daughter: [name] [phone] provider and daughter discussed the treatment plan extensively and updated on medications Subjective:  Chart reviewed, case discussed in multidisciplinary meeting, patient seen during rounds.    Past Psychiatric History: see h&P Family History: History reviewed. No pertinent family history. Social History:  The patient is a 64 year old female admitted for significant disorganized and delusional thought processes with marked paranoia. She continues to exhibit impaired reality testing and limited engagement, with poor oral intake, raising concerns regarding self-care deficits.  She denies current suicidal or homicidal ideation. The patient has expressed ambivalence regarding medications, stating she wants food but not medications at times.  Continued inpatient psychiatric treatment remains medically necessary due to ongoing psychosis, paranoia, impaired judgment, and inability to meet basic needs independently.    Social History   Substance and Sexual Activity  Drug Use No    Social History   Socioeconomic History   Marital status: Widowed    Spouse name: Not on file   Number of children: Not on file   Years of education: Not on file   Highest education level: High school graduate  Occupational History   Not on file  Tobacco Use   Smoking status: Every Day    Current packs/day: 0.33    Types: Cigarettes   Smokeless tobacco: Never  Vaping Use   Vaping status: Never Used  Substance and Sexual Activity   Alcohol use: Yes    Comment: 3-4 40 oz beers per week - occasionally   Drug use: No   Sexual activity: Not Currently  Other Topics Concern   Not on file  Social History Narrative   Not on file   Social Drivers of Health   Tobacco Use: High Risk (03/25/2024)   Patient History    Smoking Tobacco Use: Every Day    Smokeless Tobacco Use: Never    Passive  Exposure: Not on file  Financial Resource Strain: Not on file  Food Insecurity: No Food Insecurity (03/26/2024)   Epic    Worried About Programme Researcher, Broadcasting/film/video in the Last Year: Never true    Ran Out of Food in the Last Year: Never true  Transportation Needs: No Transportation Needs (03/26/2024)   Epic    Lack of Transportation (Medical): No    Lack of Transportation (Non-Medical): No  Physical Activity: Not on file  Stress: Not on file  Social Connections: Not on file  Depression (PHQ2-9): Medium Risk (11/24/2023)   Depression (PHQ2-9)    PHQ-2 Score: 5  Alcohol Screen: Low Risk (03/29/2024)   Alcohol Screen    Last Alcohol Screening Score (AUDIT): 0  Housing: Low Risk (03/26/2024)   Epic    Unable to Pay for Housing in the Last Year: No    Number of Times Moved in the Last Year: 0    Homeless in the Last Year: No  Utilities: Not At Risk (03/26/2024)   Epic    Threatened with loss of utilities: No  Health Literacy: Not on file   Past Medical History:  Past Medical History:  Diagnosis Date   Depression    Hypertension     Past Surgical History:  Procedure Laterality Date   BRAIN SURGERY     BREAST BIOPSY Left 2011   CORE W/CLIP   EYE SURGERY Bilateral    glaucoma   TUBAL LIGATION      Current Medications: Current Facility-Administered Medications  Medication  Dose Route Frequency Provider Last Rate Last Admin   acetaminophen  (TYLENOL ) tablet 650 mg  650 mg Oral Q4H PRN Kensley Valladares R, MD   650 mg at 04/03/24 2015   albuterol  (PROVENTIL ) (2.5 MG/3ML) 0.083% nebulizer solution 3 mL  3 mL Inhalation Q4H PRN Hendryx Ricke R, MD       alum & mag hydroxide-simeth (MAALOX/MYLANTA) 200-200-20 MG/5ML suspension 30 mL  30 mL Oral Q4H PRN Kathlean Cinco R, MD   30 mL at 03/28/24 1848   amLODipine  (NORVASC ) tablet 10 mg  10 mg Oral Daily Jaran Sainz R, MD   10 mg at 04/04/24 1052   FLUoxetine  (PROZAC ) capsule 40 mg  40 mg Oral q morning [name], [name], MD   40 mg at 04/04/24 1053    folic acid  (FOLVITE ) tablet 1 mg  1 mg Oral Daily [name], [name], MD   1 mg at 04/04/24 1054   hydrochlorothiazide  (HYDRODIURIL ) tablet 25 mg  25 mg Oral Daily [name], [name], MD   25 mg at 04/04/24 0957   magnesium  hydroxide (MILK OF MAGNESIA) suspension 30 mL  30 mL Oral Daily PRN Syd Newsome R, MD       nicotine  (NICODERM CQ  - dosed in mg/24 hours) patch 14 mg  14 mg Transdermal Daily [name], [name], MD   14 mg at 04/02/24 1234   nicotine  polacrilex (NICORETTE ) gum 2 mg  2 mg Oral QID PRN Jerric Oyen R, MD   2 mg at 04/03/24 1508   OLANZapine  (ZYPREXA ) tablet 5 mg  5 mg Oral Q8H PRN [name], [name], MD   5 mg at 04/04/24 1217   Or   OLANZapine  (ZYPREXA ) injection 5 mg  5 mg Intramuscular Q8H PRN [name], [name], MD       ondansetron  (ZOFRAN -ODT) disintegrating tablet 4 mg  4 mg Oral Q8H PRN [name], [name], MD       pantoprazole  (PROTONIX ) EC tablet 40 mg  40 mg Oral Daily Harish Bram R, MD   40 mg at 04/04/24 1055   risperiDONE  (RISPERDAL ) tablet 0.5 mg  0.5 mg Oral q AM [name], [name], MD   0.5 mg at 04/04/24 9361   risperiDONE  (RISPERDAL ) tablet 3 mg  3 mg Oral QHS [name], [name], MD   3 mg at 04/03/24 2107   thiamine  (VITAMIN B1) tablet 100 mg  100 mg Oral Daily [name], [name], MD   100 mg at 04/04/24 9043    Lab Results:  No results found for this or any previous visit (from the past 48 hours).   Blood Alcohol level:  Lab Results  Component Value Date   [facility] <15 03/25/2024   ETH <10 03/03/2020    Metabolic Disorder Labs: Lab Results  Component Value Date   HGBA1C 5.4 11/24/2023   MPG 108 11/24/2023   No results found for: PROLACTIN Lab Results  Component Value Date   CHOL 242 (H) 11/24/2023   TRIG 97 11/24/2023   HDL 69 11/24/2023   CHOLHDL 3.5 11/24/2023   LDLCALC 152 (H) 11/24/2023    Physical Findings: AIMS:  , ,  ,  ,    CIWA:    COWS:      Psychiatric Specialty Exam:  Presentation  General Appearance:  Appropriate for  Environment  Eye Contact: Fair  Speech: Clear and Coherent  Speech Volume: Normal    Mood and Affect  Mood: Labile; Irritable  Affect: Labile   Thought Process  Thought Processes: Disorganized  Orientation:Partial  Thought Content:Illogical; Paranoid Ideation; Delusions  Hallucinations: Auditory hallucinations patient responding to internal stimuli  Ideas of Reference:Delusions  Suicidal Thoughts: Denies  Homicidal Thoughts: Denies   Sensorium  Memory: Remote Poor; Immediate Poor  Judgment: Impaired  Insight: Shallow   Executive Functions  Concentration: Poor  Attention Span: Poor  Recall: -- (unable to assess)  Fund of Knowledge: -- (unable to assess)  Language: -- (unable to assess)   Psychomotor Activity  Psychomotor Activity: No data recorded  Musculoskeletal: Strength & Muscle Tone: within normal limits Gait & Station: normal Assets  Assets: Social Support    Physical Exam: Physical Exam ROS Blood pressure 109/67, pulse 84, temperature (!) 97.5 F (36.4 C), resp. rate 15, height 5' 7.01 (1.702 m), weight 60.1 kg, SpO2 97%. Body mass index is 20.75 kg/m.  Diagnosis: Active Problems:   MDD (major depressive disorder), recurrent, severe, with psychosis (HCC) R/O   Treatment Plan Summary:  Safety and Monitoring:             -- Voluntary admission to inpatient psychiatric unit for safety, stabilization and treatment             -- Daily contact with patient to assess and evaluate symptoms and progress in treatment             -- Patient's case to be discussed in multi-disciplinary team meeting             -- Observation Level: q15 minute checks             -- Vital signs:  q12 hours             -- Precautions: suicide, elopement, and assault   2. Psychiatric Diagnoses and Treatment:              Prozac  40 mg daily  risperdal  reduce to 2mg  at bedtime and 0.5 mg qam   -- The risks/benefits/side-effects/alternatives  to this medication were discussed in detail with the patient and time was given for questions. The patient consents to medication trial.                -- Metabolic profile and EKG monitoring obtained while on an atypical antipsychotic (BMI: Lipid Panel: HbgA1c: QTc:)              -- Encouraged patient to participate in unit milieu and in scheduled group therapies                            3. Medical Issues Being Addressed:  4. Discharge Planning:   -- Social work and case management to assist with discharge planning and identification of hospital follow-up needs prior to discharge  -- Estimated LOS: 3-4 days  Millie JONELLE Manners, MD 04/04/2024, 1:13 PM

## 2024-04-04 NOTE — Plan of Care (Signed)

## 2024-04-04 NOTE — Progress Notes (Addendum)
 Patient ID: [name], female   DOB: 11/09/1960, 64 y.o.   MRN: [id number]  1215 Pt A/O Pt refused to respond with orientated. Noted behavior. Increase agitation and curse words. Attempted to redirect Pt and she continues to be aggressive and agitated saying Call them on three way. This fucking place. I don't care! Zyprexa  5 mg 1 tab given. 1300 Not effective. 1330 PRN IM. Security came to assist with IM. Pt Tolerated well. 1500 Daughter was called and no voicemail set up to leave a message. 1530Daughter called and was advised of Pt receiving PRN IM.

## 2024-04-04 NOTE — Plan of Care (Signed)
" °  Problem: Education: Goal: Ability to state activities that reduce stress will improve 04/04/2024 1917 by Claudene Bronwyn DASEN, RN Outcome: Progressing 04/04/2024 1906 by Claudene Bronwyn DASEN, RN Outcome: Progressing   Problem: Coping: Goal: Ability to identify and develop effective coping behavior will improve 04/04/2024 1917 by Claudene Bronwyn DASEN, RN Outcome: Progressing 04/04/2024 1906 by Claudene Bronwyn DASEN, RN Outcome: Progressing   Problem: Self-Concept: Goal: Ability to identify factors that promote anxiety will improve 04/04/2024 1917 by Claudene Bronwyn DASEN, RN Outcome: Progressing 04/04/2024 1906 by Claudene Bronwyn DASEN, RN Outcome: Progressing Goal: Level of anxiety will decrease 04/04/2024 1917 by Claudene Bronwyn DASEN, RN Outcome: Progressing 04/04/2024 1906 by Claudene Bronwyn DASEN, RN Outcome: Progressing Goal: Ability to modify response to factors that promote anxiety will improve 04/04/2024 1917 by Claudene Bronwyn DASEN, RN Outcome: Progressing 04/04/2024 1906 by Claudene Bronwyn DASEN, RN Outcome: Progressing   "

## 2024-04-04 NOTE — Group Note (Signed)
 Date:  04/04/2024 Time:  10:36 AM  Group Topic/Focus:  Rediscovering Joy:   The focus of this group is to explore various ways to relieve stress in a positive manner.    Participation Level:  Did Not Attend  Larrie Leita BRAVO 04/04/2024, 10:36 AM

## 2024-04-04 NOTE — Group Note (Signed)
 Date:  04/04/2024 Time:  3:44 PM  Group Topic/Focus:  Self Care:   The focus of this group is to help patients understand the importance of self-care in order to improve or restore emotional, physical, spiritual, interpersonal, and financial health.    Participation Level:  Did Not Attend   Larrie Leita BRAVO 04/04/2024, 3:44 PM

## 2024-04-05 DIAGNOSIS — F333 Major depressive disorder, recurrent, severe with psychotic symptoms: Secondary | ICD-10-CM | POA: Diagnosis not present

## 2024-04-05 MED ORDER — DIVALPROEX SODIUM ER 500 MG PO TB24
750.0000 mg | ORAL_TABLET | Freq: Every day | ORAL | Status: DC
  Administered 2024-04-05 – 2024-04-08 (×4): 750 mg via ORAL
  Filled 2024-04-05 (×4): qty 1

## 2024-04-05 NOTE — Progress Notes (Signed)
 Patient is calm in her room stated she feel better this evening : I am sorry I was feeling bad today because it's suppose to be my Husband birthday but he died Pt is compliant with treatment. Denies SI/HI and verbally contract for safety.

## 2024-04-05 NOTE — Plan of Care (Signed)
  Problem: Coping: Goal: Ability to identify and develop effective coping behavior will improve Outcome: Progressing   Problem: Self-Concept: Goal: Ability to identify factors that promote anxiety will improve Outcome: Progressing   

## 2024-04-05 NOTE — Progress Notes (Deleted)
" °   04/05/24 2200  Psych Admission Type (Psych Patients Only)  Admission Status Voluntary  Psychosocial Assessment  Patient Complaints None  Eye Contact Fair  Facial Expression Animated  Affect Appropriate to circumstance  Speech Logical/coherent  Interaction Assertive  Motor Activity Slow  Behavior Characteristics Cooperative;Appropriate to situation  Mood Pleasant  Thought Process  Coherency Disorganized  Content Preoccupation  Delusions None reported or observed  Perception WDL  Hallucination None reported or observed  Judgment Impaired  Confusion Mild  Danger to Self  Current suicidal ideation? Denies  Danger to Others  Danger to Others None reported or observed    "

## 2024-04-05 NOTE — BH IP Treatment Plan (Signed)
 Interdisciplinary Treatment and Diagnostic Plan Update  04/05/2024 Time of Session: 3:00PM [name] Carreon MRN: [id number]  Principal Diagnosis: <principal problem not specified>  Secondary Diagnoses: Active Problems:   MDD (major depressive disorder), recurrent, severe, with psychosis (HCC)   Current Medications:  Current Facility-Administered Medications  Medication Dose Route Frequency Provider Last Rate Last Admin   acetaminophen  (TYLENOL ) tablet 650 mg  650 mg Oral Q4H PRN [name], [name], MD   650 mg at 04/03/24 2015   albuterol  (PROVENTIL ) (2.5 MG/3ML) 0.083% nebulizer solution 3 mL  3 mL Inhalation Q4H PRN [name], [name], MD       alum & mag hydroxide-simeth (MAALOX/MYLANTA) 200-200-20 MG/5ML suspension 30 mL  30 mL Oral Q4H PRN [name], [name], MD   30 mL at 03/28/24 1848   amLODipine  (NORVASC ) tablet 10 mg  10 mg Oral Daily [name], [name], MD   10 mg at 04/05/24 0900   divalproex  (DEPAKOTE  ER) 24 hr tablet 750 mg  750 mg Oral Daily [name], [name], MD   750 mg at 04/05/24 1111   FLUoxetine  (PROZAC ) capsule 40 mg  40 mg Oral q morning [name], [name], MD   40 mg at 04/05/24 0900   folic acid  (FOLVITE ) tablet 1 mg  1 mg Oral Daily [name], [name], MD   1 mg at 04/05/24 0900   hydrochlorothiazide  (HYDRODIURIL ) tablet 25 mg  25 mg Oral Daily [name], [name], MD   25 mg at 04/05/24 0900   magnesium  hydroxide (MILK OF MAGNESIA) suspension 30 mL  30 mL Oral Daily PRN [name], [name], MD       nicotine  (NICODERM CQ  - dosed in mg/24 hours) patch 14 mg  14 mg Transdermal Daily [name], [name], MD   14 mg at 04/05/24 9096   nicotine  polacrilex (NICORETTE ) gum 2 mg  2 mg Oral QID PRN [name], [name], MD   2 mg at 04/05/24 0858   OLANZapine  (ZYPREXA ) tablet 5 mg  5 mg Oral Q8H PRN [name], [name], MD   5 mg at 04/05/24 0700   Or   OLANZapine  (ZYPREXA ) injection 5 mg  5 mg Intramuscular Q8H PRN [name], [name], MD   5 mg at 04/04/24 1340   ondansetron  (ZOFRAN -ODT)  disintegrating tablet 4 mg  4 mg Oral Q8H PRN [name], [name], MD       pantoprazole  (PROTONIX ) EC tablet 40 mg  40 mg Oral Daily [name], [name], MD   40 mg at 04/05/24 0900   risperiDONE  (RISPERDAL ) tablet 1 mg  1 mg Oral q AM [name], [name], MD   1 mg at 04/05/24 0800   risperiDONE  (RISPERDAL ) tablet 3 mg  3 mg Oral QHS [name], [name], MD   3 mg at 04/04/24 2126   thiamine  (VITAMIN B1) tablet 100 mg  100 mg Oral Daily [name], [name], MD   100 mg at 04/05/24 0900   PTA Medications: Medications Prior to Admission  Medication Sig Dispense Refill Last Dose/Taking   amLODipine  (NORVASC ) 5 MG tablet Take 1 tablet (5 mg total) by mouth daily. 90 tablet 1    FLUoxetine  (PROZAC ) 40 MG capsule Take 1 capsule (40 mg total) by mouth once daily every morning. 90 capsule 1    hydrochlorothiazide  (HYDRODIURIL ) 25 MG tablet Take 1 tablet (25 mg total) by mouth daily. 90 tablet 1    meloxicam  (MOBIC ) 15 MG tablet Take 1 tablet (15 mg total) by mouth daily as needed for pain. 90 tablet 1    omeprazole  (PRILOSEC) 40  MG capsule Take 1 capsule (40 mg total) by mouth daily before breakfast. 90 capsule 1    ondansetron  (ZOFRAN -ODT) 4 MG disintegrating tablet Take 1 tablet (4 mg total) by mouth every 8 (eight) hours as needed for nausea or vomiting. 30 tablet 2    Sofosbuvir -Velpatasvir  (EPCLUSA ) 400-100 MG TABS Take 1 tablet by mouth daily. 28 tablet 2     Patient Stressors:    Patient Strengths:    Treatment Modalities: Medication Management, Group therapy, Case management,  1 to 1 session with clinician, Psychoeducation, Recreational therapy.   Physician Treatment Plan for Primary Diagnosis: <principal problem not specified> Long Term Goal(s): Improvement in symptoms so as ready for discharge   Short Term Goals: Unable to get a reliable answer from patient.  Ability to identify changes in lifestyle to reduce recurrence of condition will improve Ability to verbalize feelings will improve Ability  to disclose and discuss suicidal ideas Ability to demonstrate self-control will improve  Medication Management: Evaluate patient's response, side effects, and tolerance of medication regimen.  Therapeutic Interventions: 1 to 1 sessions, Unit Group sessions and Medication administration.  Evaluation of Outcomes: Not Progressing  Physician Treatment Plan for Secondary Diagnosis: Active Problems:   MDD (major depressive disorder), recurrent, severe, with psychosis (HCC)  Long Term Goal(s): Improvement in symptoms so as ready for discharge   Short Term Goals: Unable to get a reliable answer from patient.  Ability to identify changes in lifestyle to reduce recurrence of condition will improve Ability to verbalize feelings will improve Ability to disclose and discuss suicidal ideas Ability to demonstrate self-control will improve     Medication Management: Evaluate patient's response, side effects, and tolerance of medication regimen.  Therapeutic Interventions: 1 to 1 sessions, Unit Group sessions and Medication administration.  Evaluation of Outcomes: Not Progressing   RN Treatment Plan for Primary Diagnosis: <principal problem not specified> Long Term Goal(s): Knowledge of disease and therapeutic regimen to maintain health will improve  Short Term Goals: Ability to verbalize frustration and anger appropriately will improve, Ability to demonstrate self-control, Ability to participate in decision making will improve, Ability to verbalize feelings will improve, Ability to disclose and discuss suicidal ideas, Ability to identify and develop effective coping behaviors will improve, and Compliance with prescribed medications will improve  Medication Management: RN will administer medications as ordered by provider, will assess and evaluate patient's response and provide education to patient for prescribed medication. RN will report any adverse and/or side effects to prescribing  provider.  Therapeutic Interventions: 1 on 1 counseling sessions, Psychoeducation, Medication administration, Evaluate responses to treatment, Monitor vital signs and CBGs as ordered, Perform/monitor CIWA, COWS, AIMS and Fall Risk screenings as ordered, Perform wound care treatments as ordered.  Evaluation of Outcomes: Not Progressing   LCSW Treatment Plan for Primary Diagnosis: <principal problem not specified> Long Term Goal(s): Safe transition to appropriate next level of care at discharge, Engage patient in therapeutic group addressing interpersonal concerns.  Short Term Goals: Engage patient in aftercare planning with referrals and resources, Increase social support, Increase ability to appropriately verbalize feelings, Increase emotional regulation, Facilitate acceptance of mental health diagnosis and concerns, Facilitate patient progression through stages of change regarding substance use diagnoses and concerns, Identify triggers associated with mental health/substance abuse issues, and Increase skills for wellness and recovery  Therapeutic Interventions: Assess for all discharge needs, 1 to 1 time with Social worker, Explore available resources and support systems, Assess for adequacy in community support network, Educate family and significant other(s) on  suicide prevention, Complete Psychosocial Assessment, Interpersonal group therapy.  Evaluation of Outcomes: Not Progressing   Progress in Treatment: Attending groups: No. Participating in groups: No. Taking medication as prescribed: Yes. Toleration medication: Yes. Family/Significant other contact made: Yes, individual(s) contacted:  SPE completed with the patient's daughter Patient understands diagnosis: No. Discussing patient identified problems/goals with staff: No. Medical problems stabilized or resolved: Yes. Denies suicidal/homicidal ideation: Yes. Issues/concerns per patient self-inventory: No. Other: none  New  problem(s) identified: No, Describe:  None identified  Update 03/31/24: No changes at this time Update 04/05/2024: No changes at this time.    New Short Term/Long Term Goal(s): detox, elimination of symptoms of psychosis, medication management for mood stabilization; elimination of SI thoughts; development of comprehensive mental wellness/sobriety plan. Update 03/31/24: No changes at this time Update 04/05/2024: No changes at this time. Update 04/05/2024: No changes at this time.    Patient Goals:  Pt unable to identify goals Update 03/31/24: No changes at this time Update 04/05/2024: No changes at this time.    Discharge Plan or Barriers: CSW will assist with appropriate discharge planning Update 03/31/24: No changes at this time  Update 04/05/2024: plan is for patient to return to the home with her daughter.  Unclear at this time on recommendations for treatment.    Reason for Continuation of Hospitalization: Medication stabilization Update 03/31/24: No changes at this time    Estimated Length of Stay: 1 to 7 days Update 03/31/24: TBD Update 04/05/2024: TBD  Last 3 [place] Suicide Severity Risk Score: Flowsheet Row Admission (Current) from 03/25/2024 in [facility] BEHAVIORAL MEDICINE Most recent reading at 03/26/2024 12:00 AM ED from 03/25/2024 in [facility] Emergency Department at [facility] Most recent reading at 03/25/2024  4:15 PM ED from 03/15/2024 in [facility] Emergency Department at [facility] Most recent reading at 03/15/2024  1:27 PM  C-SSRS RISK CATEGORY No Risk No Risk No Risk    Last PHQ 2/9 Scores:    11/24/2023    1:43 PM 07/14/2023    1:16 PM 10/29/2022    9:49 AM  Depression screen PHQ 2/9  Decreased Interest 1 0 1  Down, Depressed, Hopeless 1 0 1  PHQ - 2 Score 2 0 2  Altered sleeping 2 0 1  Tired, decreased energy 1 0 2  Change in appetite 0 0 0  Feeling bad or failure about yourself  0 0 0  Trouble concentrating 0 0 1  Moving slowly or fidgety/restless 0 0  0  Suicidal thoughts 0 0 0  PHQ-9 Score 5  0  6   Difficult doing work/chores Not difficult at all Not difficult at all Not difficult at all     Data saved with a previous flowsheet row definition    Scribe for Treatment Team: Sherryle JINNY Margo, KEN 04/05/2024 3:58 PM

## 2024-04-05 NOTE — Group Note (Signed)
 Recreation Therapy Group Note   Group Topic:Coping Skills  Group Date: 04/05/2024 Start Time: 1400 End Time: 1435 Facilitators: Celestia Jeoffrey BRAVO, LRT, CTRS Location: Dayroom  Group Description: Meditation. LRT and patients discussed what they know about meditation and mindfulness. LRT played a Deep Breathing Meditation exercise script for patients to follow along to. LRT and patients discussed how meditation and deep breathing can be used as a coping skill post--discharge to help manage symptoms of stress.   Goal Area(s) Addressed: Patient will practice using relaxation technique. Patient will identify a new coping skill.  Patient will follow multistep directions to reduce anxiety and stress.   Affect/Mood: Flat   Participation Level: Minimal   Participation Quality: Minimal Cues   Behavior: Distracted   Speech/Thought Process: Disorganized   Insight: Limited   Judgement: Limited   Modes of Intervention: Clarification, Education, Exploration, and Music   Patient Response to Interventions:  Disengaged   Education Outcome:  In group clarification offered    Clinical Observations/Individualized Feedback: [name] was present in group. Pt did not complete any of the exercises as prompted. Pt was noted to ask LRT many times for blue socks while wearing 3 different pairs of socks layered already. LRT notified RN and MHT.   Plan: Continue to engage patient in RT group sessions 2-3x/week.   Jeoffrey BRAVO Celestia, LRT, CTRS 04/05/2024 4:59 PM

## 2024-04-05 NOTE — Progress Notes (Signed)
 Patient is very labile agitation calling Peers names slamming Get out of here you are a gay Patient required lots of redirection. She is slamming her door I will call my family they will fight all of you here Support and encouragement provided. Prn Zyprexa  5 mg PO given. Not yet effective.

## 2024-04-05 NOTE — Group Note (Signed)
 Physical/Occupational Therapy Group Note  Group Topic: Yoga  Group Date: 04/05/2024 Start Time: 1300 End Time: 1325 Facilitators: Sidrah Harden, Alm Hamilton, PT   Group Description: Group participated with series of yoga poses, designed to emphasize functional sitting balance, core stability, generalized flexibility and overall posture.  Incorporated deep breathing techniques with poses, working to promote relaxation, mindfulness and focus with targeted activities.  Discussed benefits of yoga in improving mood and self-esteem, reducing stress and anxiety, and promoting functional strength, balance and core stability for each participant.  Discussed ways to integrate into each participants daily routine.  Provided handout with written and pictorial descriptions of included yoga movements to be utilized as appropriate outside of group time.  Therapeutic Goal(s):  Demonstrate safe ability to participate with yoga poses during group activity. Identify one benefit of participation with yoga poses as part of each participants exercise/movement routine. Identify 1-2 individual poses that participant feels most beneficial to his/her needs and that he/she can easily replicate outside of group.  Individual Participation: Pt was pleasant and actively participated in both the discussion and activity portions of the session. Pt was able to modify activities with min cuing as needed for patient comfort.   Participation Level: Active and Engaged   Participation Quality: Minimal Cues   Behavior: Appropriate and Cooperative   Speech/Thought Process: Coherent   Affect/Mood: Appropriate   Insight: Good   Judgement: Good   Modes of Intervention: Activity, Discussion, and Education  Patient Response to Interventions:  Attentive, Engaged, and Interested    Plan: Continue to engage patient in PT/OT groups 1 - 2x/week.  CHARM Hamilton Bertin PT, DPT 04/05/24, 2:08 PM

## 2024-04-05 NOTE — Progress Notes (Signed)
 SPIRITUAL CARE AND COUNSELING CONSULT NOTE   VISIT SUMMARY Chaplain provided spiritual/emotional support while rounding on unit. Chaplain consulted with nurse team.   SPIRITUAL ENCOUNTER                                                                                                                                                                      Type of Visit: Initial Care provided to:: Patient Conversation partners present during encounter: Other (comment) (patients) Referral source: Patient request Reason for visit: Routine spiritual support OnCall Visit: No   SPIRITUAL FRAMEWORK  Presenting Themes: Goals in life/care, Rituals and practive Community/Connection: Family Patient Stress Factors: Health changes Family Stress Factors: None identified   GOALS   Self/Personal Goals: healing Clinical Care Goals: healing   INTERVENTIONS   Spiritual Care Interventions Made: Prayer, Compassionate presence    INTERVENTION OUTCOMES   Outcomes: Awareness of support, Awareness around self/spiritual resourses  Chaplain provided compassionate presences and pray upon request to elicit [name]'s feelings about health status, treatment plan, family support, faith background, and desired outcomes.   SPIRITUAL CARE PLAN   Spiritual Care Issues Still Outstanding: Chaplain will continue to follow    If immediate needs arise, please contact [facility] 24 hour on call [phone]   [name], Chaplain  04/05/2024 5:03 PM

## 2024-04-05 NOTE — Group Note (Signed)
 Date:  04/05/2024 Time:  11:09 AM  Group Topic/Focus:  Personal Choices and Values:   The focus of this group is to help patients assess and explore the importance of values in their lives, how their values affect their decisions, how they express their values and what opposes their expression.    Participation Level:  Minimal  Participation Quality:  Resistant  Affect:  Defensive  Cognitive:  Alert  Insight: Limited  Engagement in Group:  Resistant  Modes of Intervention:  Confrontation  Additional Comments:  Pt was resistant to group therapy topics.  [name] 04/05/2024, 11:09 AM

## 2024-04-05 NOTE — Plan of Care (Signed)
" °  Problem: Coping: Goal: Ability to identify and develop effective coping behavior will improve 04/05/2024 1904 by Cyrus Law, RN Outcome: Progressing 04/05/2024 1902 by Cyrus Law, RN Outcome: Progressing   Problem: Self-Concept: Goal: Ability to identify factors that promote anxiety will improve 04/05/2024 1904 by Cyrus Law, RN Outcome: Progressing 04/05/2024 1902 by Cyrus Law, RN Outcome: Progressing Goal: Level of anxiety will decrease Outcome: Progressing   "

## 2024-04-05 NOTE — Progress Notes (Signed)
" °   04/05/24 2200  Psych Admission Type (Psych Patients Only)  Admission Status Voluntary  Psychosocial Assessment  Patient Complaints None  Eye Contact Fair  Facial Expression Animated  Affect Appropriate to circumstance  Speech Logical/coherent  Interaction Assertive  Motor Activity Slow  Appearance/Hygiene In scrubs  Behavior Characteristics Cooperative;Appropriate to situation  Mood Pleasant  Thought Process  Coherency Disorganized  Content Preoccupation  Delusions None reported or observed  Perception WDL  Hallucination None reported or observed  Judgment Impaired  Confusion Mild  Danger to Self  Current suicidal ideation? Denies  Danger to Others  Danger to Others None reported or observed    "

## 2024-04-05 NOTE — Group Note (Signed)
 Date:  04/05/2024 Time:  8:55 PM  Group Topic/Focus:  Personal Choices and Values:   The focus of this group is to help patients assess and explore the importance of values in their lives, how their values affect their decisions, how they express their values and what opposes their expression.    Participation Level:  Active  Participation Quality:  Appropriate  Affect:  Appropriate  Cognitive:  Alert  Insight: Good  Engagement in Group:  Limited  Modes of Intervention:  Discussion  Additional Comments:    [name] 04/05/2024, 8:55 PM

## 2024-04-05 NOTE — Progress Notes (Signed)
" °   04/04/24 2100  Psych Admission Type (Psych Patients Only)  Admission Status Voluntary  Psychosocial Assessment  Patient Complaints Anxiety;Irritability  Eye Contact Brief  Facial Expression Animated;Angry  Affect Irritable  Speech Logical/coherent  Interaction Needy;Assertive  Motor Activity Slow  Appearance/Hygiene In scrubs  Behavior Characteristics Agitated;Irritable  Mood Irritable  Thought Process  Coherency Disorganized  Content Paranoia  Delusions None reported or observed  Perception WDL  Hallucination None reported or observed  Judgment Impaired  Confusion Mild  Danger to Self  Current suicidal ideation? Denies  Danger to Others  Danger to Others None reported or observed    "

## 2024-04-05 NOTE — Plan of Care (Signed)
 " Problem: Education: Goal: Ability to state activities that reduce stress will improve Outcome: Progressing   Problem: Coping: Goal: Ability to identify and develop effective coping behavior will improve Outcome: Progressing   Problem: Self-Concept: Goal: Ability to identify factors that promote anxiety will improve Outcome: Progressing Goal: Level of anxiety will decrease Outcome: Progressing Goal: Ability to modify response to factors that promote anxiety will improve Outcome: Progressing   Problem: Education: Goal: Utilization of techniques to improve thought processes will improve Outcome: Progressing Goal: Knowledge of the prescribed therapeutic regimen will improve Outcome: Progressing   Problem: Activity: Goal: Interest or engagement in leisure activities will improve Outcome: Progressing Goal: Imbalance in normal sleep/wake cycle will improve Outcome: Progressing   Problem: Coping: Goal: Coping ability will improve Outcome: Progressing Goal: Will verbalize feelings Outcome: Progressing   Problem: Health Behavior/Discharge Planning: Goal: Ability to make decisions will improve Outcome: Progressing Goal: Compliance with therapeutic regimen will improve Outcome: Progressing   Problem: Role Relationship: Goal: Will demonstrate positive changes in social behaviors and relationships Outcome: Progressing   Problem: Safety: Goal: Ability to disclose and discuss suicidal ideas will improve Outcome: Progressing Goal: Ability to identify and utilize support systems that promote safety will improve Outcome: Progressing   Problem: Self-Concept: Goal: Will verbalize positive feelings about self Outcome: Progressing Goal: Level of anxiety will decrease Outcome: Progressing   Problem: Education: Goal: Knowledge of [facility] Education information/materials will improve Outcome: Progressing Goal: Emotional status will improve Outcome: Progressing Goal:  Mental status will improve Outcome: Progressing Goal: Verbalization of understanding the information provided will improve Outcome: Progressing   Problem: Activity: Goal: Interest or engagement in activities will improve Outcome: Progressing Goal: Sleeping patterns will improve Outcome: Progressing   Problem: Coping: Goal: Ability to verbalize frustrations and anger appropriately will improve Outcome: Progressing Goal: Ability to demonstrate self-control will improve Outcome: Progressing   Problem: Health Behavior/Discharge Planning: Goal: Identification of resources available to assist in meeting health care needs will improve Outcome: Progressing Goal: Compliance with treatment plan for underlying cause of condition will improve Outcome: Progressing   Problem: Physical Regulation: Goal: Ability to maintain clinical measurements within normal limits will improve Outcome: Progressing   Problem: Safety: Goal: Periods of time without injury will increase Outcome: Progressing   Problem: Education: Goal: Knowledge of General Education information will improve Description: Including pain rating scale, medication(s)/side effects and non-pharmacologic comfort measures Outcome: Progressing   Problem: Health Behavior/Discharge Planning: Goal: Ability to manage health-related needs will improve Outcome: Progressing   Problem: Clinical Measurements: Goal: Ability to maintain clinical measurements within normal limits will improve Outcome: Progressing Goal: Will remain free from infection Outcome: Progressing Goal: Diagnostic test results will improve Outcome: Progressing Goal: Respiratory complications will improve Outcome: Progressing Goal: Cardiovascular complication will be avoided Outcome: Progressing   Problem: Activity: Goal: Risk for activity intolerance will decrease Outcome: Progressing   Problem: Nutrition: Goal: Adequate nutrition will be maintained Outcome:  Progressing   Problem: Coping: Goal: Level of anxiety will decrease Outcome: Progressing   Problem: Elimination: Goal: Will not experience complications related to bowel motility Outcome: Progressing Goal: Will not experience complications related to urinary retention Outcome: Progressing   Problem: Pain Managment: Goal: General experience of comfort will improve and/or be controlled Outcome: Progressing   Problem: Safety: Goal: Ability to remain free from injury will improve Outcome: Progressing   Problem: Skin Integrity: Goal: Risk for impaired skin integrity will decrease Outcome: Progressing   Problem: Education: Goal: Will be free of psychotic symptoms  Outcome: Progressing   "

## 2024-04-06 DIAGNOSIS — F333 Major depressive disorder, recurrent, severe with psychotic symptoms: Secondary | ICD-10-CM | POA: Diagnosis not present

## 2024-04-06 NOTE — Group Note (Signed)
 Date:  04/06/2024 Time:  4:10 PM  Group Topic/Focus:  Managing Feelings:   The focus of this group is to identify what feelings patients have difficulty handling and develop a plan to handle them in a healthier way upon discharge.  Meditation is excellent for elderly patients as it improves cognitive health, reduces stress, manages chronic pain, boosts mood, enhances sleep, and increases emotional resilience, helping combat age-related issues like memory loss, anxiety, and loneliness by promoting brain plasticity and a sense of calm, making it a simple, adaptable tool for better overall quality of life.   Participation Level:  Did Not Attend   Harlene LITTIE Gavel 04/06/2024, 4:10 PM

## 2024-04-06 NOTE — [facility] Counselor (Signed)
 CSW contacted the patient's daughter.  Daughter had questions about the patient being discharged.    CSW explained that patient has been agitated for about two days and that has prevented a discharge.   CSW inquired about aftercare providers and daughter reported that pt has not had mental health treatment in the past.   Sherryle Margo, MSW, LCSW 04/06/2024 3:59 PM

## 2024-04-06 NOTE — Group Note (Signed)
 Recreation Therapy Group Note   Group Topic:Animal Assisted Therapy   Group Date: 04/06/2024 Start Time: 1030 End Time: 1100 Facilitators: Celestia Jeoffrey BRAVO, LRT, CTRS Location: Dayroom  Group Description: AAA. Animal-Assisted Activity provides opportunities for motivational, educational, therapeutic and/or recreational benefits to enhance quality of life. Selinda and Rollo visited the unit to interact with patients.   Goal Areas Addressed:  Reduced anxiety and stress Improved mood Increased social interaction Enhanced communication skills Reduced loneliness and isolation Improved emotional regulation   Affect/Mood: Appropriate   Participation Level: Active and Engaged   Participation Quality: Independent   Behavior: Calm and Cooperative   Speech/Thought Process: Coherent   Insight: Fair   Judgement: Fair    Modes of Intervention: Activity   Patient Response to Interventions:  Attentive, Engaged, and Interested    Education Outcome:  Acknowledges education   Clinical Observations/Individualized Feedback: Eva was active in their participation of session activities and group discussion. Pt interacted well with LRT and peers duration of session.    Plan: Continue to engage patient in RT group sessions 2-3x/week.   Jeoffrey BRAVO Celestia, LRT, CTRS 04/06/2024 11:50 AM

## 2024-04-06 NOTE — Group Note (Signed)
 Date:  04/06/2024 Time:  11:23 PM  Group Topic/Focus:  Wrap-Up Group:   The focus of this group is to help patients review their daily goal of treatment and discuss progress on daily workbooks.    Participation Level:  Active  Participation Quality:  Appropriate  Affect:  Appropriate  Cognitive:  Alert  Insight: Appropriate  Engagement in Group:  Engaged  Modes of Intervention:  Discussion  Additional Comments:    [name] 04/06/2024, 11:23 PM

## 2024-04-06 NOTE — Group Note (Signed)
 Date:  04/06/2024 Time:  11:34 PM  Group Topic/Focus:  Wrap-Up Group:   The focus of this group is to help patients review their daily goal of treatment and discuss progress on daily workbooks.  Coping Skills   Participation Level:  Did Not Attend  Participation Quality:  Attentive  Affect:  Not Congruent  Cognitive:  Lacking  Insight: None  Engagement in Group:  None  Modes of Intervention:  Support and none  Additional Comments:    Gwendlyn LITTIE Sharps 04/06/2024, 11:34 PM

## 2024-04-06 NOTE — Progress Notes (Signed)
 Patient standing at the Nurse Station when she was redirected to go to the dayroom or her room during meeting Patient became very agitated holding her feast towards the Nurse yelling out loud. Another Staff tried to redirect Patient but now success Patient was left alone then later walked to her room she started yelling standing by the door of her room. She picked up her laundry basket when in the dayroom with it stating I am leaving let me call my daughter to come get me out of this place Patient was redirected to her room. Prn Zyprexa  5 mg PO given for agitation with lots of encouragement. Patient took PO mediation. Stated I am sorry for the way I was earlier. Support provided as needed. Prn Tylenol  given for right foot pain. Prn medications were effective. Will continue to monitor.

## 2024-04-06 NOTE — Progress Notes (Signed)
" °   04/06/24 0542  15 Minute Checks  Location Bedroom  Visual Appearance Calm  Behavior Sleeping  Sleep (Behavioral Health Patients Only)  Calculate sleep? (Click Yes once per 24 hr at 0600 safety check) Yes  Documented sleep last 24 hours 6.5    "

## 2024-04-06 NOTE — Progress Notes (Signed)
 [facility] MD Progress Note  04/06/2024 11:15 PM [name]  MRN:  [id number]  daughter: [name] [phone] provider and daughter discussed the treatment plan extensively and updated on medications Subjective:  Chart reviewed, case discussed in multidisciplinary meeting, patient seen during rounds.    Patient is noted to be standing close to the nurses station.  She makes minimal eye contact.  When asked about any complaints or concerns she talks about having pain over her shoulders and head.  She displays disorganized behavior where she does not give linear responses to the questions but continues to talk about random things.  Per nursing patient took her medications.  Later in the afternoon patient is noted to be yelling and screaming using a lot of curse words towards the staff members, slamming doors.  Will reach out to the family to discuss and update on the treatment plan. Past Psychiatric History: see h&P Family History: History reviewed. No pertinent family history. Social History:  Social History   Substance and Sexual Activity  Alcohol Use Yes   Comment: 3-4 40 oz beers per week - occasionally     Social History   Substance and Sexual Activity  Drug Use No    Social History   Socioeconomic History   Marital status: Widowed    Spouse name: Not on file   Number of children: Not on file   Years of education: Not on file   Highest education level: High school graduate  Occupational History   Not on file  Tobacco Use   Smoking status: Every Day    Current packs/day: 0.33    Types: Cigarettes   Smokeless tobacco: Never  Vaping Use   Vaping status: Never Used  Substance and Sexual Activity   Alcohol use: Yes    Comment: 3-4 40 oz beers per week - occasionally   Drug use: No   Sexual activity: Not Currently  Other Topics Concern   Not on file  Social History Narrative   Not on file   Social Drivers of Health   Tobacco Use: High Risk (03/25/2024)   Patient  History    Smoking Tobacco Use: Every Day    Smokeless Tobacco Use: Never    Passive Exposure: Not on file  Financial Resource Strain: Not on file  Food Insecurity: No Food Insecurity (03/26/2024)   Epic    Worried About Programme Researcher, Broadcasting/film/video in the Last Year: Never true    Ran Out of Food in the Last Year: Never true  Transportation Needs: No Transportation Needs (03/26/2024)   Epic    Lack of Transportation (Medical): No    Lack of Transportation (Non-Medical): No  Physical Activity: Not on file  Stress: Not on file  Social Connections: Not on file  Depression (PHQ2-9): Medium Risk (11/24/2023)   Depression (PHQ2-9)    PHQ-2 Score: 5  Alcohol Screen: Low Risk (03/29/2024)   Alcohol Screen    Last Alcohol Screening Score (AUDIT): 0  Housing: Low Risk (03/26/2024)   Epic    Unable to Pay for Housing in the Last Year: No    Number of Times Moved in the Last Year: 0    Homeless in the Last Year: No  Utilities: Not At Risk (03/26/2024)   Epic    Threatened with loss of utilities: No  Health Literacy: Not on file   Past Medical History:  Past Medical History:  Diagnosis Date   Depression    Hypertension     Past Surgical  History:  Procedure Laterality Date   BRAIN SURGERY     BREAST BIOPSY Left 2011   CORE W/CLIP   EYE SURGERY Bilateral    glaucoma   TUBAL LIGATION      Current Medications: Current Facility-Administered Medications  Medication Dose Route Frequency Provider Last Rate Last Admin   acetaminophen  (TYLENOL ) tablet 650 mg  650 mg Oral Q4H PRN [name], [name], MD   650 mg at 04/06/24 1518   albuterol  (PROVENTIL ) (2.5 MG/3ML) 0.083% nebulizer solution 3 mL  3 mL Inhalation Q4H PRN [name], [name], MD       alum & mag hydroxide-simeth (MAALOX/MYLANTA) 200-200-20 MG/5ML suspension 30 mL  30 mL Oral Q4H PRN [name], [name], MD   30 mL at 04/05/24 1633   amLODipine  (NORVASC ) tablet 10 mg  10 mg Oral Daily [name], [name], MD   10 mg at 04/06/24 0940   divalproex   (DEPAKOTE  ER) 24 hr tablet 750 mg  750 mg Oral Daily Sammi Stolarz, MD   750 mg at 04/06/24 9061   FLUoxetine  (PROZAC ) capsule 40 mg  40 mg Oral q morning Treana Lacour, MD   40 mg at 04/06/24 0940   folic acid  (FOLVITE ) tablet 1 mg  1 mg Oral Daily Addley Ballinger, MD   1 mg at 04/06/24 0940   hydrochlorothiazide  (HYDRODIURIL ) tablet 25 mg  25 mg Oral Daily Nashalie Sallis, MD   25 mg at 04/06/24 0940   magnesium  hydroxide (MILK OF MAGNESIA) suspension 30 mL  30 mL Oral Daily PRN [name], [name], MD       nicotine  (NICODERM CQ  - dosed in mg/24 hours) patch 14 mg  14 mg Transdermal Daily Mushka Laconte, MD   14 mg at 04/05/24 9096   nicotine  polacrilex (NICORETTE ) gum 2 mg  2 mg Oral QID PRN [name], [name], MD   2 mg at 04/06/24 2216   OLANZapine  (ZYPREXA ) tablet 5 mg  5 mg Oral Q8H PRN Mose Colaizzi, MD   5 mg at 04/06/24 1505   Or   OLANZapine  (ZYPREXA ) injection 5 mg  5 mg Intramuscular Q8H PRN Jamonta Goerner, MD   5 mg at 04/04/24 1340   ondansetron  (ZOFRAN -ODT) disintegrating tablet 4 mg  4 mg Oral Q8H PRN Erikka Follmer, MD       pantoprazole  (PROTONIX ) EC tablet 40 mg  40 mg Oral Daily [name], [name], MD   40 mg at 04/06/24 0940   risperiDONE  (RISPERDAL ) tablet 1 mg  1 mg Oral q AM [name], [name], MD   1 mg at 04/06/24 0800   risperiDONE  (RISPERDAL ) tablet 3 mg  3 mg Oral QHS Jacquie Lukes, MD   3 mg at 04/06/24 2100   thiamine  (VITAMIN B1) tablet 100 mg  100 mg Oral Daily Ahmadou Bolz, MD   100 mg at 04/06/24 0940    Lab Results:  No results found for this or any previous visit (from the past 48 hours).   Blood Alcohol level:  Lab Results  Component Value Date   [facility] <15 03/25/2024   ETH <10 03/03/2020    Metabolic Disorder Labs: Lab Results  Component Value Date   HGBA1C 5.4 11/24/2023   MPG 108 11/24/2023   No results found for: PROLACTIN Lab Results  Component Value Date   CHOL 242 (H) 11/24/2023   TRIG 97 11/24/2023   HDL 69 11/24/2023    CHOLHDL 3.5 11/24/2023   LDLCALC 152 (H) 11/24/2023    Physical Findings: AIMS:  , ,  ,  ,  CIWA:    COWS:      Psychiatric Specialty Exam:  Presentation  General Appearance:  Appropriate for Environment  Eye Contact: Fair  Speech: Clear and Coherent  Speech Volume: Normal    Mood and Affect  Mood: Labile; Irritable  Affect: Labile   Thought Process  Thought Processes: Disorganized  Orientation:Partial  Thought Content:Illogical; Paranoid Ideation; Delusions  Hallucinations: Auditory hallucinations patient responding to internal stimuli  Ideas of Reference:Delusions  Suicidal Thoughts: Denies  Homicidal Thoughts: Denies   Sensorium  Memory: Remote Poor; Immediate Poor  Judgment: Impaired  Insight: Shallow   Executive Functions  Concentration: Poor  Attention Span: Poor  Recall: -- (unable to assess)  Fund of Knowledge: -- (unable to assess)  Language: -- (unable to assess)   Psychomotor Activity  Psychomotor Activity: No data recorded  Musculoskeletal: Strength & Muscle Tone: within normal limits Gait & Station: normal Assets  Assets: Social Support    Physical Exam: Physical Exam ROS Blood pressure (!) 125/92, pulse 85, temperature 98.4 F (36.9 C), resp. rate 14, height 5' 7.01 (1.702 m), weight 60.1 kg, SpO2 100%. Body mass index is 20.75 kg/m.  Diagnosis: Active Problems:   MDD (major depressive disorder), recurrent, severe, with psychosis (HCC) R/O   Treatment Plan Summary:  Safety and Monitoring:             -- Voluntary admission to inpatient psychiatric unit for safety, stabilization and treatment             -- Daily contact with patient to assess and evaluate symptoms and progress in treatment             -- Patient's case to be discussed in multi-disciplinary team meeting             -- Observation Level: q15 minute checks             -- Vital signs:  q12 hours             -- Precautions:  suicide, elopement, and assault   2. Psychiatric Diagnoses and Treatment:              Prozac  40 mg daily  risperdal  is currently at 3 mg nightly and 1 mg in the morning Depakote  ER 750 mg is added for mood stabilization -- The risks/benefits/side-effects/alternatives to this medication were discussed in detail with the patient and time was given for questions. The patient consents to medication trial.                -- Metabolic profile and EKG monitoring obtained while on an atypical antipsychotic (BMI: Lipid Panel: HbgA1c: QTc:)              -- Encouraged patient to participate in unit milieu and in scheduled group therapies                            3. Medical Issues Being Addressed:  4. Discharge Planning:   -- Social work and case management to assist with discharge planning and identification of hospital follow-up needs prior to discharge  -- Estimated LOS: 3-4 days  Allyn Foil, MD 04/06/2024, 11:15 PM

## 2024-04-06 NOTE — Plan of Care (Signed)
  Problem: Coping: Goal: Ability to identify and develop effective coping behavior will improve Outcome: Not Progressing   Problem: Self-Concept: Goal: Ability to identify factors that promote anxiety will improve Outcome: Not Progressing Goal: Level of anxiety will decrease Outcome: Not Progressing   

## 2024-04-06 NOTE — Group Note (Signed)
 LCSW Group Therapy Note    Group Date: 04/06/2024 Start Time: 1300 End Time: 1400   Type of Therapy and Topic: Group Therapy: Body Image  Participation Level:  Did Not Attend  Description of Group:  Patients were educated about body image and asked to think about whether they have a healthy or unhealthy body image. Patients were led in a discussion about factors that contribute to body image, both internal and external. Patients were asked to discuss strengths of the human body outside of appearance, such as being able to fight off diseases and provide stress relief. Lastly, patients were asked to identify one way in which they appreciate their own body outside of appearance.   Therapeutic Goals:   1. Patient will differentiate between a healthy and unhealthy body image. 2. Patient will identify what contributes to body image 3. Patient will discuss the strengths of the human body. 4. Patient will identify a positive attribute of their body outside of physical appearance.  Summary of Patient Progress:  Patient did not attend.   Therapeutic Modalities: Cognitive Behavioral Therapy; Solution-Focused Therapy  Aanyah Loa M Jonta Gastineau, LCSWA 04/06/2024  2:48 PM

## 2024-04-06 NOTE — Progress Notes (Signed)
 [facility] MD Progress Note  04/06/2024 12:11 PM [name]  MRN:  [id number]  daughter: [name] [phone] provider and daughter discussed the treatment plan extensively and updated on medications Subjective:  Chart reviewed, case discussed in multidisciplinary meeting, patient seen during rounds.    Patient is noted to be loud, aggressive and cussing the staff on the unit.  She is slamming the doors and walking towards other patients making racial slurs.  Patient is redirected to her room.  Patient's Risperdal  was adjusted to 1 mg in the morning and 3 mg at night.  Depakote  is added as a mood stabilizer.  Later in the day patient was able to the questions.  She denies hallucinations denies SI/HI.  She is unable to rationalize the events that led up to the admission she remains focused on her diet and needing more snacks. Past Psychiatric History: see h&P Family History: History reviewed. No pertinent family history. Social History:  Social History   Substance and Sexual Activity  Alcohol Use Yes   Comment: 3-4 40 oz beers per week - occasionally     Social History   Substance and Sexual Activity  Drug Use No    Social History   Socioeconomic History   Marital status: Widowed    Spouse name: Not on file   Number of children: Not on file   Years of education: Not on file   Highest education level: High school graduate  Occupational History   Not on file  Tobacco Use   Smoking status: Every Day    Current packs/day: 0.33    Types: Cigarettes   Smokeless tobacco: Never  Vaping Use   Vaping status: Never Used  Substance and Sexual Activity   Alcohol use: Yes    Comment: 3-4 40 oz beers per week - occasionally   Drug use: No   Sexual activity: Not Currently  Other Topics Concern   Not on file  Social History Narrative   Not on file   Social Drivers of Health   Tobacco Use: High Risk (03/25/2024)   Patient History    Smoking Tobacco Use: Every Day    Smokeless  Tobacco Use: Never    Passive Exposure: Not on file  Financial Resource Strain: Not on file  Food Insecurity: No Food Insecurity (03/26/2024)   Epic    Worried About Programme Researcher, Broadcasting/film/video in the Last Year: Never true    Ran Out of Food in the Last Year: Never true  Transportation Needs: No Transportation Needs (03/26/2024)   Epic    Lack of Transportation (Medical): No    Lack of Transportation (Non-Medical): No  Physical Activity: Not on file  Stress: Not on file  Social Connections: Not on file  Depression (PHQ2-9): Medium Risk (11/24/2023)   Depression (PHQ2-9)    PHQ-2 Score: 5  Alcohol Screen: Low Risk (03/29/2024)   Alcohol Screen    Last Alcohol Screening Score (AUDIT): 0  Housing: Low Risk (03/26/2024)   Epic    Unable to Pay for Housing in the Last Year: No    Number of Times Moved in the Last Year: 0    Homeless in the Last Year: No  Utilities: Not At Risk (03/26/2024)   Epic    Threatened with loss of utilities: No  Health Literacy: Not on file   Past Medical History:  Past Medical History:  Diagnosis Date   Depression    Hypertension     Past Surgical History:  Procedure Laterality  Date   BRAIN SURGERY     BREAST BIOPSY Left 2011   CORE W/CLIP   EYE SURGERY Bilateral    glaucoma   TUBAL LIGATION      Current Medications: Current Facility-Administered Medications  Medication Dose Route Frequency Provider Last Rate Last Admin   acetaminophen  (TYLENOL ) tablet 650 mg  650 mg Oral Q4H PRN [name], [name], MD   650 mg at 04/06/24 0201   albuterol  (PROVENTIL ) (2.5 MG/3ML) 0.083% nebulizer solution 3 mL  3 mL Inhalation Q4H PRN [name], [name], MD       alum & mag hydroxide-simeth (MAALOX/MYLANTA) 200-200-20 MG/5ML suspension 30 mL  30 mL Oral Q4H PRN [name], [name], MD   30 mL at 04/05/24 1633   amLODipine  (NORVASC ) tablet 10 mg  10 mg Oral Daily [name], [name], MD   10 mg at 04/06/24 0940   divalproex  (DEPAKOTE  ER) 24 hr tablet 750 mg  750 mg Oral Daily  Kameshia Madruga, MD   750 mg at 04/06/24 9061   FLUoxetine  (PROZAC ) capsule 40 mg  40 mg Oral q morning Riko Lumsden, MD   40 mg at 04/06/24 0940   folic acid  (FOLVITE ) tablet 1 mg  1 mg Oral Daily Corneisha Alvi, MD   1 mg at 04/06/24 0940   hydrochlorothiazide  (HYDRODIURIL ) tablet 25 mg  25 mg Oral Daily Melvina Pangelinan, MD   25 mg at 04/06/24 0940   magnesium  hydroxide (MILK OF MAGNESIA) suspension 30 mL  30 mL Oral Daily PRN [name], [name], MD       nicotine  (NICODERM CQ  - dosed in mg/24 hours) patch 14 mg  14 mg Transdermal Daily Asencion Guisinger, MD   14 mg at 04/05/24 9096   nicotine  polacrilex (NICORETTE ) gum 2 mg  2 mg Oral QID PRN [name], [name], MD   2 mg at 04/06/24 0940   OLANZapine  (ZYPREXA ) tablet 5 mg  5 mg Oral Q8H PRN Brynlei Klausner, MD   5 mg at 04/05/24 0700   Or   OLANZapine  (ZYPREXA ) injection 5 mg  5 mg Intramuscular Q8H PRN Jahmil Macleod, MD   5 mg at 04/04/24 1340   ondansetron  (ZOFRAN -ODT) disintegrating tablet 4 mg  4 mg Oral Q8H PRN Mickelle Goupil, MD       pantoprazole  (PROTONIX ) EC tablet 40 mg  40 mg Oral Daily [name], [name], MD   40 mg at 04/06/24 0940   risperiDONE  (RISPERDAL ) tablet 1 mg  1 mg Oral q AM [name], [name], MD   1 mg at 04/06/24 0800   risperiDONE  (RISPERDAL ) tablet 3 mg  3 mg Oral QHS Javar Eshbach, MD   3 mg at 04/05/24 2110   thiamine  (VITAMIN B1) tablet 100 mg  100 mg Oral Daily Tsuyako Jolley, MD   100 mg at 04/06/24 0940    Lab Results:  No results found for this or any previous visit (from the past 48 hours).   Blood Alcohol level:  Lab Results  Component Value Date   [facility] <15 03/25/2024   ETH <10 03/03/2020    Metabolic Disorder Labs: Lab Results  Component Value Date   HGBA1C 5.4 11/24/2023   MPG 108 11/24/2023   No results found for: PROLACTIN Lab Results  Component Value Date   CHOL 242 (H) 11/24/2023   TRIG 97 11/24/2023   HDL 69 11/24/2023   CHOLHDL 3.5 11/24/2023   LDLCALC 152 (H) 11/24/2023     Physical Findings: AIMS:  , ,  ,  ,  CIWA:    COWS:      Psychiatric Specialty Exam:  Presentation  General Appearance:  Appropriate for Environment  Eye Contact: Fair  Speech: Clear and Coherent  Speech Volume: Normal    Mood and Affect  Mood: Labile; Irritable  Affect: Labile   Thought Process  Thought Processes: Disorganized  Orientation:Partial  Thought Content:Illogical; Paranoid Ideation; Delusions  Hallucinations: Auditory hallucinations patient responding to internal stimuli  Ideas of Reference:Delusions  Suicidal Thoughts: Denies  Homicidal Thoughts: Denies   Sensorium  Memory: Remote Poor; Immediate Poor  Judgment: Impaired  Insight: Shallow   Executive Functions  Concentration: Poor  Attention Span: Poor  Recall: -- (unable to assess)  Fund of Knowledge: -- (unable to assess)  Language: -- (unable to assess)   Psychomotor Activity  Psychomotor Activity: No data recorded  Musculoskeletal: Strength & Muscle Tone: within normal limits Gait & Station: normal Assets  Assets: Social Support    Physical Exam: Physical Exam ROS Blood pressure (!) 144/85, pulse 82, temperature 98 F (36.7 C), resp. rate 14, height 5' 7.01 (1.702 m), weight 60.1 kg, SpO2 100%. Body mass index is 20.75 kg/m.  Diagnosis: Active Problems:   MDD (major depressive disorder), recurrent, severe, with psychosis (HCC) R/O   Treatment Plan Summary:  Safety and Monitoring:             -- Voluntary admission to inpatient psychiatric unit for safety, stabilization and treatment             -- Daily contact with patient to assess and evaluate symptoms and progress in treatment             -- Patient's case to be discussed in multi-disciplinary team meeting             -- Observation Level: q15 minute checks             -- Vital signs:  q12 hours             -- Precautions: suicide, elopement, and assault   2. Psychiatric  Diagnoses and Treatment:              Prozac  40 mg daily  risperdal  is currently at 3 mg nightly and 1 mg in the morning Depakote  ER 750 mg is added for mood stabilization -- The risks/benefits/side-effects/alternatives to this medication were discussed in detail with the patient and time was given for questions. The patient consents to medication trial.                -- Metabolic profile and EKG monitoring obtained while on an atypical antipsychotic (BMI: Lipid Panel: HbgA1c: QTc:)              -- Encouraged patient to participate in unit milieu and in scheduled group therapies                            3. Medical Issues Being Addressed:  4. Discharge Planning:   -- Social work and case management to assist with discharge planning and identification of hospital follow-up needs prior to discharge  -- Estimated LOS: 3-4 days  Allyn Foil, MD 04/06/2024, 12:11 PM

## 2024-04-06 NOTE — Progress Notes (Signed)
" °   04/06/24 2000  Psych Admission Type (Psych Patients Only)  Admission Status Voluntary  Psychosocial Assessment  Patient Complaints None  Eye Contact Fair  Facial Expression Animated  Affect Appropriate to circumstance  Speech Soft  Interaction Assertive  Motor Activity Slow  Appearance/Hygiene In scrubs  Behavior Characteristics Cooperative;Appropriate to situation  Mood Pleasant  Thought Process  Coherency Disorganized  Content Preoccupation  Delusions None reported or observed  Perception WDL  Hallucination None reported or observed  Judgment Impaired  Confusion Mild  Danger to Self  Current suicidal ideation? Denies  Danger to Others  Danger to Others None reported or observed    "

## 2024-04-07 DIAGNOSIS — F333 Major depressive disorder, recurrent, severe with psychotic symptoms: Secondary | ICD-10-CM | POA: Diagnosis not present

## 2024-04-07 NOTE — BHH Counselor (Signed)
 CSW contacted pt's daughter, [name] [phone]), with Dr.J (RN Casemanager Alyson V. Also present).   Provider gave clinical updates regarding pt.   Pt's daughter agreeable to Friday discharge.   CSW will talk to pt about discharge tomorrow.   Lum Croft, MSW, [place] 04/07/2024 4:08 PM

## 2024-04-07 NOTE — Plan of Care (Signed)
   Problem: Education: Goal: Ability to state activities that reduce stress will improve Outcome: Progressing   Problem: Coping: Goal: Ability to identify and develop effective coping behavior will improve Outcome: Progressing

## 2024-04-07 NOTE — Progress Notes (Signed)
" °   04/07/24 2000  Psych Admission Type (Psych Patients Only)  Admission Status Voluntary  Psychosocial Assessment  Patient Complaints None  Eye Contact Fair  Facial Expression Animated  Affect Appropriate to circumstance  Speech Soft  Interaction Assertive  Motor Activity Slow  Appearance/Hygiene In scrubs  Behavior Characteristics Fidgety;Pacing  Mood Pleasant  Thought Process  Coherency Disorganized  Content Preoccupation  Delusions None reported or observed  Perception WDL  Hallucination None reported or observed  Judgment Impaired  Confusion Mild  Danger to Self  Current suicidal ideation? Denies  Danger to Others  Danger to Others None reported or observed    "

## 2024-04-07 NOTE — Group Note (Signed)
 Date:  04/07/2024 Time:  8:56 PM  Group Topic/Focus:  Self Care:   The focus of this group is to help patients understand the importance of self-care in order to improve or restore emotional, physical, spiritual, interpersonal, and financial health.    Participation Level:  Active  Participation Quality:  Appropriate  Affect:  Appropriate  Cognitive:  Appropriate  Insight: Improving  Engagement in Group:  Developing/Improving  Modes of Intervention:  Discussion  Additional Comments:    [name] 04/07/2024, 8:56 PM

## 2024-04-07 NOTE — Group Note (Signed)
 Date:  04/07/2024 Time:  11:52 AM  Group Topic/Focus:  Group Exercise    Participation Level:  Minimal  Participation Quality:  Appropriate  Affect:  Appropriate  Cognitive:  Appropriate  Insight: Appropriate  Engagement in Group:  Improving  Modes of Intervention:  Activity  Additional Comments:  Pt attended group  [name] E [name] 04/07/2024, 11:52 AM

## 2024-04-07 NOTE — Progress Notes (Signed)
 SI/HI/AVH: denies all  Behavior/Mood: fidgety, pacing, appropriate/labile    Interaction/Group attendance: demanding/ 2 of 2 groups    Medication/PRNs: compliant/Tylenol  x1, nic gum x1   Pain: 3/10 headache  Other: discharge expected Friday 1.23.26

## 2024-04-07 NOTE — B[facility] Group Notes (Signed)
 Spirituality Group   Group Goal: Support / Education around grief and loss   Group Description: Following introductions and group rules, group members engaged in facilitated group dialog and support around topic of loss, with particular support around experiences of loss in their lives. Group members identified types of loss (relationships / self / things) as well as patterns, circumstances, and changes that precipitate loss. Reflection invited on thoughts / feelings around loss, normalized grief responses, and recognized variety in grief experience. Group noted Worden's four tasks of grief in discussion. Group drew on Adlerian / Rogerian, narrative, MI, with [place] group therapy as a primary framework.   Observations: Shonika was present for about 0.75, needing to move from time to time but still actively engaged the group discussion.  Sheba Whaling L. Delores HERO.Div

## 2024-04-08 ENCOUNTER — Inpatient Hospital Stay: Payer: MEDICAID

## 2024-04-08 LAB — BASIC METABOLIC PANEL WITH GFR
Anion gap: 12 (ref 5–15)
BUN: 25 mg/dL — ABNORMAL HIGH (ref 8–23)
CO2: 23 mmol/L (ref 22–32)
Calcium: 8.8 mg/dL — ABNORMAL LOW (ref 8.9–10.3)
Chloride: 102 mmol/L (ref 98–111)
Creatinine, Ser: 0.71 mg/dL (ref 0.44–1.00)
GFR, Estimated: >60 mL/min
Glucose, Bld: 103 mg/dL — ABNORMAL HIGH (ref 70–99)
Potassium: 3.9 mmol/L (ref 3.5–5.1)
Sodium: 137 mmol/L (ref 135–145)

## 2024-04-08 LAB — CBC
HCT: 29.1 % — ABNORMAL LOW (ref 36.0–46.0)
Hemoglobin: 9.7 g/dL — ABNORMAL LOW (ref 12.0–15.0)
MCH: 32.4 pg (ref 26.0–34.0)
MCHC: 33.3 g/dL (ref 30.0–36.0)
MCV: 97.3 fL (ref 80.0–100.0)
Platelets: 338 K/uL (ref 150–400)
RBC: 2.99 MIL/uL — ABNORMAL LOW (ref 3.87–5.11)
RDW: 13 % (ref 11.5–15.5)
WBC: 9.1 K/uL (ref 4.0–10.5)
nRBC: 0 % (ref 0.0–0.2)

## 2024-04-08 LAB — C-REACTIVE PROTEIN: CRP: 2.1 mg/dL — ABNORMAL HIGH

## 2024-04-08 MED ORDER — DIVALPROEX SODIUM ER 500 MG PO TB24
1000.0000 mg | ORAL_TABLET | Freq: Every day | ORAL | Status: DC
  Administered 2024-04-09 – 2024-04-10 (×2): 1000 mg via ORAL
  Filled 2024-04-08 (×2): qty 2

## 2024-04-08 MED ORDER — NAPROXEN 250 MG PO TABS
375.0000 mg | ORAL_TABLET | Freq: Two times a day (BID) | ORAL | Status: DC
  Administered 2024-04-08 – 2024-04-10 (×4): 375 mg via ORAL
  Filled 2024-04-08 (×5): qty 2

## 2024-04-08 MED ORDER — FUROSEMIDE 20 MG PO TABS
40.0000 mg | ORAL_TABLET | Freq: Every day | ORAL | Status: DC
  Administered 2024-04-08 – 2024-04-09 (×2): 40 mg via ORAL
  Filled 2024-04-08 (×2): qty 2

## 2024-04-08 NOTE — Progress Notes (Signed)
 Behavior:  Confused.  Isolates to room.  Bedside commode placed next to bed.     Psych assessment: Denies SI/HI.  UTA AVH.  Group attendance:  0/3  Medication/ PRNs: Compliant.   Pain: 8/10 in bilateral lower extremities.   15 min checks in place for safety.    Lasix  and Naproxen  started.

## 2024-04-08 NOTE — Group Note (Unsigned)
 Date:  04/08/2024 Time:  12:17 PM  Group Topic/Focus:    group   Participation Level:  {BHH PARTICIPATION OZCZO:77735}  Participation Quality:  {B[facility] PARTICIPATION QUALITY:22265}  Affect:  {BHH AFFECT:22266}  Cognitive:  {B[facility] COGNITIVE:22267}  Insight: {BHH Insight2:20797}  Engagement in Group:  {BHH ENGAGEMENT IN HMNLE:77731}  Modes of Intervention:  {BHH MODES OF INTERVENTION:22269}  Additional Comments:  ***  Harlene LITTIE Gavel 04/08/2024, 12:17 PM

## 2024-04-08 NOTE — Group Note (Signed)
 Recreation Therapy Group Note   Group Topic:Coping Skills  Group Date: 04/08/2024 Start Time: 1400 End Time: 1430 Facilitators: Celestia Jeoffrey BRAVO, LRT, CTRS Location: Dayroom  Group Description: Mind Map.  Patient was provided a blank template of a diagram with 32 blank boxes in a tiered system, branching from the center (similar to a bubble chart). LRT directed patients to label the middle of the diagram Coping Skills. LRT and patients then came up with 8 different coping skills as examples. Pt were directed to record their coping skills in the 2nd tier boxes closest to the center.  Patients would then share their coping skills with the group as LRT wrote them out. LRT gave a handout of 99 different coping skills at the end of group.   Goal Area(s) Addressed: Patients will be able to define coping skills. Patient will identify new coping skills.  Patient will increase communication.   Affect/Mood: N/A   Participation Level: Did not attend    Clinical Observations/Individualized Feedback: Patient did not attend.  Plan: Continue to engage patient in RT group sessions 2-3x/week.   Jeoffrey BRAVO Celestia, LRT, CTRS 04/08/2024 4:21 PM

## 2024-04-08 NOTE — Progress Notes (Signed)
 Patient continuously trying to get out of bed even after told not to due to fall risk. Floor mats, bed alarm, and call bell within reach.

## 2024-04-08 NOTE — Progress Notes (Signed)
 Patient had to be redirected multiple times about the risk of getting out of the bed without assistance. Call bell explained to the patient. Bed alarm still in place. Patient is reoriented to time, place, and situation.

## 2024-04-08 NOTE — Group Note (Signed)
 Date:  04/08/2024 Time:  10:45 AM  Group Topic/Focus:   Effective exercises for geriatric patients focus on improving balance, strength, flexibility, and aerobic fitness, crucial for preventing falls and maintaining independence; key activities include walking, chair yoga/stretching, strength training with bands or light weights (like calf raises, wall push-ups, sit-to-stands), and balance drills (like single-leg stands and heel-to-toe walking), always with a doctor's approval.    Participation Level:  Did Not Attend   Harlene LITTIE Gavel 04/08/2024, 10:45 AM

## 2024-04-08 NOTE — Plan of Care (Signed)
" °  Problem: Self-Concept: Goal: Level of anxiety will decrease Outcome: Progressing   Problem: Activity: Goal: Interest or engagement in activities will improve Outcome: Progressing   "

## 2024-04-08 NOTE — Progress Notes (Signed)
 [facility] MD Progress Note  04/08/2024 9:44 PM [name]  MRN:  [id number]  daughter: [name] [phone] provider and daughter discussed the treatment plan extensively and updated on medications Subjective:  Chart reviewed, case discussed in multidisciplinary meeting, patient seen during rounds.  Today per nursing patient swelling of her lower extremities has gotten worse and she was noted to be tearful in pain yesterday.  Today patient is resting in bed stating she is not feeling well and is in pain.  She is following the recommendation to keep her feet elevated.  Hospitalist consult is placed for further evaluation of patient's worsening pedal edema and cognition.  Patient reports some intermittent voices but is unable to give any details.  She is not responding to stimuli.  She denies SI/HI/plan Past Psychiatric History: see h&P Family History: History reviewed. No pertinent family history. Social History:  Social History   Substance and Sexual Activity  Alcohol Use Yes   Comment: 3-4 40 oz beers per week - occasionally     Social History   Substance and Sexual Activity  Drug Use No    Social History   Socioeconomic History   Marital status: Widowed    Spouse name: Not on file   Number of children: Not on file   Years of education: Not on file   Highest education level: High school graduate  Occupational History   Not on file  Tobacco Use   Smoking status: Every Day    Current packs/day: 0.33    Types: Cigarettes   Smokeless tobacco: Never  Vaping Use   Vaping status: Never Used  Substance and Sexual Activity   Alcohol use: Yes    Comment: 3-4 40 oz beers per week - occasionally   Drug use: No   Sexual activity: Not Currently  Other Topics Concern   Not on file  Social History Narrative   Not on file   Social Drivers of Health   Tobacco Use: High Risk (03/25/2024)   Patient History    Smoking Tobacco Use: Every Day    Smokeless Tobacco Use: Never     Passive Exposure: Not on file  Financial Resource Strain: Not on file  Food Insecurity: No Food Insecurity (03/26/2024)   Epic    Worried About Programme Researcher, Broadcasting/film/video in the Last Year: Never true    Ran Out of Food in the Last Year: Never true  Transportation Needs: No Transportation Needs (03/26/2024)   Epic    Lack of Transportation (Medical): No    Lack of Transportation (Non-Medical): No  Physical Activity: Not on file  Stress: Not on file  Social Connections: Not on file  Depression (PHQ2-9): Medium Risk (11/24/2023)   Depression (PHQ2-9)    PHQ-2 Score: 5  Alcohol Screen: Low Risk (03/29/2024)   Alcohol Screen    Last Alcohol Screening Score (AUDIT): 0  Housing: Low Risk (03/26/2024)   Epic    Unable to Pay for Housing in the Last Year: No    Number of Times Moved in the Last Year: 0    Homeless in the Last Year: No  Utilities: Not At Risk (03/26/2024)   Epic    Threatened with loss of utilities: No  Health Literacy: Not on file   Past Medical History:  Past Medical History:  Diagnosis Date   Depression    Hypertension     Past Surgical History:  Procedure Laterality Date   BRAIN SURGERY     BREAST BIOPSY Left 2011  CORE W/CLIP   EYE SURGERY Bilateral    glaucoma   TUBAL LIGATION      Current Medications: Current Facility-Administered Medications  Medication Dose Route Frequency Provider Last Rate Last Admin   acetaminophen  (TYLENOL ) tablet 650 mg  650 mg Oral Q4H PRN [name], [name], MD   650 mg at 04/08/24 0449   albuterol  (PROVENTIL ) (2.5 MG/3ML) 0.083% nebulizer solution 3 mL  3 mL Inhalation Q4H PRN [name], [name], MD       alum & mag hydroxide-simeth (MAALOX/MYLANTA) 200-200-20 MG/5ML suspension 30 mL  30 mL Oral Q4H PRN [name], [name], MD   30 mL at 04/05/24 1633   amLODipine  (NORVASC ) tablet 10 mg  10 mg Oral Daily [name], [name], MD   10 mg at 04/08/24 0912   [START ON 04/09/2024] divalproex  (DEPAKOTE  ER) 24 hr tablet 1,000 mg  1,000 mg Oral Daily  Shaylynne Lunt, MD       FLUoxetine  (PROZAC ) capsule 40 mg  40 mg Oral q morning Tinsleigh Slovacek, MD   40 mg at 04/08/24 9087   folic acid  (FOLVITE ) tablet 1 mg  1 mg Oral Daily Karlon Schlafer, MD   1 mg at 04/08/24 9087   furosemide  (LASIX ) tablet 40 mg  40 mg Oral Daily Krugh, Marissa C, DO   40 mg at 04/08/24 1322   magnesium  hydroxide (MILK OF MAGNESIA) suspension 30 mL  30 mL Oral Daily PRN [name], [name], MD       naproxen  (NAPROSYN ) tablet 375 mg  375 mg Oral BID WC Anthonella Klausner, MD   375 mg at 04/08/24 1623   nicotine  (NICODERM CQ  - dosed in mg/24 hours) patch 14 mg  14 mg Transdermal Daily Eeva Schlosser, MD   14 mg at 04/05/24 9096   nicotine  polacrilex (NICORETTE ) gum 2 mg  2 mg Oral QID PRN [name], [name], MD   2 mg at 04/07/24 0947   OLANZapine  (ZYPREXA ) tablet 5 mg  5 mg Oral Q8H PRN Rajvir Ernster, MD   5 mg at 04/08/24 9550   Or   OLANZapine  (ZYPREXA ) injection 5 mg  5 mg Intramuscular Q8H PRN Chevella Pearce, MD   5 mg at 04/04/24 1340   ondansetron  (ZOFRAN -ODT) disintegrating tablet 4 mg  4 mg Oral Q8H PRN Levy Cedano, MD       pantoprazole  (PROTONIX ) EC tablet 40 mg  40 mg Oral Daily [name], [name], MD   40 mg at 04/08/24 9087   risperiDONE  (RISPERDAL ) tablet 1 mg  1 mg Oral q AM [name], [name], MD   1 mg at 04/08/24 0738   risperiDONE  (RISPERDAL ) tablet 3 mg  3 mg Oral QHS Lashann Hagg, MD   3 mg at 04/08/24 2125   thiamine  (VITAMIN B1) tablet 100 mg  100 mg Oral Daily Justin Buechner, MD   100 mg at 04/08/24 9087    Lab Results:  Results for orders placed or performed during the hospital encounter of 03/25/24 (from the past 48 hours)  CBC     Status: Abnormal   Collection Time: 04/08/24  2:28 PM  Result Value Ref Range   WBC 9.1 4.0 - 10.5 K/uL   RBC 2.99 (L) 3.87 - 5.11 MIL/uL   Hemoglobin 9.7 (L) 12.0 - 15.0 g/dL   HCT 70.8 (L) 63.9 - 53.9 %   MCV 97.3 80.0 - 100.0 fL   MCH 32.4 26.0 - 34.0 pg   MCHC 33.3 30.0 - 36.0 g/dL   RDW  13.0 11.5  - 15.5 %   Platelets 338 150 - 400 K/uL   nRBC 0.0 0.0 - 0.2 %    Comment: Performed at [facility], [address] [place], [place] [address]  Basic metabolic panel with GFR     Status: Abnormal   Collection Time: 04/08/24  2:28 PM  Result Value Ref Range   Sodium 137 135 - 145 mmol/L   Potassium 3.9 3.5 - 5.1 mmol/L   Chloride 102 98 - 111 mmol/L   CO2 23 22 - 32 mmol/L   Glucose, Bld 103 (H) 70 - 99 mg/dL    Comment: Glucose reference range applies only to samples taken after fasting for at least 8 hours.   BUN 25 (H) 8 - 23 mg/dL   Creatinine, Ser 9.28 0.44 - 1.00 mg/dL   Calcium 8.8 (L) 8.9 - 10.3 mg/dL   GFR, Estimated >39 >39 mL/min    Comment: (NOTE) Calculated using the CKD-EPI Creatinine Equation (2021)    Anion gap 12 5 - 15    Comment: Performed at [facility], [address] [place], [place] [address]  C-reactive protein     Status: Abnormal   Collection Time: 04/08/24  2:28 PM  Result Value Ref Range   CRP 2.1 (H) <1.0 mg/dL    Comment: Performed at [facility], [address], [place], [place] [address]     Blood Alcohol level:  Lab Results  Component Value Date   [facility] <15 03/25/2024   ETH <10 03/03/2020    Metabolic Disorder Labs: Lab Results  Component Value Date   HGBA1C 5.4 11/24/2023   MPG 108 11/24/2023   No results found for: PROLACTIN Lab Results  Component Value Date   CHOL 242 (H) 11/24/2023   TRIG 97 11/24/2023   HDL 69 11/24/2023   CHOLHDL 3.5 11/24/2023   LDLCALC 152 (H) 11/24/2023    Physical Findings: AIMS:  , ,  ,  ,    CIWA:    COWS:      Psychiatric Specialty Exam:  Presentation  General Appearance:  Appropriate for Environment  Eye Contact: Fair  Speech: Clear and Coherent  Speech Volume: Normal    Mood and Affect  Mood: Labile; Irritable  Affect: Labile   Thought Process  Thought Processes: Disorganized  Orientation:Partial  Thought Content:Illogical; Paranoid  Ideation; Delusions  Hallucinations: Auditory hallucinations patient responding to internal stimuli  Ideas of Reference:Delusions  Suicidal Thoughts: Denies  Homicidal Thoughts: Denies   Sensorium  Memory: Remote Poor; Immediate Poor  Judgment: Impaired  Insight: Shallow   Executive Functions  Concentration: Poor  Attention Span: Poor  Recall: -- (unable to assess)  Fund of Knowledge: -- (unable to assess)  Language: -- (unable to assess)   Psychomotor Activity  Psychomotor Activity: No data recorded  Musculoskeletal: Strength & Muscle Tone: within normal limits Gait & Station: normal Assets  Assets: Social Support    Physical Exam: Physical Exam ROS Blood pressure 117/73, pulse 85, temperature (!) 97.2 F (36.2 C), resp. rate 18, height 5' 7.01 (1.702 m), weight 60.1 kg, SpO2 100%. Body mass index is 20.75 kg/m.  Diagnosis: Active Problems:   MDD (major depressive disorder), recurrent, severe, with psychosis (HCC) R/O   Treatment Plan Summary:  Safety and Monitoring:             -- Voluntary admission to inpatient psychiatric unit for safety, stabilization and treatment             --  Daily contact with patient to assess and evaluate symptoms and progress in treatment             -- Patient's case to be discussed in multi-disciplinary team meeting             -- Observation Level: q15 minute checks             -- Vital signs:  q12 hours             -- Precautions: suicide, elopement, and assault   2. Psychiatric Diagnoses and Treatment:              Prozac  40 mg daily  risperdal  is currently at 3 mg nightly and 1 mg in the morning Depakote  ER increased to 1000 mg is added for mood stabilization -- The risks/benefits/side-effects/alternatives to this medication were discussed in detail with the patient and time was given for questions. The patient consents to medication trial.                -- Metabolic profile and EKG monitoring  obtained while on an atypical antipsychotic (BMI: Lipid Panel: HbgA1c: QTc:)              -- Encouraged patient to participate in unit milieu and in scheduled group therapies                            3. Medical Issues Being Addressed:  4. Discharge Planning:   -- Social work and case management to assist with discharge planning and identification of hospital follow-up needs prior to discharge  -- Estimated LOS: 3-4 days  Allyn Foil, MD 04/08/2024, 9:44 PM

## 2024-04-08 NOTE — Consult Note (Addendum)
 Initial Consultation Note   Patient: [name] FMW:[id number] DOB: 1960/07/31 PCP: Edman Marsa PARAS, DO DOA: 03/25/2024 DOS: the patient was seen and examined on 04/08/2024 Primary service: Ruther Millie SAUNDERS, MD  Referring physician: Donnelly Mellow Reason for consult:  Lower extremity swelling   Assessment/Plan: Assessment and Plan: 64 y.o. female with past medical history of HTN, hx traumatic L ankle fracture with chronic left foot/ankle pain, chronic hepatitis C (unclear treatment status),  alcohol use disorder, recurrent major depressive disorder, voluntarily admitted to in the inpatient psychiatric unit on January 8th, 2026 with psychosis.  Hospitalist service was consulted today for lower extremity pain and swelling.    Lower extremity swelling  Fairly rapid development in the past two weeks, L>R, edema extends from toes to knee.  Pmhx limited but no signs of overt cardiac failure. Renal and liver function intact, albumen wnl.  Atypical antipsychotic induced peripheral edema is a consideration, although most commonly reported in patients treated with olanzapine , quetiapine, and risperidone .  There have been case reports of valproate-induced peripheral edema as well.   - update cbc and bmp - ECHO, b/l LE doppler  - TSH/FT4, CRP to screen for inflammatory arthropathy - lasix  40mg  daily for now, hold hydrochlorothiazide  while on lasix   - monitor chemistry while on lasix  - compression and elevation as tolerated    HTN  - well controlled.  - hold hydrochlorothiazide  while on lasix     TRH will continue to follow the patient.  HPI: [name] is a 64 y.o. female with past medical history of HTN, hx traumatic L ankle fracture with chronic left foot/ankle pain, alcohol use disorder, recurrent major depressive disorder, voluntarily admitted to in the inpatient psychiatric unit on January 8th, 2026. Hospitalist service was consulted today for lower  extremity pain and swelling.  History is limited due to patient's underlying dementia. Per unit staff, this swelling has developed within the last two weeks, causing increasing difficulty ambulating.  She was given NSAID this am for pain which seemed to be effective, as she was resting comfortably in bed during my encounter.  She was unable to localize or characterize pain. She denied chest pain, SOB, cough. She's had no fevers   Vitals wnl. Last labs drawn on the 8th significant for BNP 375, no other values for comparison. Renal and hepatic function were intact. CXR unremarkable.    Review of Systems: unable to review all systems due to the inability of the patient to answer questions. (Dementia) Past Medical History:  Diagnosis Date   Depression    Hypertension    Past Surgical History:  Procedure Laterality Date   BRAIN SURGERY     BREAST BIOPSY Left 2011   CORE W/CLIP   EYE SURGERY Bilateral    glaucoma   TUBAL LIGATION     Social History:  reports that she has been smoking cigarettes. She has never used smokeless tobacco. She reports current alcohol use. She reports that she does not use drugs.  Allergies[1]  History reviewed. No pertinent family history.  Prior to Admission medications  Medication Sig Start Date End Date Taking? Authorizing Provider  amLODipine  (NORVASC ) 5 MG tablet Take 1 tablet (5 mg total) by mouth daily. 11/24/23   Karamalegos, Marsa PARAS, DO  FLUoxetine  (PROZAC ) 40 MG capsule Take 1 capsule (40 mg total) by mouth once daily every morning. 11/24/23   Karamalegos, Marsa PARAS, DO  hydrochlorothiazide  (HYDRODIURIL ) 25 MG tablet Take 1 tablet (25 mg total) by mouth daily. 11/24/23  Karamalegos, Alexander J, DO  meloxicam  (MOBIC ) 15 MG tablet Take 1 tablet (15 mg total) by mouth daily as needed for pain. 11/24/23   Karamalegos, Marsa PARAS, DO  omeprazole  (PRILOSEC) 40 MG capsule Take 1 capsule (40 mg total) by mouth daily before breakfast. 11/24/23   Karamalegos,  Marsa PARAS, DO  ondansetron  (ZOFRAN -ODT) 4 MG disintegrating tablet Take 1 tablet (4 mg total) by mouth every 8 (eight) hours as needed for nausea or vomiting. 11/24/23   Karamalegos, Marsa PARAS, DO  Sofosbuvir -Velpatasvir  (EPCLUSA ) 400-100 MG TABS Take 1 tablet by mouth daily. 12/18/23   Fayette Bodily, MD    Physical Exam: Vitals:   04/06/24 1936 04/07/24 0731 04/07/24 1916 04/08/24 0714  BP: (!) 125/92 130/86 114/74 111/75  Pulse: 85 72 81 77  Resp: 14 17 14 18   Temp: 98.4 F (36.9 C) 97.9 F (36.6 C) (!) 97.4 F (36.3 C) 97.9 F (36.6 C)  TempSrc:      SpO2: 100% 100% 100% 100%  Weight:      Height:       Physical Exam Constitutional:      General: She is not in acute distress.    Appearance: She is not toxic-appearing.  HENT:     Head: Normocephalic and atraumatic.     Mouth/Throat:     Mouth: Mucous membranes are moist.  Eyes:     General: No scleral icterus.    Extraocular Movements: Extraocular movements intact.     Pupils: Pupils are equal, round, and reactive to light.  Cardiovascular:     Rate and Rhythm: Normal rate and regular rhythm.  Pulmonary:     Effort: Pulmonary effort is normal. No respiratory distress.     Breath sounds: Normal breath sounds. No wheezing or rales.  Abdominal:     General: Bowel sounds are normal. There is no distension.     Palpations: Abdomen is soft.     Tenderness: There is no abdominal tenderness.  Musculoskeletal:     Cervical back: Neck supple.     Comments: B/l edema from toes to knee. L>R.  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 Significant left ankle edema with limited ROM  TTP diffusely   Skin:    General: Skin is warm and dry.     Capillary Refill: Capillary refill takes less than 2 seconds.  Neurological:     Mental Status: She is alert. Mental status is at baseline.     Comments: Negative straight leg  raise      Data Reviewed:    Labs on Admission: I have personally reviewed following labs and imaging studies  CBC: No results for input(s): WBC, NEUTROABS, HGB, HCT, MCV, PLT in the last 168 hours. Basic Metabolic Panel: No results for input(s): NA, K, CL, CO2, GLUCOSE, BUN, CREATININE, CALCIUM, MG, PHOS in the last 168 hours. GFR: Estimated Creatinine Clearance: 68.3 mL/min (by C-G formula based on SCr of 0.64 mg/dL). Liver Function Tests: No results for input(s): AST, ALT, ALKPHOS, BILITOT, PROT, ALBUMIN in the last 168 hours. No results for input(s): LIPASE, AMYLASE in the last 168 hours. No results for input(s): AMMONIA in the last 168 hours. Coagulation Profile: No results for input(s): INR, PROTIME in the last 168 hours. Cardiac Enzymes: No results for input(s): CKTOTAL, CKMB, CKMBINDEX, TROPONINI in the last 168 hours. BNP (last 3 results) Recent Labs    03/25/24  0959  PROBNP 375.0*   HbA1C: No results for input(s): HGBA1C in the last 72 hours. CBG: No results for input(s): GLUCAP in the last 168 hours. Lipid Profile: No results for input(s): CHOL, HDL, LDLCALC, TRIG, CHOLHDL, LDLDIRECT in the last 72 hours. Thyroid Function Tests: No results for input(s): TSH, T4TOTAL, FREET4, T3FREE, THYROIDAB in the last 72 hours. Anemia Panel: No results for input(s): VITAMINB12, FOLATE, FERRITIN, TIBC, IRON, RETICCTPCT in the last 72 hours. Urine analysis:    Component Value Date/Time   COLORURINE YELLOW (A) 03/25/2024 1348   APPEARANCEUR CLOUDY (A) 03/25/2024 1348   LABSPEC 1.004 (L) 03/25/2024 1348   PHURINE 8.0 03/25/2024 1348   GLUCOSEU NEGATIVE 03/25/2024 1348   HGBUR MODERATE (A) 03/25/2024 1348   BILIRUBINUR NEGATIVE 03/25/2024 1348   KETONESUR NEGATIVE 03/25/2024 1348   PROTEINUR NEGATIVE 03/25/2024 1348   NITRITE NEGATIVE 03/25/2024 1348   LEUKOCYTESUR SMALL  (A) 03/25/2024 1348    Radiological Exams on Admission: No results found.   Primary team communication: case discussed with Dr. [name]  Thank you very much for involving us  in the care of your patient.  Author: Daved JAYSON Pump, DO 04/08/2024 12:17 PM  For on call review [email or web address].      [1]  Allergies Allergen Reactions   Lisinopril  Swelling

## 2024-04-08 NOTE — Progress Notes (Signed)
 [facility] MD Progress Note  04/08/2024 12:04 AM Brendan Gruwell  MRN:  [id number]  daughter: [name] [phone] provider and daughter discussed the treatment plan extensively and updated on medications Subjective:  Chart reviewed, case discussed in multidisciplinary meeting, patient seen during rounds.   Patient is noted to be sitting in the day room.  She is calm and cooperative today.  She is able to talk about her family and wants her daughter to come pick her up from the hospital.  She denies auditory/visual hallucinations and denies SI/HI/plan.  Per nursing patient displays intermittent episodes of screaming and agitation. Past Psychiatric History: see h&P Family History: History reviewed. No pertinent family history. Social History:  Social History   Substance and Sexual Activity  Alcohol Use Yes   Comment: 3-4 40 oz beers per week - occasionally     Social History   Substance and Sexual Activity  Drug Use No    Social History   Socioeconomic History   Marital status: Widowed    Spouse name: Not on file   Number of children: Not on file   Years of education: Not on file   Highest education level: High school graduate  Occupational History   Not on file  Tobacco Use   Smoking status: Every Day    Current packs/day: 0.33    Types: Cigarettes   Smokeless tobacco: Never  Vaping Use   Vaping status: Never Used  Substance and Sexual Activity   Alcohol use: Yes    Comment: 3-4 40 oz beers per week - occasionally   Drug use: No   Sexual activity: Not Currently  Other Topics Concern   Not on file  Social History Narrative   Not on file   Social Drivers of Health   Tobacco Use: High Risk (03/25/2024)   Patient History    Smoking Tobacco Use: Every Day    Smokeless Tobacco Use: Never    Passive Exposure: Not on file  Financial Resource Strain: Not on file  Food Insecurity: No Food Insecurity (03/26/2024)   Epic    Worried About Programme Researcher, Broadcasting/film/video in the Last  Year: Never true    Ran Out of Food in the Last Year: Never true  Transportation Needs: No Transportation Needs (03/26/2024)   Epic    Lack of Transportation (Medical): No    Lack of Transportation (Non-Medical): No  Physical Activity: Not on file  Stress: Not on file  Social Connections: Not on file  Depression (PHQ2-9): Medium Risk (11/24/2023)   Depression (PHQ2-9)    PHQ-2 Score: 5  Alcohol Screen: Low Risk (03/29/2024)   Alcohol Screen    Last Alcohol Screening Score (AUDIT): 0  Housing: Low Risk (03/26/2024)   Epic    Unable to Pay for Housing in the Last Year: No    Number of Times Moved in the Last Year: 0    Homeless in the Last Year: No  Utilities: Not At Risk (03/26/2024)   Epic    Threatened with loss of utilities: No  Health Literacy: Not on file   Past Medical History:  Past Medical History:  Diagnosis Date   Depression    Hypertension     Past Surgical History:  Procedure Laterality Date   BRAIN SURGERY     BREAST BIOPSY Left 2011   CORE W/CLIP   EYE SURGERY Bilateral    glaucoma   TUBAL LIGATION      Current Medications: Current Facility-Administered Medications  Medication Dose Route  Frequency Provider Last Rate Last Admin   acetaminophen  (TYLENOL ) tablet 650 mg  650 mg Oral Q4H PRN [name], [name], MD   650 mg at 04/07/24 1437   albuterol  (PROVENTIL ) (2.5 MG/3ML) 0.083% nebulizer solution 3 mL  3 mL Inhalation Q4H PRN [name], [name], MD       alum & mag hydroxide-simeth (MAALOX/MYLANTA) 200-200-20 MG/5ML suspension 30 mL  30 mL Oral Q4H PRN [name], [name], MD   30 mL at 04/05/24 1633   amLODipine  (NORVASC ) tablet 10 mg  10 mg Oral Daily [name], [name], MD   10 mg at 04/07/24 0947   divalproex  (DEPAKOTE  ER) 24 hr tablet 750 mg  750 mg Oral Daily Mandeep Ferch, MD   750 mg at 04/07/24 9051   FLUoxetine  (PROZAC ) capsule 40 mg  40 mg Oral q morning Amando Ishikawa, MD   40 mg at 04/07/24 9052   folic acid  (FOLVITE ) tablet 1 mg  1 mg Oral Daily  Mazella Deen, MD   1 mg at 04/07/24 9052   hydrochlorothiazide  (HYDRODIURIL ) tablet 25 mg  25 mg Oral Daily Osie Merkin, MD   25 mg at 04/07/24 9052   magnesium  hydroxide (MILK OF MAGNESIA) suspension 30 mL  30 mL Oral Daily PRN [name], [name], MD       nicotine  (NICODERM CQ  - dosed in mg/24 hours) patch 14 mg  14 mg Transdermal Daily Hitoshi Werts, MD   14 mg at 04/05/24 9096   nicotine  polacrilex (NICORETTE ) gum 2 mg  2 mg Oral QID PRN [name], [name], MD   2 mg at 04/07/24 0947   OLANZapine  (ZYPREXA ) tablet 5 mg  5 mg Oral Q8H PRN Oona Trammel, MD   5 mg at 04/06/24 1505   Or   OLANZapine  (ZYPREXA ) injection 5 mg  5 mg Intramuscular Q8H PRN Jevaun Strick, MD   5 mg at 04/04/24 1340   ondansetron  (ZOFRAN -ODT) disintegrating tablet 4 mg  4 mg Oral Q8H PRN Riggins Cisek, MD       pantoprazole  (PROTONIX ) EC tablet 40 mg  40 mg Oral Daily [name], [name], MD   40 mg at 04/07/24 9052   risperiDONE  (RISPERDAL ) tablet 1 mg  1 mg Oral q AM [name], [name], MD   1 mg at 04/07/24 0600   risperiDONE  (RISPERDAL ) tablet 3 mg  3 mg Oral QHS Haylea Schlichting, MD   3 mg at 04/07/24 2101   thiamine  (VITAMIN B1) tablet 100 mg  100 mg Oral Daily Khiry Pasquariello, MD   100 mg at 04/07/24 9052    Lab Results:  No results found for this or any previous visit (from the past 48 hours).   Blood Alcohol level:  Lab Results  Component Value Date   [facility] <15 03/25/2024   ETH <10 03/03/2020    Metabolic Disorder Labs: Lab Results  Component Value Date   HGBA1C 5.4 11/24/2023   MPG 108 11/24/2023   No results found for: PROLACTIN Lab Results  Component Value Date   CHOL 242 (H) 11/24/2023   TRIG 97 11/24/2023   HDL 69 11/24/2023   CHOLHDL 3.5 11/24/2023   LDLCALC 152 (H) 11/24/2023    Physical Findings: AIMS:  , ,  ,  ,    CIWA:    COWS:      Psychiatric Specialty Exam:  Presentation  General Appearance:  Appropriate for Environment  Eye  Contact: Fair  Speech: Clear and Coherent  Speech Volume: Normal  Mood and Affect  Mood: Labile; Irritable  Affect: Labile   Thought Process  Thought Processes: Disorganized  Orientation:Partial  Thought Content:Illogical; Paranoid Ideation; Delusions  Hallucinations: Auditory hallucinations patient responding to internal stimuli  Ideas of Reference:Delusions  Suicidal Thoughts: Denies  Homicidal Thoughts: Denies   Sensorium  Memory: Remote Poor; Immediate Poor  Judgment: Impaired  Insight: Shallow   Executive Functions  Concentration: Poor  Attention Span: Poor  Recall: -- (unable to assess)  Fund of Knowledge: -- (unable to assess)  Language: -- (unable to assess)   Psychomotor Activity  Psychomotor Activity: No data recorded  Musculoskeletal: Strength & Muscle Tone: within normal limits Gait & Station: normal Assets  Assets: Social Support    Physical Exam: Physical Exam ROS Blood pressure 114/74, pulse 81, temperature (!) 97.4 F (36.3 C), resp. rate 14, height 5' 7.01 (1.702 m), weight 60.1 kg, SpO2 100%. Body mass index is 20.75 kg/m.  Diagnosis: Active Problems:   MDD (major depressive disorder), recurrent, severe, with psychosis (HCC) R/O   Treatment Plan Summary:  Safety and Monitoring:             -- Voluntary admission to inpatient psychiatric unit for safety, stabilization and treatment             -- Daily contact with patient to assess and evaluate symptoms and progress in treatment             -- Patient's case to be discussed in multi-disciplinary team meeting             -- Observation Level: q15 minute checks             -- Vital signs:  q12 hours             -- Precautions: suicide, elopement, and assault   2. Psychiatric Diagnoses and Treatment:              Prozac  40 mg daily  risperdal  is currently at 3 mg nightly and 1 mg in the morning Depakote  ER 750 mg is added for mood stabilization --  The risks/benefits/side-effects/alternatives to this medication were discussed in detail with the patient and time was given for questions. The patient consents to medication trial.                -- Metabolic profile and EKG monitoring obtained while on an atypical antipsychotic (BMI: Lipid Panel: HbgA1c: QTc:)              -- Encouraged patient to participate in unit milieu and in scheduled group therapies                            3. Medical Issues Being Addressed:  4. Discharge Planning:   -- Social work and case management to assist with discharge planning and identification of hospital follow-up needs prior to discharge  -- Estimated LOS: 3-4 days  Allyn Foil, MD 04/08/2024, 12:04 AM

## 2024-04-08 NOTE — Progress Notes (Signed)
" °   04/08/24 9380  15 Minute Checks  Location Dayroom  Visual Appearance Calm  Behavior Composed  Sleep (Behavioral Health Patients Only)  Calculate sleep? (Click Yes once per 24 hr at 0600 safety check) Yes  Documented sleep last 24 hours 6.5    "

## 2024-04-08 NOTE — Group Note (Signed)
 Date:  04/08/2024 Time:  9:33 PM  Group Topic/Focus:  Goals Group:   The focus of this group is to help patients establish daily goals to achieve during treatment and discuss how the patient can incorporate goal setting into their daily lives to aide in recovery.    Participation Level:  Did Not Attend  Participation Quality:     Affect:    Cognitive:     Insight: None  Engagement in Group:  None  Modes of Intervention:     Additional Comments:    Sherrilyn JAYSON Redman 04/08/2024, 9:33 PM

## 2024-04-08 NOTE — Group Note (Signed)
 [facility] LCSW Group Therapy Note   Group Date: 04/08/2024 Start Time: 1315 End Time: 1345   Type of Therapy/Topic:  Group Therapy:  Emotion Regulation  Participation Level:  Did Not Attend   Mood:  Description of Group:    The purpose of this group is to assist patients in learning to regulate negative emotions and experience positive emotions. Patients will be guided to discuss ways in which they have been vulnerable to their negative emotions. These vulnerabilities will be juxtaposed with experiences of positive emotions or situations, and patients challenged to use positive emotions to combat negative ones. Special emphasis will be placed on coping with negative emotions in conflict situations, and patients will process healthy conflict resolution skills.  Therapeutic Goals: Patient will identify two positive emotions or experiences to reflect on in order to balance out negative emotions:  Patient will label two or more emotions that they find the most difficult to experience:  Patient will be able to demonstrate positive conflict resolution skills through discussion or role plays:   Summary of Patient Progress:   X    Therapeutic Modalities:   Cognitive Behavioral Therapy Feelings Identification Dialectical Behavioral Therapy   [name], LCSWA

## 2024-04-08 NOTE — Plan of Care (Signed)
  Problem: Health Behavior/Discharge Planning: Goal: Compliance with therapeutic regimen will improve Outcome: Progressing   Problem: Health Behavior/Discharge Planning: Goal: Ability to make decisions will improve Outcome: Not Progressing

## 2024-04-09 ENCOUNTER — Inpatient Hospital Stay
Admit: 2024-04-09 | Discharge: 2024-04-09 | Disposition: A | Payer: MEDICAID | Attending: Emergency Medicine | Admitting: Emergency Medicine

## 2024-04-09 ENCOUNTER — Other Ambulatory Visit: Payer: Self-pay

## 2024-04-09 DIAGNOSIS — R0609 Other forms of dyspnea: Secondary | ICD-10-CM

## 2024-04-09 LAB — ECHOCARDIOGRAM COMPLETE
AR max vel: 3.64 cm2
AV Area VTI: 3.83 cm2
AV Area mean vel: 3.48 cm2
AV Mean grad: 7 mmHg
AV Peak grad: 13.5 mmHg
Ao pk vel: 1.84 m/s
Area-P 1/2: 3.74 cm2
Height: 67.008 in
MV VTI: 4.69 cm2
S' Lateral: 2.7 cm
Weight: 2120 [oz_av]

## 2024-04-09 LAB — THYROID PANEL WITH TSH
Free Thyroxine Index: 1.3 (ref 1.2–4.9)
T3 Uptake Ratio: 22 % — ABNORMAL LOW (ref 24–39)
T4, Total: 5.8 ug/dL (ref 4.5–12.0)
TSH: 4.27 u[IU]/mL (ref 0.450–4.500)

## 2024-04-09 LAB — VALPROIC ACID LEVEL: Valproic Acid Lvl: 22 ug/mL — ABNORMAL LOW (ref 50–100)

## 2024-04-09 MED ORDER — AMLODIPINE BESYLATE 10 MG PO TABS
10.0000 mg | ORAL_TABLET | Freq: Every day | ORAL | 0 refills | Status: AC
  Filled 2024-04-09: qty 30, 30d supply, fill #0

## 2024-04-09 MED ORDER — FLUOXETINE HCL 40 MG PO CAPS
40.0000 mg | ORAL_CAPSULE | Freq: Every morning | ORAL | 0 refills | Status: AC
  Filled 2024-04-09 (×2): qty 30, 30d supply, fill #0

## 2024-04-09 MED ORDER — NAPROXEN 375 MG PO TABS
375.0000 mg | ORAL_TABLET | Freq: Two times a day (BID) | ORAL | 0 refills | Status: AC
  Filled 2024-04-09: qty 60, 30d supply, fill #0

## 2024-04-09 MED ORDER — VITAMIN B-1 100 MG PO TABS
100.0000 mg | ORAL_TABLET | Freq: Every day | ORAL | 0 refills | Status: AC
  Filled 2024-04-09: qty 30, 30d supply, fill #0

## 2024-04-09 MED ORDER — NICOTINE 14 MG/24HR TD PT24
14.0000 mg | MEDICATED_PATCH | Freq: Every day | TRANSDERMAL | 0 refills | Status: AC
  Filled 2024-04-09: qty 28, 28d supply, fill #0

## 2024-04-09 MED ORDER — FUROSEMIDE 20 MG PO TABS
40.0000 mg | ORAL_TABLET | Freq: Two times a day (BID) | ORAL | Status: DC
  Administered 2024-04-09 – 2024-04-10 (×2): 40 mg via ORAL
  Filled 2024-04-09 (×2): qty 2

## 2024-04-09 MED ORDER — DIVALPROEX SODIUM ER 500 MG PO TB24
1000.0000 mg | ORAL_TABLET | Freq: Every day | ORAL | 0 refills | Status: AC
  Filled 2024-04-09: qty 60, 30d supply, fill #0

## 2024-04-09 MED ORDER — FUROSEMIDE 40 MG PO TABS
40.0000 mg | ORAL_TABLET | Freq: Two times a day (BID) | ORAL | 0 refills | Status: AC
  Filled 2024-04-09: qty 30, 15d supply, fill #0

## 2024-04-09 MED ORDER — RISPERIDONE 1 MG PO TABS
1.0000 mg | ORAL_TABLET | Freq: Every morning | ORAL | 0 refills | Status: AC
  Filled 2024-04-09: qty 30, 30d supply, fill #0

## 2024-04-09 MED ORDER — FOLIC ACID 1 MG PO TABS
1.0000 mg | ORAL_TABLET | Freq: Every day | ORAL | 0 refills | Status: AC
  Filled 2024-04-09: qty 30, 30d supply, fill #0

## 2024-04-09 MED ORDER — ALBUTEROL SULFATE (2.5 MG/3ML) 0.083% IN NEBU
3.0000 mL | INHALATION_SOLUTION | RESPIRATORY_TRACT | 12 refills | Status: AC | PRN
  Filled 2024-04-09: qty 75, 5d supply, fill #0

## 2024-04-09 MED ORDER — RISPERIDONE 3 MG PO TABS
3.0000 mg | ORAL_TABLET | Freq: Every day | ORAL | 0 refills | Status: AC
  Filled 2024-04-09: qty 30, 30d supply, fill #0

## 2024-04-09 NOTE — Progress Notes (Signed)
" °   04/09/24 0600  15 Minute Checks  Location Bedroom  Visual Appearance Calm  Behavior Sleeping  Sleep (Behavioral Health Patients Only)  Calculate sleep? (Click Yes once per 24 hr at 0600 safety check) Yes  Documented sleep last 24 hours 8.25    "

## 2024-04-09 NOTE — Group Note (Signed)
 Recreation Therapy Group Note   Group Topic:Healthy Support Systems  Group Date: 04/09/2024 Start Time: 1400 End Time: 1445 Facilitators: Celestia Jeoffrey BRAVO, LRT, CTRS Location: Dayroom  Group Description: Straw Bridge. In groups or individually, patients were given 10 plastic drinking straws and an equal length of masking tape. Using the materials provided, patients were instructed to build a free-standing bridge-like structure to suspend an everyday item (ex: deck of cards) off the floor or table surface. All materials were required to be used in secondary school teacher. LRT facilitated post-activity discussion reviewing the importance of having strong and healthy support systems in our lives. LRT discussed how the people in our lives serve as the tape and the deck of cards we placed on top of our straw structure are the stressors we face in daily life. LRT and pts discussed what happens in our life when things get too heavy for us , and we don't have strong supports outside of the hospital. Pt shared 2 of their healthy supports in their life aloud in the group.   Goal Area(s) Addressed:  Patient will identify 2 healthy supports in their life. Patient will identify skills to successfully complete activity. Patient will identify correlation of this activity to life post-discharge.  Patient will build on frustration tolerance skills. Patient will increase team building and communication skills.    Affect/Mood: N/A   Participation Level: Did not attend    Clinical Observations/Individualized Feedback: Patient did not attend.  Plan: Continue to engage patient in RT group sessions 2-3x/week.   Jeoffrey BRAVO Celestia, LRT, CTRS 04/09/2024 4:30 PM

## 2024-04-09 NOTE — Progress Notes (Signed)
 [facility] MD Progress Note  04/09/2024 9:27 PM [name]  MRN:  [id number]  daughter: [name] [phone] provider and daughter discussed the treatment plan extensively and updated on medications  Subjective:  Chart reviewed, case discussed in multidisciplinary meeting, patient seen during rounds.   Patient is noted to be standing in the hallway.  She stands in the nurses station reporting pain in her ankles which are swollen.  Provider and nursing continue to encourage her to rest in bed with feet elevation but she remains adamant and stands at the nurses station.  She then request the nurse to send her to emergency room for her legs.  Patient was reminded that the hospital is checked on her feet and did all the workup which has been nonsignificant for any concerns.  She is on Lasix  twice daily to get the extra fluid out of her body.  Patient is calm and redirectable at times.  She denies SI/HI/plan and denies hallucinations  Past Psychiatric History: see h&P Family History: History reviewed. No pertinent family history. Social History:  Social History   Substance and Sexual Activity  Alcohol Use Yes   Comment: 3-4 40 oz beers per week - occasionally     Social History   Substance and Sexual Activity  Drug Use No    Social History   Socioeconomic History   Marital status: Widowed    Spouse name: Not on file   Number of children: Not on file   Years of education: Not on file   Highest education level: High school graduate  Occupational History   Not on file  Tobacco Use   Smoking status: Every Day    Current packs/day: 0.33    Types: Cigarettes   Smokeless tobacco: Never  Vaping Use   Vaping status: Never Used  Substance and Sexual Activity   Alcohol use: Yes    Comment: 3-4 40 oz beers per week - occasionally   Drug use: No   Sexual activity: Not Currently  Other Topics Concern   Not on file  Social History Narrative   Not on file   Social Drivers of  Health   Tobacco Use: High Risk (03/25/2024)   Patient History    Smoking Tobacco Use: Every Day    Smokeless Tobacco Use: Never    Passive Exposure: Not on file  Financial Resource Strain: Not on file  Food Insecurity: No Food Insecurity (03/26/2024)   Epic    Worried About Programme Researcher, Broadcasting/film/video in the Last Year: Never true    Ran Out of Food in the Last Year: Never true  Transportation Needs: No Transportation Needs (03/26/2024)   Epic    Lack of Transportation (Medical): No    Lack of Transportation (Non-Medical): No  Physical Activity: Not on file  Stress: Not on file  Social Connections: Not on file  Depression (PHQ2-9): Medium Risk (11/24/2023)   Depression (PHQ2-9)    PHQ-2 Score: 5  Alcohol Screen: Low Risk (03/29/2024)   Alcohol Screen    Last Alcohol Screening Score (AUDIT): 0  Housing: Low Risk (03/26/2024)   Epic    Unable to Pay for Housing in the Last Year: No    Number of Times Moved in the Last Year: 0    Homeless in the Last Year: No  Utilities: Not At Risk (03/26/2024)   Epic    Threatened with loss of utilities: No  Health Literacy: Not on file   Past Medical History:  Past Medical History:  Diagnosis Date   Depression    Hypertension     Past Surgical History:  Procedure Laterality Date   BRAIN SURGERY     BREAST BIOPSY Left 2011   CORE W/CLIP   EYE SURGERY Bilateral    glaucoma   TUBAL LIGATION      Current Medications: Current Facility-Administered Medications  Medication Dose Route Frequency Provider Last Rate Last Admin   acetaminophen  (TYLENOL ) tablet 650 mg  650 mg Oral Q4H PRN [name], [name], MD   650 mg at 04/08/24 2154   albuterol  (PROVENTIL ) (2.5 MG/3ML) 0.083% nebulizer solution 3 mL  3 mL Inhalation Q4H PRN [name], [name], MD       alum & mag hydroxide-simeth (MAALOX/MYLANTA) 200-200-20 MG/5ML suspension 30 mL  30 mL Oral Q4H PRN [name], [name], MD   30 mL at 04/05/24 1633   amLODipine  (NORVASC ) tablet 10 mg  10 mg Oral Daily [name],  [name], MD   10 mg at 04/09/24 0950   divalproex  (DEPAKOTE  ER) 24 hr tablet 1,000 mg  1,000 mg Oral Daily Magaret Justo, MD   1,000 mg at 04/09/24 9047   FLUoxetine  (PROZAC ) capsule 40 mg  40 mg Oral q morning Chales Pelissier, MD   40 mg at 04/09/24 9049   folic acid  (FOLVITE ) tablet 1 mg  1 mg Oral Daily Bettyjean Stefanski, MD   1 mg at 04/09/24 9049   furosemide  (LASIX ) tablet 40 mg  40 mg Oral BID Dezii, Alexandra, DO   40 mg at 04/09/24 1706   magnesium  hydroxide (MILK OF MAGNESIA) suspension 30 mL  30 mL Oral Daily PRN [name], [name], MD       naproxen  (NAPROSYN ) tablet 375 mg  375 mg Oral BID WC Haroun Cotham, MD   375 mg at 04/09/24 1706   nicotine  (NICODERM CQ  - dosed in mg/24 hours) patch 14 mg  14 mg Transdermal Daily Kerrington Sova, MD   14 mg at 04/05/24 9096   nicotine  polacrilex (NICORETTE ) gum 2 mg  2 mg Oral QID PRN [name], [name], MD   2 mg at 04/08/24 2154   OLANZapine  (ZYPREXA ) tablet 5 mg  5 mg Oral Q8H PRN Emonni Depasquale, MD   5 mg at 04/09/24 2116   Or   OLANZapine  (ZYPREXA ) injection 5 mg  5 mg Intramuscular Q8H PRN Arlyne Brandes, MD   5 mg at 04/04/24 1340   ondansetron  (ZOFRAN -ODT) disintegrating tablet 4 mg  4 mg Oral Q8H PRN Irfan Veal, MD       pantoprazole  (PROTONIX ) EC tablet 40 mg  40 mg Oral Daily [name], [name], MD   40 mg at 04/09/24 0950   risperiDONE  (RISPERDAL ) tablet 1 mg  1 mg Oral q AM [name], [name], MD   1 mg at 04/09/24 0748   risperiDONE  (RISPERDAL ) tablet 3 mg  3 mg Oral QHS Chazlyn Cude, MD   3 mg at 04/09/24 2116   thiamine  (VITAMIN B1) tablet 100 mg  100 mg Oral Daily Krystn Dermody, MD   100 mg at 04/09/24 9049    Lab Results:  Results for orders placed or performed during the hospital encounter of 03/25/24 (from the past 48 hours)  Thyroid Panel With TSH     Status: Abnormal   Collection Time: 04/08/24  2:28 PM  Result Value Ref Range   TSH 4.270 0.450 - 4.500 uIU/mL   T4, Total 5.8 4.5 - 12.0 ug/dL   T3 Uptake  Ratio 22 (L) 24 -  39 %   Free Thyroxine Index 1.3 1.2 - 4.9    Comment: (NOTE) Performed At: [facility] [address] [place], [place] [id number] Jennette Shorter MD Ey:[id number]   CBC     Status: Abnormal   Collection Time: 04/08/24  2:28 PM  Result Value Ref Range   WBC 9.1 4.0 - 10.5 K/uL   RBC 2.99 (L) 3.87 - 5.11 MIL/uL   Hemoglobin 9.7 (L) 12.0 - 15.0 g/dL   HCT 70.8 (L) 63.9 - 53.9 %   MCV 97.3 80.0 - 100.0 fL   MCH 32.4 26.0 - 34.0 pg   MCHC 33.3 30.0 - 36.0 g/dL   RDW 86.9 88.4 - 84.4 %   Platelets 338 150 - 400 K/uL   nRBC 0.0 0.0 - 0.2 %    Comment: Performed at [facility], [address], [place], [place] [address]  Basic metabolic panel with GFR     Status: Abnormal   Collection Time: 04/08/24  2:28 PM  Result Value Ref Range   Sodium 137 135 - 145 mmol/L   Potassium 3.9 3.5 - 5.1 mmol/L   Chloride 102 98 - 111 mmol/L   CO2 23 22 - 32 mmol/L   Glucose, Bld 103 (H) 70 - 99 mg/dL    Comment: Glucose reference range applies only to samples taken after fasting for at least 8 hours.   BUN 25 (H) 8 - 23 mg/dL   Creatinine, Ser 9.28 0.44 - 1.00 mg/dL   Calcium 8.8 (L) 8.9 - 10.3 mg/dL   GFR, Estimated >39 >39 mL/min    Comment: (NOTE) Calculated using the CKD-EPI Creatinine Equation (2021)    Anion gap 12 5 - 15    Comment: Performed at [facility], [address] [place], [place] [address]  C-reactive protein     Status: Abnormal   Collection Time: 04/08/24  2:28 PM  Result Value Ref Range   CRP 2.1 (H) <1.0 mg/dL    Comment: Performed at [facility], [address], [place], [place] [address]  Valproic acid level     Status: Abnormal   Collection Time: 04/09/24  6:36 AM  Result Value Ref Range   Valproic Acid Lvl 22 (L) 50 - 100 ug/mL    Comment: Performed at [facility], [address] [place], [place] [address]     Blood Alcohol level:  Lab Results  Component Value Date   [facility] <15 03/25/2024   ETH  <10 03/03/2020    Metabolic Disorder Labs: Lab Results  Component Value Date   HGBA1C 5.4 11/24/2023   MPG 108 11/24/2023   No results found for: PROLACTIN Lab Results  Component Value Date   CHOL 242 (H) 11/24/2023   TRIG 97 11/24/2023   HDL 69 11/24/2023   CHOLHDL 3.5 11/24/2023   LDLCALC 152 (H) 11/24/2023    Physical Findings: AIMS:  , ,  ,  ,    CIWA:    COWS:      Psychiatric Specialty Exam:  Presentation  General Appearance:  Appropriate for Environment  Eye Contact: Fair  Speech: Clear and Coherent  Speech Volume: Normal    Mood and Affect  Mood: fine  Affect: improving   Thought Process  Thought Processes: concrete  Orientation:Partial  Thought Content:pain focused Hallucinations: denies  Ideas of Reference:none Suicidal Thoughts: Denies  Homicidal Thoughts: Denies   Sensorium  Memory: Remote Poor; Immediate Poor  Judgment: Impaired  Insight: Shallow   Executive  Functions  Concentration: Poor  Attention Span: Poor  Recall: -- (unable to assess)  Fund of Knowledge: -- (unable to assess)  Language: fair  Psychomotor Activity  Psychomotor Activity: No data recorded  Musculoskeletal: Strength & Muscle Tone: within normal limits Gait & Station: normal Assets  Assets: Social Support    Physical Exam: Physical Exam ROS Blood pressure 122/82, pulse 90, temperature 98.4 F (36.9 C), resp. rate 18, height 5' 7.01 (1.702 m), weight 60.1 kg, SpO2 99%. Body mass index is 20.75 kg/m.  Diagnosis: Active Problems:   MDD (major depressive disorder), recurrent, severe, with psychosis (HCC) R/O Neurocognitive impairment  Treatment Plan Summary:  Safety and Monitoring:             -- Voluntary admission to inpatient psychiatric unit for safety, stabilization and treatment             -- Daily contact with patient to assess and evaluate symptoms and progress in treatment             -- Patient's case to be  discussed in multi-disciplinary team meeting             -- Observation Level: q15 minute checks             -- Vital signs:  q12 hours             -- Precautions: suicide, elopement, and assault   2. Psychiatric Diagnoses and Treatment:              Prozac  40 mg daily  risperdal  is currently at 3 mg nightly and 1 mg in the morning Depakote  ER increased to 1000 mg is added for mood stabilization -- The risks/benefits/side-effects/alternatives to this medication were discussed in detail with the patient and time was given for questions. The patient consents to medication trial.                -- Metabolic profile and EKG monitoring obtained while on an atypical antipsychotic (BMI: Lipid Panel: HbgA1c: QTc:)              -- Encouraged patient to participate in unit milieu and in scheduled group therapies                            3. Medical Issues Being Addressed:  4. Discharge Planning:   -- Social work and case management to assist with discharge planning and identification of hospital follow-up needs prior to discharge  -- Estimated LOS: 3-4 days  Allyn Foil, MD 04/09/2024, 9:27 PM

## 2024-04-09 NOTE — Progress Notes (Signed)
 " PROGRESS NOTE    [name]  FMW:[id number] DOB: 1960-05-16 DOA: 03/25/2024 PCP: Edman Marsa PARAS, DO  No chief complaint on file.   Hospital Course:  [name] 64 year old female with hypertension, chronic left foot/ankle pain, chronic hepatitis C, alcohol use disorder, recurrent major depression disorder, voluntarily admitted to the inpatient psychiatric unit on 03/25/2024 with psychosis.  Hospitalist service was consulted for lower extremity edema and swelling.  She was started on Lasix .  Subjective: No acute events overnight.  Patient reports that she has been urinating frequently on Lasix .  She reports mild improvement in her legs.  Reports they are still painful.   Objective: Vitals:   04/07/24 1916 04/08/24 0714 04/08/24 1942 04/09/24 0844  BP: 114/74 111/75 117/73 121/80  Pulse: 81 77 85 79  Resp: 14 18 18    Temp: (!) 97.4 F (36.3 C) 97.9 F (36.6 C) (!) 97.2 F (36.2 C) 98.1 F (36.7 C)  TempSrc:   Oral   SpO2: 100% 100% 100% 100%  Weight:      Height:       No intake or output data in the 24 hours ending 04/09/24 1506 Filed Weights   03/25/24 2352  Weight: 60.1 kg    Examination: General exam: Appears calm and comfortable, NAD  Respiratory system: No work of breathing, symmetric chest wall expansion Cardiovascular system: S1 & S2 heard, RRR.  Gastrointestinal system: Abdomen is nondistended, soft and nontender.  Neuro: Alert and oriented. No focal neurological deficits. Extremities: Skin is taut bilaterally, lower extremity edema present.  Assessment & Plan:  Active Problems:   MDD (major depressive disorder), recurrent, severe, with psychosis (HCC)    Lower extremity swelling - Left greater than right, edema extends from toes to knee - Has been started on Lasix , will increase dose - Echo ordered, read pending. - Doppler negative though difficult study due to level of edema - TSH: WNL - CRP: Mildly elevated.  Doubt  acute infection given no fever and no leukocytosis - Atypical antipsychotic induced peripheral edema remains on the differential, most commonly reported in patients with olanzapine , Seroquel, risperidone .  If patient remains without improvement on Lasix  and no clear etiology on echo, consider discontinuation of risperidone  - Monitor creatinine while on diuretics - Compression and elevation as tolerated  Hypertension - Well-controlled - Hold HCTZ while on Lasix   Psychosis Alcohol use disorder Recurrent major depressive disorder - Voluntarily admitted to the psychiatric unit.  Continue management per behavioral health team    Code Status: Full Code Disposition:  Per primary team  Consultants:  Treatment Team:  Consulting Physician: Ahava Kissoon, DO    Antimicrobials:  Anti-infectives (From admission, onward)    Start     Dose/Rate Route Frequency Ordered Stop   03/26/24 0000  cephALEXin  (KEFLEX ) capsule 500 mg        500 mg Oral Every 6 hours 03/25/24 2317 03/26/24 1225       Data Reviewed: I have personally reviewed following labs and imaging studies CBC: Recent Labs  Lab 04/08/24 1428  WBC 9.1  HGB 9.7*  HCT 29.1*  MCV 97.3  PLT 338   Basic Metabolic Panel: Recent Labs  Lab 04/08/24 1428  NA 137  K 3.9  CL 102  CO2 23  GLUCOSE 103*  BUN 25*  CREATININE 0.71  CALCIUM 8.8*   GFR: Estimated Creatinine Clearance: 68.3 mL/min (by C-G formula based on SCr of 0.71 mg/dL). Liver Function Tests: No results for input(s): AST, ALT, ALKPHOS,  BILITOT, PROT, ALBUMIN in the last 168 hours. CBG: No results for input(s): GLUCAP in the last 168 hours.  No results found for this or any previous visit (from the past 240 hours).   Radiology Studies: US  Venous Img Lower Bilateral (DVT) Result Date: 04/08/2024 CLINICAL DATA:  Edema for 2-3 weeks EXAM: Bilateral Lower Extremity Venous Doppler Ultrasound TECHNIQUE: Gray-scale sonography with compression,  as well as color and duplex ultrasound, were performed to evaluate the deep venous system(s) from the level of the common femoral vein through the popliteal and proximal calf veins. COMPARISON:  None available FINDINGS: VENOUS Normal compressibility of the common femoral, superficial femoral, and popliteal veins, as well as the visualized calf veins. Visualized portions of profunda femoral vein and great saphenous vein unremarkable. No filling defects to suggest DVT on grayscale or color Doppler imaging. Doppler waveforms show normal direction of venous flow, normal respiratory plasticity and response to augmentation. OTHER None. Limitations: Calf veins not well visualized due to edema. IMPRESSION: No lower extremity DVT. Electronically Signed   By: Aliene Lloyd M.D.   On: 04/08/2024 16:54    Scheduled Meds:  amLODipine   10 mg Oral Daily   divalproex   1,000 mg Oral Daily   FLUoxetine   40 mg Oral q morning   folic acid   1 mg Oral Daily   furosemide   40 mg Oral Daily   naproxen   375 mg Oral BID WC   nicotine   14 mg Transdermal Daily   pantoprazole   40 mg Oral Daily   risperiDONE   1 mg Oral q AM   risperiDONE   3 mg Oral QHS   thiamine   100 mg Oral Daily   Continuous Infusions:   LOS: 15 days  MDM: Patient is high risk for one or more organ failure.  They necessitate ongoing hospitalization for continued IV therapies and subsequent lab monitoring. Total time spent interpreting labs and vitals, reviewing the medical record, coordinating care amongst consultants and care team members, directly assessing and discussing care with the patient and/or family: 55 min  Gracemarie Skeet, DO [facility]  To contact the attending physician between 7A-7P please use Epic Chat. To contact the covering physician during after hours 7P-7A, please review Amion.  04/09/2024, 3:06 PM   *This document has been created with the assistance of dictation software. Please excuse typographical errors. *   "

## 2024-04-09 NOTE — Plan of Care (Signed)
   Problem: Education: Goal: Utilization of techniques to improve thought processes will improve Outcome: Progressing Goal: Knowledge of the prescribed therapeutic regimen will improve Outcome: Progressing

## 2024-04-09 NOTE — Progress Notes (Signed)
(  Sleep Hours) - 7.75 (Any PRNs that were needed, meds refused, or side effects to meds)- Zyprexa  for agitation, tylenol  for BLE pain; Effective (Any disturbances and when (visitation, over night)-Pt became disruptive, cursing and yelling out I need my check I've been here for 22 days and I want my money.  (Concerns raised by the patient)- None reported or observed (SI/HI/AVH)- Denies

## 2024-04-09 NOTE — Progress Notes (Signed)
 SPIRITUAL CARE AND COUNSELING CONSULT NOTE   VISIT SUMMARY Follow up visit for [facility] while rounding on unit. Chaplain consulted with nurse team.   SPIRITUAL ENCOUNTER                                                                                                                                                                      Type of Visit: Follow up Care provided to:: Patient Conversation partners present during encounter: Nurse Referral source: Patient request Reason for visit: Routine spiritual support OnCall Visit: No   SPIRITUAL FRAMEWORK  Presenting Themes: Caregiving needs, Goals in life/care Community/Connection: Family Patient Stress Factors: Health changes, Other (Comment) (leg/foot pain) Family Stress Factors: None identified   GOALS   Self/Personal Goals: healing, leg/foot pain Clinical Care Goals: healing, leg/foot pain   INTERVENTIONS   Spiritual Care Interventions Made: Established relationship of care and support, Explored ethical dilemma, Reflective listening, Normalization of emotions    INTERVENTION OUTCOMES   Outcomes: Autonomy/agency, Awareness around self/spiritual resourses, Connection to values and goals of care  Chaplain provided compassionate presence, reflective listening, and advocacy to elicit Shawntay's feelings about health status, treatment plan, and desired outcomes.   SPIRITUAL CARE PLAN   Spiritual Care Issues Still Outstanding: Chaplain will continue to follow    If immediate needs arise, please contact [facility] 24 hour on call [phone]   Barabara Chess, Chaplain  04/09/2024 2:34 PM

## 2024-04-09 NOTE — Progress Notes (Signed)
 Hat placed in patient's toilet to be able to assess urine output.

## 2024-04-09 NOTE — Group Note (Signed)
 Date:  04/09/2024 Time:  10:10 PM  Group Topic/Focus:  Goals Group:   The focus of this group is to help patients establish daily goals to achieve during treatment and discuss how the patient can incorporate goal setting into their daily lives to aide in recovery.    Participation Level:  Active  Participation Quality:  Appropriate  Affect:  Appropriate  Cognitive:  Appropriate  Insight: Good  Engagement in Group:  Engaged  Modes of Intervention:  Discussion  Additional Comments:    [name] 04/09/2024, 10:10 PM

## 2024-04-09 NOTE — Progress Notes (Signed)
*  PRELIMINARY RESULTS* Echocardiogram 2D Echocardiogram has been performed.  [name] 04/09/2024, 2:31 PM

## 2024-04-09 NOTE — Progress Notes (Signed)
 Possible discharge 1.24.26   04/09/24 1200  Psych Admission Type (Psych Patients Only)  Admission Status Voluntary  Psychosocial Assessment  Patient Complaints Irritability  Eye Contact Fair  Facial Expression Anxious;Worried  Affect Irritable;Preoccupied;Apprehensive  Speech Tangential;Argumentative  Interaction Assertive  Motor Activity Slow;Fidgety  Appearance/Hygiene In scrubs  Behavior Characteristics Irritable;Restless  Mood Preoccupied;Anxious;Suspicious;Irritable  Thought Process  Coherency Disorganized  Content Blaming others;Preoccupation  Delusions Paranoid  Perception UTA  Hallucination None reported or observed  Judgment Impaired  Confusion Mild  Danger to Self  Current suicidal ideation? Denies  Danger to Others  Danger to Others None reported or observed

## 2024-04-09 NOTE — Plan of Care (Signed)
" °  Problem: Health Behavior/Discharge Planning: Goal: Ability to make decisions will improve Outcome: Progressing Goal: Compliance with therapeutic regimen will improve Outcome: Progressing   Problem: Safety: Goal: Ability to disclose and discuss suicidal ideas will improve Outcome: Progressing Goal: Ability to identify and utilize support systems that promote safety will improve Outcome: Progressing   Problem: Education: Goal: Will be free of psychotic symptoms Outcome: Progressing   "

## 2024-04-09 NOTE — Group Note (Signed)
 Date:  04/09/2024 Time:  11:05 AM  Group Topic/Focus:  Coping With Mental Health Crisis:   The purpose of this group is to help patients identify strategies for coping with mental health crisis.  Group discusses possible causes of crisis and ways to manage them effectively.    Participation Level:  Did Not Attend   [name] 04/09/2024, 11:05 AM

## 2024-04-10 LAB — COMPREHENSIVE METABOLIC PANEL WITH GFR
ALT: 34 U/L (ref 0–44)
AST: 34 U/L (ref 15–41)
Albumin: 3.3 g/dL — ABNORMAL LOW (ref 3.5–5.0)
Alkaline Phosphatase: 93 U/L (ref 38–126)
Anion gap: 12 (ref 5–15)
BUN: 26 mg/dL — ABNORMAL HIGH (ref 8–23)
CO2: 26 mmol/L (ref 22–32)
Calcium: 9.2 mg/dL (ref 8.9–10.3)
Chloride: 102 mmol/L (ref 98–111)
Creatinine, Ser: 0.76 mg/dL (ref 0.44–1.00)
GFR, Estimated: >60 mL/min
Glucose, Bld: 91 mg/dL (ref 70–99)
Potassium: 3.9 mmol/L (ref 3.5–5.1)
Sodium: 139 mmol/L (ref 135–145)
Total Bilirubin: 0.2 mg/dL (ref 0.0–1.2)
Total Protein: 7 g/dL (ref 6.5–8.1)

## 2024-04-10 NOTE — Progress Notes (Signed)
 Patient was discharged from [facility] unit escorted by staff. Patient denies SI/HI/AVH. Discharge packet to include printed AVS, Suicide Risk Assessment,Transition Record, and outpatient medications reviewed with patient. Belongings to include cell phone and clothes returned. Also see belongings sheet. Patient declined to complete a Suicide Safety Plan or patient survey.   Continued to educate patient on feet elevation due to bilat lower extremity swelling. Patient verbalized understanding.

## 2024-04-10 NOTE — Plan of Care (Signed)
 " Problem: Education: Goal: Ability to state activities that reduce stress will improve Outcome: Adequate for Discharge   Problem: Coping: Goal: Ability to identify and develop effective coping behavior will improve Outcome: Adequate for Discharge   Problem: Self-Concept: Goal: Ability to identify factors that promote anxiety will improve Outcome: Adequate for Discharge Goal: Level of anxiety will decrease Outcome: Adequate for Discharge Goal: Ability to modify response to factors that promote anxiety will improve Outcome: Adequate for Discharge   Problem: Education: Goal: Utilization of techniques to improve thought processes will improve Outcome: Adequate for Discharge Goal: Knowledge of the prescribed therapeutic regimen will improve Outcome: Adequate for Discharge   Problem: Activity: Goal: Interest or engagement in leisure activities will improve Outcome: Adequate for Discharge Goal: Imbalance in normal sleep/wake cycle will improve Outcome: Adequate for Discharge   Problem: Coping: Goal: Coping ability will improve Outcome: Adequate for Discharge Goal: Will verbalize feelings Outcome: Adequate for Discharge   Problem: Health Behavior/Discharge Planning: Goal: Ability to make decisions will improve Outcome: Adequate for Discharge Goal: Compliance with therapeutic regimen will improve Outcome: Adequate for Discharge   Problem: Role Relationship: Goal: Will demonstrate positive changes in social behaviors and relationships Outcome: Adequate for Discharge   Problem: Safety: Goal: Ability to disclose and discuss suicidal ideas will improve Outcome: Adequate for Discharge Goal: Ability to identify and utilize support systems that promote safety will improve Outcome: Adequate for Discharge   Problem: Self-Concept: Goal: Will verbalize positive feelings about self Outcome: Adequate for Discharge Goal: Level of anxiety will decrease Outcome: Adequate for Discharge    Problem: Education: Goal: Knowledge of [facility] Education information/materials will improve Outcome: Adequate for Discharge Goal: Emotional status will improve Outcome: Adequate for Discharge Goal: Mental status will improve Outcome: Adequate for Discharge Goal: Verbalization of understanding the information provided will improve Outcome: Adequate for Discharge   Problem: Activity: Goal: Interest or engagement in activities will improve Outcome: Adequate for Discharge Goal: Sleeping patterns will improve Outcome: Adequate for Discharge   Problem: Coping: Goal: Ability to verbalize frustrations and anger appropriately will improve Outcome: Adequate for Discharge Goal: Ability to demonstrate self-control will improve Outcome: Adequate for Discharge   Problem: Health Behavior/Discharge Planning: Goal: Identification of resources available to assist in meeting health care needs will improve Outcome: Adequate for Discharge Goal: Compliance with treatment plan for underlying cause of condition will improve Outcome: Adequate for Discharge   Problem: Physical Regulation: Goal: Ability to maintain clinical measurements within normal limits will improve Outcome: Adequate for Discharge   Problem: Safety: Goal: Periods of time without injury will increase Outcome: Adequate for Discharge   Problem: Education: Goal: Knowledge of General Education information will improve Description: Including pain rating scale, medication(s)/side effects and non-pharmacologic comfort measures Outcome: Adequate for Discharge   Problem: Health Behavior/Discharge Planning: Goal: Ability to manage health-related needs will improve Outcome: Adequate for Discharge   Problem: Clinical Measurements: Goal: Ability to maintain clinical measurements within normal limits will improve Outcome: Adequate for Discharge Goal: Will remain free from infection Outcome: Adequate for Discharge Goal:  Diagnostic test results will improve Outcome: Adequate for Discharge Goal: Respiratory complications will improve Outcome: Adequate for Discharge Goal: Cardiovascular complication will be avoided Outcome: Adequate for Discharge   Problem: Activity: Goal: Risk for activity intolerance will decrease Outcome: Adequate for Discharge   Problem: Nutrition: Goal: Adequate nutrition will be maintained Outcome: Adequate for Discharge   Problem: Coping: Goal: Level of anxiety will decrease Outcome: Adequate for Discharge   Problem: Elimination:  Goal: Will not experience complications related to bowel motility Outcome: Adequate for Discharge Goal: Will not experience complications related to urinary retention Outcome: Adequate for Discharge   Problem: Pain Managment: Goal: General experience of comfort will improve and/or be controlled Outcome: Adequate for Discharge   Problem: Safety: Goal: Ability to remain free from injury will improve Outcome: Adequate for Discharge   Problem: Skin Integrity: Goal: Risk for impaired skin integrity will decrease Outcome: Adequate for Discharge   Problem: Education: Goal: Will be free of psychotic symptoms Outcome: Adequate for Discharge   "

## 2024-04-10 NOTE — Plan of Care (Signed)
" °  Problem: Coping: Goal: Will verbalize feelings Outcome: Progressing   Problem: Education: Goal: Mental status will improve Outcome: Progressing   "

## 2024-04-10 NOTE — Group Note (Signed)
 Date:  04/10/2024 Time:  10:04 AM  Group Topic/Focus:  Movement Therapy, Morning Stretch with Payge Eppes.    Participation Level:  Active  Participation Quality:  Attentive  Affect:  Appropriate  Cognitive:  Appropriate  Insight: Improving  Engagement in Group:  Improving  Modes of Intervention:  Activity  Additional Comments:  none  Norleen SHAUNNA Bias 04/10/2024, 10:04 AM

## 2024-04-10 NOTE — [facility] Counselor (Signed)
 CSW spoke with Alla Sloma, daughter, [phone] to confirm discharge. Pt's daughter is aware and willing to have pt return to her home at discharge. Pt's daughter requesting assistance with transportation. CSW will provide transportation voucher.

## 2024-04-10 NOTE — [facility] Suicide Risk Assessment (Signed)
 [facility] Discharge Suicide Risk Assessment   Principal Problem: <principal problem not specified> Discharge Diagnoses: Active Problems:   MDD (major depressive disorder), recurrent, severe, with psychosis (HCC)   Total Time spent with patient: 30 minutes  Musculoskeletal: Strength & Muscle Tone: within normal limits Gait & Station: normal Patient leans: N/A  Psychiatric Specialty Exam  Presentation  General Appearance:  Appropriate for Environment  Eye Contact: Fair  Speech: Normal Rate  Speech Volume: Decreased  Handedness:No data recorded  Mood and Affect  Mood: Euthymic  Duration of Depression Symptoms: Greater than two weeks  Affect: Appropriate   Thought Process  Thought Processes: Coherent  Descriptions of Associations:Intact  Orientation:Partial  Thought Content:Logical  History of Schizophrenia/Schizoaffective disorder:No  Duration of Psychotic Symptoms:No data recorded Hallucinations:Hallucinations: None  Ideas of Reference:None  Suicidal Thoughts:Suicidal Thoughts: No  Homicidal Thoughts:Homicidal Thoughts: No   Sensorium  Memory: Immediate Fair; Recent Fair  Judgment: Fair  Insight: Fair   Art Therapist  Concentration: Fair  Attention Span: Fair  Recall: Fair  Fund of Knowledge: Fair  Language: Fair   Psychomotor Activity  Psychomotor Activity: Psychomotor Activity: Normal   Assets  Assets: Physical Health   Sleep  Sleep: Sleep: Fair  Estimated Sleeping Duration (Last 24 Hours): 1.75-2.25 hours  Physical Exam: Physical Exam ROS Blood pressure (!) 144/87, pulse (!) 101, temperature 98 F (36.7 C), resp. rate 17, height 5' 7.01 (1.702 m), weight 60.1 kg, SpO2 100%. Body mass index is 20.75 kg/m.  Mental Status Per Nursing Assessment::   On Admission:  NA  Demographic Factors:  Low socioeconomic status  Loss Factors: Decrease in vocational status  Historical  Factors: Impulsivity  Risk Reduction Factors:   Living with another person, especially a relative, Positive social support, Positive therapeutic relationship, and Positive coping skills or problem solving skills  Continued Clinical Symptoms:  Depression:   Comorbid alcohol abuse/dependence  Cognitive Features That Contribute To Risk:  None    Suicide Risk:  Minimal: No identifiable suicidal ideation.  Patients presenting with no risk factors but with morbid ruminations; may be classified as minimal risk based on the severity of the depressive symptoms   Follow-up Information     [facility]. Schedule an appointment as soon as possible for a visit in 1 week(s).   Contact information: [address] [place] [address] [phone]                 Plan Of Care/Follow-up recommendations:  Activity:  as tolerated  Allyn Foil, MD 04/10/2024, 11:02 AM

## 2024-04-10 NOTE — Discharge Summary (Signed)
 " Physician Discharge Summary Note  Patient:  [name] is an 64 y.o., female MRN:  [id number] DOB:  10-Mar-1961 Patient phone:  [phone] (home)  Patient address:   [address] [name] [address],   Total time spent: 40 min Date of Admission:  03/25/2024 Date of Discharge: 04/10/24  Reason for Admission:  64 y.o. female with past medical history of HTN, hx traumatic L ankle fracture with chronic left foot/ankle pain, chronic hepatitis C (unclear treatment status),  alcohol use disorder, recurrent major depressive disorder, voluntarily admitted to in the inpatient psychiatric unit on January 8th, 2026 with psychosis. Patient is admitted to  psych unit with Q15 min safety monitoring. Multidisciplinary team approach is offered. Medication management; group/milieu therapy is offered.   Principal Problem: <principal problem not specified> Discharge Diagnoses: Active Problems:   MDD (major depressive disorder), recurrent, severe, with psychosis (HCC)   Past Psychiatric History: see h&p  Family Psychiatric  History: see h&p Social History:  Social History   Substance and Sexual Activity  Alcohol Use Yes   Comment: 3-4 40 oz beers per week - occasionally     Social History   Substance and Sexual Activity  Drug Use No    Social History   Socioeconomic History   Marital status: Widowed    Spouse name: Not on file   Number of children: Not on file   Years of education: Not on file   Highest education level: High school graduate  Occupational History   Not on file  Tobacco Use   Smoking status: Every Day    Current packs/day: 0.33    Types: Cigarettes   Smokeless tobacco: Never  Vaping Use   Vaping status: Never Used  Substance and Sexual Activity   Alcohol use: Yes    Comment: 3-4 40 oz beers per week - occasionally   Drug use: No   Sexual activity: Not Currently  Other Topics Concern   Not on file  Social History Narrative   Not on file    Social Drivers of Health   Tobacco Use: High Risk (03/25/2024)   Patient History    Smoking Tobacco Use: Every Day    Smokeless Tobacco Use: Never    Passive Exposure: Not on file  Financial Resource Strain: Not on file  Food Insecurity: No Food Insecurity (03/26/2024)   Epic    Worried About Programme Researcher, Broadcasting/film/video in the Last Year: Never true    Ran Out of Food in the Last Year: Never true  Transportation Needs: No Transportation Needs (03/26/2024)   Epic    Lack of Transportation (Medical): No    Lack of Transportation (Non-Medical): No  Physical Activity: Not on file  Stress: Not on file  Social Connections: Not on file  Depression (PHQ2-9): Medium Risk (11/24/2023)   Depression (PHQ2-9)    PHQ-2 Score: 5  Alcohol Screen: Low Risk (03/29/2024)   Alcohol Screen    Last Alcohol Screening Score (AUDIT): 0  Housing: Low Risk (03/26/2024)   Epic    Unable to Pay for Housing in the Last Year: No    Number of Times Moved in the Last Year: 0    Homeless in the Last Year: No  Utilities: Not At Risk (03/26/2024)   Epic    Threatened with loss of utilities: No  Health Literacy: Not on file   Past Medical History:  Past Medical History:  Diagnosis Date   Depression    Hypertension  Past Surgical History:  Procedure Laterality Date   BRAIN SURGERY     BREAST BIOPSY Left 2011   CORE W/CLIP   EYE SURGERY Bilateral    glaucoma   TUBAL LIGATION     Family History: History reviewed. No pertinent family history.  Hospital Course:  64 y.o. female with past medical history of HTN, hx traumatic L ankle fracture with chronic left foot/ankle pain, chronic hepatitis C (unclear treatment status),  alcohol use disorder, recurrent major depressive disorder, voluntarily admitted to in the inpatient psychiatric unit on January 8th, 2026 with psychosis. Patient is admitted to  psych unit with Q15 min safety monitoring. Multidisciplinary team approach is offered. Medication management; group/milieu  therapy is offered.   On admission,  Detailed risk assessment is complete based on clinical exam and individual risk factors and acute suicide risk is low and acute violence risk is low.    On the day of discharge, patient denies SI/HI/plan and denies hallucinations.  Patient remains future oriented and is willing to participate in outpatient mental health services.  Currently, all modifiable risk of harm to self/harm to others have been addressed and patient is no longer appropriate for the acute inpatient setting and is able to continue treatment for mental health needs in the community with the supports as indicated below.  Patient is educated and verbalized understanding of discharge plan of care including medications, follow-up appointments, mental health resources and further crisis services in the community.  He is instructed to call 911 or present to the nearest emergency room should he experience any decompensation in mood, disturbance of bowel or return of suicidal/homicidal ideations.  Patient verbalizes understanding of this education and agrees to this plan of care  Physical Findings: AIMS:  , ,  ,  ,    CIWA:    COWS:      Psychiatric Specialty Exam:  Presentation  General Appearance:  Appropriate for Environment  Eye Contact: Fair  Speech: Normal Rate  Speech Volume: Decreased    Mood and Affect  Mood: Euthymic  Affect: Appropriate   Thought Process  Thought Processes: Coherent  Descriptions of Associations:Intact  Orientation:Partial  Thought Content:Logical  Hallucinations:Hallucinations: None  Ideas of Reference:None  Suicidal Thoughts:Suicidal Thoughts: No  Homicidal Thoughts:Homicidal Thoughts: No   Sensorium  Memory: Immediate Fair; Recent Fair  Judgment: Fair  Insight: Fair   Art Therapist  Concentration: Fair  Attention Span: Fair  Recall: [employer] of Knowledge: Fair  Language: Fair   Psychomotor  Activity  Psychomotor Activity: Psychomotor Activity: Normal  Musculoskeletal: Strength & Muscle Tone: {desc; muscle tone:32375} Gait & Station: {PE GAIT ED NATL:22525} Assets  Assets: Physical Health   Sleep  Sleep: Sleep: Fair    Physical Exam: Physical Exam ROS Blood pressure (!) 144/87, pulse (!) 101, temperature 98 F (36.7 C), resp. rate 17, height 5' 7.01 (1.702 m), weight 60.1 kg, SpO2 100%. Body mass index is 20.75 kg/m.   Tobacco Use History[1] Tobacco Cessation:  {Discharge tobacco cessation prescription:304700209}   Blood Alcohol level:  Lab Results  Component Value Date   [facility] <15 03/25/2024   ETH <10 03/03/2020    Metabolic Disorder Labs:  Lab Results  Component Value Date   HGBA1C 5.4 11/24/2023   MPG 108 11/24/2023   No results found for: PROLACTIN Lab Results  Component Value Date   CHOL 242 (H) 11/24/2023   TRIG 97 11/24/2023   HDL 69 11/24/2023   CHOLHDL 3.5 11/24/2023   LDLCALC 152 (H)  11/24/2023    See Psychiatric Specialty Exam and Suicide Risk Assessment completed by Attending Physician prior to discharge.  Discharge destination:  Home  Is patient on multiple antipsychotic therapies at discharge:  No   Has Patient had three or more failed trials of antipsychotic monotherapy by history:  No  Recommended Plan for Multiple Antipsychotic Therapies: NA  Discharge Instructions     Increase activity slowly   Complete by: As directed       Allergies as of 04/10/2024       Reactions   Lisinopril  Swelling        Medication List     TAKE these medications      Indication  albuterol  (2.5 MG/3ML) 0.083% nebulizer solution Commonly known as: PROVENTIL  Inhale 3 mLs into the lungs every 4 (four) hours as needed for wheezing or shortness of breath.    amLODipine  10 MG tablet Commonly known as: NORVASC  Take 1 tablet (10 mg total) by mouth daily. What changed:  medication strength how much to take  Indication: High  Blood Pressure   divalproex  500 MG 24 hr tablet Commonly known as: DEPAKOTE  ER Take 2 tablets (1,000 mg total) by mouth daily.  Indication: Manic Phase of Manic-Depression   FLUoxetine  40 MG capsule Commonly known as: PROZAC  Take 1 capsule (40 mg total) by mouth once daily every morning.    folic acid  1 MG tablet Commonly known as: FOLVITE  Take 1 tablet (1 mg total) by mouth daily.    furosemide  40 MG tablet Commonly known as: LASIX  Take 1 tablet (40 mg total) by mouth 2 (two) times daily.  Indication: Edema   hydrochlorothiazide  25 MG tablet Commonly known as: HYDRODIURIL  Take 1 tablet (25 mg total) by mouth daily.    meloxicam  15 MG tablet Commonly known as: MOBIC  Take 1 tablet (15 mg total) by mouth daily as needed for pain.    naproxen  375 MG tablet Commonly known as: NAPROSYN  Take 1 tablet (375 mg total) by mouth 2 (two) times daily with a meal.  Indication: Pain   nicotine  14 mg/24hr patch Commonly known as: NICODERM CQ  - dosed in mg/24 hours Place 1 patch (14 mg total) onto the skin daily.    omeprazole  40 MG capsule Commonly known as: PRILOSEC Take 1 capsule (40 mg total) by mouth daily before breakfast.    ondansetron  4 MG disintegrating tablet Commonly known as: ZOFRAN -ODT Take 1 tablet (4 mg total) by mouth every 8 (eight) hours as needed for nausea or vomiting.    risperiDONE  3 MG tablet Commonly known as: RISPERDAL  Take 1 tablet (3 mg total) by mouth at bedtime.  Indication: Hypomanic Episode of Bipolar Disorder   risperiDONE  1 MG tablet Commonly known as: RISPERDAL  Take 1 tablet (1 mg total) by mouth in the morning.  Indication: Hypomanic Episode of Bipolar Disorder   Sofosbuvir -Velpatasvir  400-100 MG Tabs Commonly known as: Epclusa  Take 1 tablet by mouth daily.  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  Indication:  Deficiency of Vitamin B1        Follow-up Information     [facility], [facility]. Schedule an appointment as soon as possible for a visit in 1 week(s).   Contact information: [address] [place] [address] [phone]                 Follow-up recommendations:  Activity:  As tolerated    Signed: Deasiah Hagberg, MD 04/10/2024, 11:04 AM          [1]  Social History Tobacco Use  Smoking Status Every Day   Current packs/day: 0.33   Types: Cigarettes  Smokeless Tobacco Never   "

## 2024-04-10 NOTE — Progress Notes (Signed)
" °   04/10/24 0420  Psych Admission Type (Psych Patients Only)  Admission Status Voluntary  Psychosocial Assessment  Patient Complaints Irritability  Eye Contact Fair  Facial Expression Anxious;Worried  Affect Preoccupied;Apprehensive;Irritable  Speech Argumentative;Tangential  Interaction Assertive  Motor Activity Slow;Fidgety  Appearance/Hygiene Layered clothes  Behavior Characteristics Irritable;Restless  Mood Anxious;Irritable;Suspicious  Thought Process  Coherency Disorganized  Content Preoccupation  Delusions Paranoid  Perception UTA  Hallucination None reported or observed  Judgment Impaired  Confusion Mild  Danger to Self  Current suicidal ideation? Denies  Agreement Not to Harm Self Yes  Description of Agreement verbal  Danger to Others  Danger to Others None reported or observed    "

## 2024-04-10 NOTE — Progress Notes (Signed)
" °  [facility] Adult Case Management Discharge Plan :  Will you be returning to the same living situation after discharge:  Yes,  Pt returning to daughter's home. At discharge, do you have transportation home?: No. Do you have the ability to pay for your medications: Yes,     Release of information consent forms completed and in the chart;  Patient's signature needed at discharge.  Patient to Follow up at:  Follow-up Information     [facility], [facility]. Schedule an appointment as soon as possible for a visit in 1 week(s).   Contact information: [address] [place] [address] [phone]                 Next level of care provider has access to [facility] Link:yes  Safety Planning and Suicide Prevention discussed: Yes,  SPE completed with Bellany Elbaum, daughter, [phone]  on 03/26/2024     Has patient been referred to the Quitline?: Patient does not use tobacco/nicotine  products  Patient has been referred for addiction treatment: No known substance use disorder.  Aldo HERO Wava Kildow, LCSW 04/10/2024, 10:27 AM "

## 2024-04-10 NOTE — Progress Notes (Signed)
 " PROGRESS NOTE    [name]  FMW:[id number] DOB: 1960-10-16 DOA: 03/25/2024 PCP: [name] Marsa PARAS, DO  No chief complaint on file.   Moreno Course:  [name] 64 year old female with hypertension, chronic left foot/ankle pain, chronic hepatitis C, alcohol use disorder, recurrent major depression disorder, voluntarily admitted to the inpatient psychiatric unit on 03/25/2024 with psychosis.  Hospitalist service was consulted for lower extremity edema and swelling.  She was started on Lasix .  Subjective: On evaluation this morning patient reports she has been urinating frequently, she believes her legs may be mildly improved.  I reiterated the importance of leg elevation.  Objective: Vitals:   04/08/24 1942 04/09/24 0844 04/09/24 1927 04/10/24 0710  BP: 117/73 121/80 122/82 (!) 144/87  Pulse: 85 79 90 (!) 101  Resp: 18   17  Temp: (!) 97.2 F (36.2 C) 98.1 F (36.7 C) 98.4 F (36.9 C) 98 F (36.7 C)  TempSrc: Oral     SpO2: 100% 100% 99% 100%  Weight:      Height:        Intake/Output Summary (Last 24 hours) at 04/10/2024 1524 Last data filed at 04/10/2024 0408 Gross per 24 hour  Intake --  Output 250 ml  Net -250 ml   Filed Weights   03/25/24 2352  Weight: 60.1 kg    Examination: General exam: Appears calm and comfortable, NAD  Respiratory system: No work of breathing, symmetric chest wall expansion Cardiovascular system: S1 & S2 heard, RRR.  Gastrointestinal system: Abdomen is nondistended, soft and nontender.  Neuro: Alert and oriented. No focal neurological deficits. Extremities: Skin is taut bilaterally, lower extremity edema present, tender to palpation bilaterally  Assessment & Plan:  Active Problems:   MDD (major depressive disorder), recurrent, severe, with psychosis (HCC)    Lower extremity swelling - Left greater than right, edema extends from toes to knee - Started on Lasix , dose increased on 1/24.  Perhaps modest  improvement - Echocardiogram: LVEF 60 to 65%, no RWMA, grade 1 diastolic dysfunction.  No significant valvular dysfunction.  Mild left atrial dilation. - Doppler negative though difficult study due to level of edema - TSH: WNL - CRP: Mildly elevated.  Doubt acute infection given no fever and no leukocytosis - Atypical antipsychotic induced peripheral edema remains on the differential, most commonly reported in patients with olanzapine , Seroquel, risperidone .  If patient remains without improvement on Lasix  consider discontinuation of risperidone  - Monitor creatinine while on diuretics - Would benefit from outpatient follow-up with cardiology for continued evaluation.  At minimum should see PCP within 1 week of discharge - Compression and elevation as tolerated  Hypertension - Well-controlled - Hold HCTZ while on Lasix   Psychosis Alcohol use disorder Recurrent major depressive disorder - Voluntarily admitted to the psychiatric unit.  Continue management per behavioral health team    Code Status: Full Code Disposition:  Per primary team  Consultants:  Treatment Team:  Consulting Physician: [name] Blasingame, DO    Antimicrobials:  Anti-infectives (From admission, onward)    Start     Dose/Rate Route Frequency Ordered Stop   03/26/24 0000  cephALEXin  (KEFLEX ) capsule 500 mg        500 mg Oral Every 6 hours 03/25/24 2317 03/26/24 1225       Data Reviewed: I have personally reviewed following labs and imaging studies CBC: Recent Labs  Lab 04/08/24 1428  WBC 9.1  HGB 9.7*  HCT 29.1*  MCV 97.3  PLT 338   Basic Metabolic  Panel: Recent Labs  Lab 04/08/24 1428 04/10/24 0950  NA 137 139  K 3.9 3.9  CL 102 102  CO2 23 26  GLUCOSE 103* 91  BUN 25* 26*  CREATININE 0.71 0.76  CALCIUM 8.8* 9.2   GFR: Estimated Creatinine Clearance: 68.3 mL/min (by C-G formula based on SCr of 0.76 mg/dL). Liver Function Tests: Recent Labs  Lab 04/10/24 0950  AST 34  ALT 34   ALKPHOS 93  BILITOT 0.2  PROT 7.0  ALBUMIN 3.3*   CBG: No results for input(s): GLUCAP in the last 168 hours.  No results found for this or any previous visit (from the past 240 hours).   Radiology Studies: ECHOCARDIOGRAM COMPLETE Result Date: 04/09/2024    ECHOCARDIOGRAM REPORT   Patient Name:   [name] [facility] Date of Exam: 04/09/2024 Medical Rec #:  [id number]               Height:       67.0 in Accession #:    [id number]              Weight:       132.5 lb Date of Birth:  Mar 23, 1960               BSA:          1.698 m Patient Age:    63 years                BP:           121/80 mmHg Patient Gender: F                       HR:           79 bpm. Exam Location:  [facility] Procedure: 2D Echo, Cardiac Doppler, Color Doppler, 3D Echo and Strain Analysis            (Both Spectral and Color Flow Doppler were utilized during            procedure). Indications:     Dyspnea R06.00  History:         Patient has no prior history of Echocardiogram examinations.                  Risk Factors:Hypertension.  Sonographer:     [name] Referring Phys:  [name] [name] Diagnosing Phys: [name]  Sonographer Comments: Global longitudinal strain was attempted. IMPRESSIONS  1. Left ventricular ejection fraction, by estimation, is 60 to 65%. The left ventricle has normal function. The left ventricle has no regional wall motion abnormalities. Left ventricular diastolic parameters are consistent with Grade I diastolic dysfunction (impaired relaxation).  2. Right ventricular systolic function is normal. The right ventricular size is normal.  3. Left atrial size was mildly dilated.  4. The mitral valve is normal in structure. No evidence of mitral valve regurgitation.  5. The aortic valve is normal in structure. Aortic valve regurgitation is not visualized.  6. The inferior vena cava is normal in size with greater than 50% respiratory variability, suggesting right atrial pressure of 3 mmHg. FINDINGS   Left Ventricle: Left ventricular ejection fraction, by estimation, is 60 to 65%. The left ventricle has normal function. The left ventricle has no regional wall motion abnormalities. Global longitudinal strain performed but not reported based on interpreter judgement due to suboptimal tracking. The left ventricular internal cavity size was normal in size. There is no left ventricular hypertrophy. Left ventricular  diastolic parameters are consistent with Grade I diastolic dysfunction (impaired relaxation). Right Ventricle: The right ventricular size is normal. No increase in right ventricular wall thickness. Right ventricular systolic function is normal. Left Atrium: Left atrial size was mildly dilated. Right Atrium: Right atrial size was normal in size. Pericardium: There is no evidence of pericardial effusion. Mitral Valve: The mitral valve is normal in structure. No evidence of mitral valve regurgitation. MV peak gradient, 5.3 mmHg. The mean mitral valve gradient is 3.0 mmHg. Tricuspid Valve: The tricuspid valve is normal in structure. Tricuspid valve regurgitation is not demonstrated. Aortic Valve: The aortic valve is normal in structure. Aortic valve regurgitation is not visualized. Aortic valve mean gradient measures 7.0 mmHg. Aortic valve peak gradient measures 13.5 mmHg. Aortic valve area, by VTI measures 3.83 cm. Pulmonic Valve: The pulmonic valve was not well visualized. Pulmonic valve regurgitation is not visualized. Aorta: The aortic root is normal in size and structure. Venous: The inferior vena cava is normal in size with greater than 50% respiratory variability, suggesting right atrial pressure of 3 mmHg. IAS/Shunts: No atrial level shunt detected by color flow Doppler.  LEFT VENTRICLE PLAX 2D LVIDd:         4.60 cm   Diastology LVIDs:         2.70 cm   LV e' medial:    6.85 cm/s LV PW:         1.10 cm   LV E/e' medial:  12.1 LV IVS:        1.00 cm   LV e' lateral:   7.83 cm/s LVOT diam:     2.30 cm    LV E/e' lateral: 10.6 LV SV:         123 LV SV Index:   73 LVOT Area:     4.15 cm LV IVRT:       85 msec  RIGHT VENTRICLE RV Basal diam:  3.20 cm RV Mid diam:    2.90 cm RV S prime:     12.40 cm/s TAPSE (M-mode): 3.3 cm LEFT ATRIUM             Index        RIGHT ATRIUM           Index LA diam:        2.30 cm 1.35 cm/m   RA Area:     12.40 cm LA Vol (A2C):   66.5 ml 39.17 ml/m  RA Volume:   26.30 ml  15.49 ml/m LA Vol (A4C):   65.0 ml 38.29 ml/m LA Biplane Vol: 65.1 ml 38.35 ml/m  AORTIC VALVE AV Area (Vmax):    3.64 cm AV Area (Vmean):   3.48 cm AV Area (VTI):     3.83 cm AV Vmax:           184.00 cm/s AV Vmean:          124.000 cm/s AV VTI:            0.322 m AV Peak Grad:      13.5 mmHg AV Mean Grad:      7.0 mmHg LVOT Vmax:         161.00 cm/s LVOT Vmean:        104.000 cm/s LVOT VTI:          0.297 m LVOT/AV VTI ratio: 0.92  AORTA Ao Root diam: 2.90 cm MITRAL VALVE               TRICUSPID VALVE  MV Area (PHT): 3.74 cm    TR Peak grad:   6.8 mmHg MV Area VTI:   4.69 cm    TR Vmax:        130.00 cm/s MV Peak grad:  5.3 mmHg MV Mean grad:  3.0 mmHg    SHUNTS MV Vmax:       1.15 m/s    Systemic VTI:  0.30 m MV Vmean:      80.9 cm/s   Systemic Diam: 2.30 cm MV Decel Time: 203 msec MV E velocity: 82.80 cm/s MV A velocity: 98.00 cm/s MV E/A ratio:  0.84 [name] Electronically signed by [name] Signature Date/Time: 04/09/2024/5:56:56 PM    Final    US  Venous Img Lower Bilateral (DVT) Result Date: 04/08/2024 CLINICAL DATA:  Edema for 2-3 weeks EXAM: Bilateral Lower Extremity Venous Doppler Ultrasound TECHNIQUE: Gray-scale sonography with compression, as well as color and duplex ultrasound, were performed to evaluate the deep venous system(s) from the level of the common femoral vein through the popliteal and proximal calf veins. COMPARISON:  None available FINDINGS: VENOUS Normal compressibility of the common femoral, superficial femoral, and popliteal veins, as well as the  visualized calf veins. Visualized portions of profunda femoral vein and great saphenous vein unremarkable. No filling defects to suggest DVT on grayscale or color Doppler imaging. Doppler waveforms show normal direction of venous flow, normal respiratory plasticity and response to augmentation. OTHER None. Limitations: Calf veins not well visualized due to edema. IMPRESSION: No lower extremity DVT. Electronically Signed   By: Aliene Lloyd M.D.   On: 04/08/2024 16:54    Scheduled Meds:  amLODipine   10 mg Oral Daily   divalproex   1,000 mg Oral Daily   FLUoxetine   40 mg Oral q morning   folic acid   1 mg Oral Daily   furosemide   40 mg Oral BID   naproxen   375 mg Oral BID WC   nicotine   14 mg Transdermal Daily   pantoprazole   40 mg Oral Daily   risperiDONE   1 mg Oral q AM   risperiDONE   3 mg Oral QHS   thiamine   100 mg Oral Daily   Continuous Infusions:   LOS: 16 days  MDM: Patient is high risk for one or more organ failure.  They necessitate ongoing hospitalization for continued IV therapies and subsequent lab monitoring. Total time spent interpreting labs and vitals, reviewing the medical record, coordinating care amongst consultants and care team members, directly assessing and discussing care with the patient and/or family: 55 min  Delonda Coley, DO [facility]  To contact the attending physician between 7A-7P please use Epic Chat. To contact the covering physician during after hours 7P-7A, please review Amion.  04/10/2024, 3:24 PM   *This document has been created with the assistance of dictation software. Please excuse typographical errors. *   "

## 2024-04-13 ENCOUNTER — Ambulatory Visit: Payer: Self-pay

## 2024-04-13 ENCOUNTER — Telehealth: Payer: Self-pay

## 2024-04-13 DIAGNOSIS — Z87898 Personal history of other specified conditions: Secondary | ICD-10-CM

## 2024-04-13 DIAGNOSIS — F333 Major depressive disorder, recurrent, severe with psychotic symptoms: Secondary | ICD-10-CM

## 2024-04-13 DIAGNOSIS — F1024 Alcohol dependence with alcohol-induced mood disorder: Secondary | ICD-10-CM | POA: Insufficient documentation

## 2024-04-13 NOTE — Telephone Encounter (Signed)
 Tried calling Daughter, her Voicemail box is not set up.

## 2024-04-13 NOTE — Telephone Encounter (Signed)
 FYI Only or Action Required?: Action required by provider: clinical question for provider.  Patient was last seen in primary care on 11/24/2023 by Edman Marsa PARAS, DO.  Called Nurse Triage reporting Alcohol Problem.  Symptoms began ongoing concern.  Interventions attempted: Nothing.  Symptoms are: gradually worsening.  Triage Disposition: See PCP When Office is Open (Within 3 Days) (overriding Call PCP Within 24 Hours)  Patient/caregiver understands and will follow disposition?: Yes        Reason for Disposition  Requesting admission for alcohol use, addiction, or withdrawal    Daughter wants patient to be admitted to a rehab program that is longer than 1 week  Answer Assessment - Initial Assessment Questions Patients daughter [name]) reports patient went to ER sheriff had to pick up from boyfriends house. Police brought to ER stayed behavioral health unit about 1 week. She just got released Thursday 1/8 . Given her medicine everyday but she wants to continue drinking. She bought alcohol home after released from unit. Last time she drank 10-20 mins. Drinking bahama mama . wine cooler. Feet are swollen. Trying to encourage her to stop drinking. Setting barriers to stop the drinking.  No seizure like behavior. Behavioral health wanted patient to follow up with PCP. Looking for long term care in rehab for her. She has been drinking since teens. Worried not able to manage at home. Wants to gt get rehab longer.  Does not want state involved.  Wants PCP input (not able to find follow up with PCP until 2/8 daughter preferred to not scheduled and have message sent as well)   1. ALCOHOL USE: Do you drink alcohol, including beer, wine or hard liquor?     Per daughter patient  is drinking  2. HOW OFTEN: How many days per week do you typically drink alcohol?     Daily , bahama mama 8oz today , not hard liqour but drinking despite the medication she is on   6. DRINKING  PROBLEM: Do you have or have you ever had an alcohol drinking problem?     Per daughter has had drinking issue since in teens  7. DRUG PROBLEM: Are you using any other drugs? (e.g., yes/no; cocaine, prescription medicines, etc.)     Denies  8. SYMPTOMS: What symptoms are you currently experiencing? (e.g., none, tremors, or shakiness, abdomen pain, vomiting, blackout spells)     Denies    Patients daughter denies patient with chest pain, shortness of breath, fever, abdominal pain , SI/HI   9. TREATMENT PROGRAM: Have you ever gone through an alcohol use treatment program?     Yes went to behavioral health unit released 1/8   11. SUPPORT: Who is with you now? Who do you live with? Do you have family or friends who you can talk to? Are you a member of Alcoholics Anonymous?       Daughter  Protocols used: Alcohol Use and Problems-A-AH  Message from [place] C sent at 04/13/2024 12:50 PM EST  Summary: alcohol abuse   Reason for Triage: pts daughter called and stated that her mother is still drinking alcohol and just got out of the hosp on Friday and she is taking medicine that she is suppose to take. She is manic depressant. The mother is out of control and she isnt sure what to do?

## 2024-04-13 NOTE — Telephone Encounter (Signed)
 I understand the concerns. Alcohol dependence treatment and rehab to detox programs or inpatient treatment facilities is not a simple process. Typically in my experience it is patient driven, most locations involve self referral from patients / family members. It may or may not be covered by insurance. If a location is found that requires referral we can work on submitting it but first she should connect with our Clinical Social Work team through [facility] I have routed the note to her and also with our case management team Georgia  Mertel RN for further assessment on how to get her access to help for this substance problem. This will need coordination of behavioral health as well. The hospital likely setup some follow-up with a provider after discharge.  Marsa Officer, DO [facility] 04/13/2024, 1:32 PM

## 2024-04-13 NOTE — Progress Notes (Signed)
 Complex Care Management Note  Care Guide Note 04/13/2024 Name: Takelia Urieta MRN: [id number] DOB: 05/06/1960  [name] is a 64 y.o. year old female who sees Edman Marsa PARAS, DO for primary care. I reached out to Joan Almarie Brain by phone today to offer complex care management services.  [name] was given information about Complex Care Management services today including:   The Complex Care Management services include support from the care team which includes your Nurse Care Manager, Clinical Social Worker, or Pharmacist.  The Complex Care Management team is here to help remove barriers to the health concerns and goals most important to you. Complex Care Management services are voluntary, and the patient may decline or stop services at any time by request to their care team member.   Complex Care Management Consent Status: Patient agreed to services and verbal consent obtained.   Follow up plan:  Telephone appointment with complex care management team member scheduled for:  04/16/2024  Encounter Outcome:  Patient Scheduled  Jeoffrey Buffalo , RMA     [place]  [facility] Guide  Direct Dial: [phone]  Website: [email or web address]

## 2024-04-14 NOTE — Telephone Encounter (Signed)
 Tried calling patient no answer or VM , voice mail box not set up yet on daughters phone.

## 2024-04-14 NOTE — Telephone Encounter (Signed)
 Thank you Georgia .  To [facility] Clinical team / E2C2 if patient's daughter is reached again and more information is to be shared.   Okay to provide list of locations she can contact to pursue further assistance.  [facility] [place] Address: [address], [place], [place] [address] Phone: [phone]  [facility] [phone]  [facility] [place]  [phone]  Marsa Officer, DO [facility] 04/14/2024, 12:25 PM

## 2024-04-16 ENCOUNTER — Other Ambulatory Visit: Payer: MEDICAID

## 2024-04-16 NOTE — Patient Instructions (Signed)
 Visit Information  Thank you for taking time to visit with me today.   Tailored Plan Medicaid On September 16, 2022 some people on [place] Medicaid will move to a new kind of Medicaid health plan called a Tailored Plan. Tailored Plans cover your doctor visits, prescription drugs, and health care services.    If your [place] Medicaid will move to a Tailored Plan, you should have gotten a letter and welcome packet. If you're not sure, call your [place] Medicaid Enrollment Broker at [phone] and ask.  Check out these free materials, in Spanish and English, to learn more about your Tailored Plan: [email or web address]/Tailored-Plans/Toolkit  Tailored Care Management Services  TCM services are available to you now. If you are a Tailored Plan member or will be and want information about Tailored Care Management Services including rides to appointments and community and home services, call the Care Management provider for your county of residence:    [facility] [place], [place])  Member Services: [phone] Behavioral Health Crisis Line: [phone], [place], [place], [place], [place])  Member Services: [phone] Behavioral Health Crisis Line: 9158519204     Please call the Suicide and Crisis Lifeline: 988 call the [place]  National Suicide Prevention Lifeline: (704)581-5582 or TTY: (360)559-0160 TTY (641)189-4627) to talk to a trained counselor call 1-800-273-TALK (toll free, 24 hour hotline) call 911 if you are experiencing a Mental Health or Behavioral Health Crisis or need someone to talk to.  Care plan and visit instructions communicated with the patient verbally today. Patient agrees to receive a copy in MyChart. Active MyChart status and patient understanding of how to access instructions and care plan via MyChart confirmed with patient.     Thersia Hoar, HEDWIG, MHA [place]  [facility] Social Worker, Population Health [phone]

## 2024-04-16 NOTE — Patient Outreach (Signed)
 Referral received from Karamalegos, Alexander J, DO for assistance with care management needs. This patient is enrolled in (name of health plan) and has associated care management benefits. I reached out to the health plan today to refer the patient to the assigned health plan care management team member.   Interventions:  Val Farnam was advised to anticipate contact from the health plan for follow up about care management services and resources SW spoke with patients daughter and provided resources for rent and utilities. SW will contact [facility] ato get patients case production designer, theatre/television/film.  Thersia Hoar, HEDWIG, MHA [place]  [facility] Social Worker, Population Health [phone]

## 2024-04-19 ENCOUNTER — Telehealth: Payer: MEDICAID

## 2024-04-26 ENCOUNTER — Telehealth: Payer: MEDICAID

## 2024-05-24 ENCOUNTER — Ambulatory Visit: Payer: MEDICAID | Admitting: Family Medicine
